# Patient Record
Sex: Male | Born: 1950 | Race: White | Hispanic: No | Marital: Married | Smoking: Current some day smoker
Health system: Southern US, Community
[De-identification: ages and names within clinical notes are randomized; demographics above are authoritative.]

## PROBLEM LIST (undated history)

## (undated) DIAGNOSIS — R0602 Shortness of breath: Secondary | ICD-10-CM

## (undated) DIAGNOSIS — F419 Anxiety disorder, unspecified: Secondary | ICD-10-CM

## (undated) DIAGNOSIS — G4733 Obstructive sleep apnea (adult) (pediatric): Secondary | ICD-10-CM

## (undated) DIAGNOSIS — I10 Essential (primary) hypertension: Secondary | ICD-10-CM

## (undated) DIAGNOSIS — E785 Hyperlipidemia, unspecified: Secondary | ICD-10-CM

## (undated) DIAGNOSIS — F32A Depression, unspecified: Secondary | ICD-10-CM

## (undated) DIAGNOSIS — R739 Hyperglycemia, unspecified: Secondary | ICD-10-CM

## (undated) DIAGNOSIS — R1310 Dysphagia, unspecified: Secondary | ICD-10-CM

## (undated) DIAGNOSIS — M549 Dorsalgia, unspecified: Secondary | ICD-10-CM

## (undated) DIAGNOSIS — R7303 Prediabetes: Secondary | ICD-10-CM

## (undated) DIAGNOSIS — K219 Gastro-esophageal reflux disease without esophagitis: Secondary | ICD-10-CM

## (undated) DIAGNOSIS — G4730 Sleep apnea, unspecified: Secondary | ICD-10-CM

## (undated) HISTORY — PX: HERNIA REPAIR: SHX51

## (undated) HISTORY — DX: Prediabetes: R73.03

## (undated) HISTORY — DX: Hyperglycemia, unspecified: R73.9

## (undated) HISTORY — DX: Gastro-esophageal reflux disease without esophagitis: K21.9

## (undated) HISTORY — DX: Obstructive sleep apnea (adult) (pediatric): G47.33

## (undated) HISTORY — DX: Dorsalgia, unspecified: M54.9

## (undated) HISTORY — PX: OTHER SURGICAL HISTORY: SHX169

## (undated) HISTORY — DX: Dysphagia, unspecified: R13.10

## (undated) HISTORY — DX: Hyperlipidemia, unspecified: E78.5

## (undated) HISTORY — DX: Depression, unspecified: F32.A

## (undated) HISTORY — DX: Sleep apnea, unspecified: G47.30

## (undated) HISTORY — DX: Essential (primary) hypertension: I10

## (undated) HISTORY — DX: Shortness of breath: R06.02

## (undated) HISTORY — DX: Anxiety disorder, unspecified: F41.9

---

## 2009-04-29 ENCOUNTER — Emergency Department (HOSPITAL_COMMUNITY): Admission: EM | Admit: 2009-04-29 | Discharge: 2009-04-30 | Payer: Self-pay | Admitting: Emergency Medicine

## 2011-03-08 LAB — POCT I-STAT, CHEM 8
Glucose, Bld: 111 mg/dL — ABNORMAL HIGH (ref 70–99)
HCT: 47 % (ref 39.0–52.0)
Hemoglobin: 16 g/dL (ref 13.0–17.0)
Potassium: 3 mEq/L — ABNORMAL LOW (ref 3.5–5.1)

## 2011-09-30 DEATH — deceased

## 2014-09-12 ENCOUNTER — Ambulatory Visit (INDEPENDENT_AMBULATORY_CARE_PROVIDER_SITE_OTHER): Payer: BC Managed Care – PPO | Admitting: Neurology

## 2014-09-12 ENCOUNTER — Encounter: Payer: Self-pay | Admitting: Neurology

## 2014-09-12 VITALS — BP 127/80 | HR 77 | Ht 72.0 in | Wt 247.0 lb

## 2014-09-12 DIAGNOSIS — E669 Obesity, unspecified: Secondary | ICD-10-CM

## 2014-09-12 DIAGNOSIS — G4733 Obstructive sleep apnea (adult) (pediatric): Secondary | ICD-10-CM

## 2014-09-12 DIAGNOSIS — R351 Nocturia: Secondary | ICD-10-CM

## 2014-09-12 DIAGNOSIS — G478 Other sleep disorders: Secondary | ICD-10-CM

## 2014-09-12 NOTE — Progress Notes (Signed)
Subjective:    Patient ID: Roy Avila is a 63 y.o. male.  HPI    Huston FoleySaima Avila Krisher, MD, PhD [facility] [address], [address] [address] [place], [place] [address]  Dear Roy LongsJoseph,  Also your patient, Roy EarlJeffrey Avila, upon your kind request in my neurologic clinic today for initial consultation of his sleep disorder, in particular, concern for underlying obstructive sleep apnea. The patient is unaccompanied today. As you know, Mr. Roy Avila is a 63 year old right-handed gentleman with an underlying medical history of obesity, HTN, GER, and anxiety, who was noted to have apneic spells during his colonoscopy procedure recently, earlier this month. He does report a long-standing history of loud snoring. He does not always wake up rested and has some daytime tiredness and sometimes takes a nap. He has a family history of loud snoring. He has a cousin with long-standing history of sleep apnea on a CPAP machine. He is somewhat reluctant to consider a CPAP machine himself but would be open to treatment for his sleep apnea. He has nocturia once or twice per night. He goes to bed around 11:30 and usually falls asleep quickly. He wakes up between 7 and 9 AM. He denies any morning headaches, restless leg symptoms or twitching in his sleep. He has occasional reflux symptoms and rare anxiety issues for which he takes as needed Xanax. He would like to consider home sleep test over a lab attended sleep study. His Epworth sleepiness score is 9 today. He is trying to lose weight and has been able to lose several pounds so far.  His Past Medical History Is Significant For: Past Medical History  Diagnosis Date  . Sleep apnea   . Hypertension     His Past Surgical History Is Significant For: Past Surgical History  Procedure Laterality Date  . None      His Family History Is Significant For: Family History  Problem Relation Age of Onset  . Breast cancer Mother   . Hypertension Mother   .  Heart disease Father   . Hypertension Father     His Social History Is Significant For: History   Social History  . Marital Status: Married    Spouse Name: N/A    Number of Children: 2  . Years of Education: college 4   Occupational History  . Retired   . Sales    Social History Main Topics  . Smoking status: Current Some Day Smoker -- 1.00 packs/day for 30 years    Types: Cigarettes  . Smokeless tobacco: Never Used  . Alcohol Use: Yes     Comment: socially  . Drug Use: No  . Sexual Activity: None   Other Topics Concern  . None   Social History Narrative  . None    His Allergies Are:  No Known Allergies:   His Current Medications Are:  Outpatient Encounter Prescriptions as of 09/12/2014  Medication Sig  . ALPRAZolam (XANAX) 0.5 MG tablet Take 0.5 mg by mouth as needed.  Marland Kitchen. amLODipine (NORVASC) 5 MG tablet Take 5 mg by mouth daily.  Marland Kitchen. BENICAR HCT 40-25 MG per tablet Take 1 tablet by mouth daily.  Marland Kitchen. doxycycline (VIBRAMYCIN) 100 MG capsule Take 100 mg by mouth as needed.  Marland Kitchen. NEXIUM 40 MG capsule Take 40 mg by mouth daily.  Marland Kitchen. ZOSTAVAX [id number] UNT/0.65ML injection Inject 0.05 Units into the skin once.  :  Review of Systems:  Out of a complete 14 point review of systems, all are reviewed  and negative with the exception of these symptoms as listed below:   Review of Systems  Constitutional:       Snoring, ringing of ears.    Objective:  Neurologic Exam  Physical Exam Physical Examination:   Filed Vitals:   09/12/14 1112  BP: 127/80  Pulse: 77    General Examination: The patient is a very pleasant 63 y.o. male in no acute distress. He appears well-developed and well-nourished and well groomed.   HEENT: Normocephalic, atraumatic, pupils are equal, round and reactive to light and accommodation. Funduscopic exam is normal with sharp disc margins noted. Extraocular tracking is good without limitation to gaze excursion or nystagmus noted. Normal smooth pursuit is  noted. Hearing is grossly intact. Tympanic membranes are clear bilaterally. Face is symmetric with normal facial animation and normal facial sensation. Speech is clear with no dysarthria noted. There is no hypophonia. There is no lip, neck/head, jaw or voice tremor. Neck is supple with full range of passive and active motion. There are no carotid bruits on auscultation. Oropharynx exam reveals: mild mouth dryness, adequate dental hygiene and marked airway crowding, due to narrow airway, thick soft palate, tonsils in place, and redundant soft palate. I could not visualize the tip of his uvula. Mallampati is class III. Tongue protrudes centrally and palate elevates symmetrically. Tonsils are 1+ in size. Neck size is 18 3/8 inches. He has a Moderate overbite. Nasal inspection reveals no significant nasal mucosal bogginess or redness and no septal deviation.   Chest: Clear to auscultation without wheezing, rhonchi or crackles noted.  Heart: S1+S2+0, regular and normal without murmurs, rubs or gallops noted.   Abdomen: Soft, non-tender and non-distended with normal bowel sounds appreciated on auscultation.  Extremities: There is no pitting edema in the distal lower extremities bilaterally. Pedal pulses are intact.  Skin: Warm and dry without trophic changes noted. There are no varicose veins.  Musculoskeletal: exam reveals no obvious joint deformities, tenderness or joint swelling or erythema.   Neurologically:  Mental status: The patient is awake, alert and oriented in all 4 spheres. His immediate and remote memory, attention, language skills and fund of knowledge are appropriate. There is no evidence of aphasia, agnosia, apraxia or anomia. Speech is clear with normal prosody and enunciation. Thought process is linear. Mood is normal and affect is normal.  Cranial nerves II - XII are as described above under HEENT exam. In addition: shoulder shrug is normal with equal shoulder height noted. Motor exam:  Normal bulk, strength and tone is noted. There is no drift, tremor or rebound. Romberg is negative. Reflexes are 2+ throughout. Babinski: Toes are flexor bilaterally. Fine motor skills and coordination: intact with normal finger taps, normal hand movements, normal rapid alternating patting, normal foot taps and normal foot agility.  Cerebellar testing: No dysmetria or intention tremor on finger to nose testing. Heel to shin is unremarkable bilaterally. There is no truncal or gait ataxia.  Sensory exam: intact to light touch, pinprick, vibration, temperature sense in the upper and lower extremities.  Gait, station and balance: He stands easily. No veering to one side is noted. No leaning to one side is noted. Posture is age-appropriate and stance is narrow based. Gait shows normal stride length and normal pace. No problems turning are noted. He turns en bloc. Tandem walk is unremarkable. Intact toe and heel stance is noted.               Assessment and Plan:   In summary, Demba  Roy Avila is a very pleasant 63 y.o.-year old male  with an underlying medical history of obesity, HTN, GER, and anxiety, who reports a long-standing history of loud snoring, nonrestorative sleep, some daytime tiredness, nocturia and witnessed apneic spells. His history and physical exam are highly concerning for obstructive sleep apnea (OSA). I had a long chat with the patient about my findings and the diagnosis of OSA, its prognosis and treatment options. We talked about medical treatments, surgical interventions and non-pharmacological approaches. I explained in particular the risks and ramifications of untreated moderate to severe OSA, especially with respect to developing cardiovascular disease down the Road, including congestive heart failure, difficult to treat hypertension, cardiac arrhythmias, or stroke. Even type 2 diabetes has, in part, been linked to untreated OSA. Symptoms of untreated OSA include daytime sleepiness, memory  problems, mood irritability and mood disorder such as depression and anxiety, lack of energy, as well as recurrent headaches, especially morning headaches. We talked about trying to maintain a healthy lifestyle in general, as well as the importance of weight control. I encouraged the patient to eat healthy, exercise daily and keep well hydrated, to keep a scheduled bedtime and wake time routine, to not skip any meals and eat healthy snacks in between meals. I advised the patient not to drive when feeling sleepy. I recommended the following at this time: sleep study, and the patient would like to proceed with a home sleep test. While I would prefer a lab attended sleep study, we will certainly accommodate him and I described a home sleep test procedure to him. I explained the sleep test procedure to the patient and also outlined possible surgical and non-surgical treatment options of OSA, including the use of a custom-made dental device (which would require a referral to a specialist dentist or oral surgeon), upper airway surgical options, such as pillar implants, radiofrequency surgery, tongue base surgery, and UPPP (which would involve a referral to an ENT surgeon). Rarely, jaw surgery such as mandibular advancement may be considered.  I also explained the CPAP treatment option to the patient, who indicated that he would be reluctant to try CPAP but not unwilling to try CPAP if the need arises. he would prefer an oral appliance if possible.  I explained the importance of being compliant with PAP treatment, not only for insurance purposes but primarily to improve His symptoms, and for the patient's long term health benefit, including to reduce His cardiovascular risks. I answered all  his  questions today and the patient was in agreement. I would like to see  him  back after the sleep study is completed and encouraged  him  to call with any interim questions, concerns, problems or updates.   Thank you very  much for allowing me to participate in the care of this nice patient. If I can be of any further assistance to you please do not hesitate to call me at [phone].  Sincerely,   Huston FoleySaima Jody Aguinaga, MD, PhD

## 2014-09-12 NOTE — Patient Instructions (Signed)
Based on your symptoms and your exam I believe you are at significant risk for obstructive sleep apnea or OSA, and I think we should proceed with a home sleep study to determine whether you do or do not have OSA and how severe it is. If you have more than mild OSA, I want you to consider treatment with CPAP. Please remember, the risks and ramifications of moderate to severe obstructive sleep apnea or OSA are: Cardiovascular disease, including congestive heart failure, stroke, difficult to control hypertension, arrhythmias, and even type 2 diabetes has been linked to untreated OSA. Sleep apnea causes disruption of sleep and sleep deprivation in most cases, which, in turn, can cause recurrent headaches, problems with memory, mood, concentration, focus, and vigilance. Most people with untreated sleep apnea report excessive daytime sleepiness, which can affect their ability to drive. Please do not drive if you feel sleepy.  I will see you back after your sleep study to go over the test results and where to go from there. We will call you after your sleep study and to set up an appointment at the time.

## 2014-09-13 ENCOUNTER — Telehealth: Payer: Self-pay | Admitting: *Deleted

## 2014-09-13 NOTE — Telephone Encounter (Signed)
Ofv note faxed per Dr. Teofilo PodAthar's request.  Successful transmission.

## 2014-09-30 ENCOUNTER — Ambulatory Visit (INDEPENDENT_AMBULATORY_CARE_PROVIDER_SITE_OTHER): Payer: BC Managed Care – PPO | Admitting: Neurology

## 2014-09-30 DIAGNOSIS — G4733 Obstructive sleep apnea (adult) (pediatric): Secondary | ICD-10-CM

## 2014-09-30 DIAGNOSIS — G478 Other sleep disorders: Secondary | ICD-10-CM

## 2014-09-30 DIAGNOSIS — R351 Nocturia: Secondary | ICD-10-CM

## 2014-09-30 DIAGNOSIS — E669 Obesity, unspecified: Secondary | ICD-10-CM

## 2014-09-30 NOTE — Sleep Study (Signed)
Please see the scanned sleep study interpretation located in the Procedure tab within the Chart Review section. 

## 2014-10-04 ENCOUNTER — Telehealth: Payer: Self-pay | Admitting: Neurology

## 2014-10-04 DIAGNOSIS — G4733 Obstructive sleep apnea (adult) (pediatric): Secondary | ICD-10-CM

## 2014-10-04 NOTE — Telephone Encounter (Signed)
Please call and notify the patient that the recent home sleep test did suggest the diagnosis of moderate obstructive sleep apnea and that I recommend treatment for this in the form of CPAP. I will request an overnight sleep study for proper titration and mask fitting. Please explain to patient and arrange for a CPAP titration study. I have placed an order in the chart. Thanks, Abrahim Sargent, MD, PhD [facility] ([facility])  

## 2014-10-22 NOTE — Telephone Encounter (Signed)
Contacted patient and informed him of his sleep study results.  Patient scheduled his in lab CPAP titration study for Wednesday January 13th at 09:30 pm.  Patient had previously been mailed a copy of the results.

## 2014-12-11 ENCOUNTER — Ambulatory Visit (INDEPENDENT_AMBULATORY_CARE_PROVIDER_SITE_OTHER): Payer: BLUE CROSS/BLUE SHIELD | Admitting: Neurology

## 2014-12-11 VITALS — BP 143/87

## 2014-12-11 DIAGNOSIS — G4761 Periodic limb movement disorder: Secondary | ICD-10-CM

## 2014-12-11 DIAGNOSIS — G4733 Obstructive sleep apnea (adult) (pediatric): Secondary | ICD-10-CM

## 2014-12-11 NOTE — Sleep Study (Signed)
Please see the scanned sleep study interpretation located in the Procedure tab within the Chart Review section. 

## 2014-12-18 ENCOUNTER — Telehealth: Payer: Self-pay | Admitting: Neurology

## 2014-12-18 DIAGNOSIS — G4733 Obstructive sleep apnea (adult) (pediatric): Secondary | ICD-10-CM

## 2014-12-19 ENCOUNTER — Encounter: Payer: Self-pay | Admitting: *Deleted

## 2014-12-19 ENCOUNTER — Encounter: Payer: Self-pay | Admitting: Neurology

## 2014-12-19 NOTE — Telephone Encounter (Signed)
Patient was contacted and informed that the results of his CPAP titration study.  The patient was advised that CPAP therapy was effective in treating his obstructive sleep apnea.  Informed that treatment with CPAP use at home was advised.  The patient was in agreement and was [facility].  Patient was mailed a copy of his report.   Patient instructed to contact our office 6-8 weeks post set up to schedule a follow up appointment.

## 2014-12-19 NOTE — Telephone Encounter (Signed)
Please call and inform patient that I have entered an order for treatment with PAP. He did well during the latest sleep study with CPAP. We will, therefore, arrange for a machine for home use through a DME (durable medical equipment) company of His choice; and I will see the patient back in follow-up in about 6 weeks. Please also explain to the patient that I will be looking out for compliance data downloaded from the machine, which can be done remotely through a modem at times or stored on an SD card in the back of the machine. At the time of the followup appointment we will discuss sleep study results and how it is going with PAP treatment at home. Please advise patient to bring His machine at the time of the visit; at least for the first visit, even though this is cumbersome. Bringing the machine for every visit after that may not be needed, but often helps for the first visit. Please also make sure, the patient has a follow-up appointment with me in about 6 weeks from the setup date, thanks.   Nalia Honeycutt, MD, PhD [facility] ([facility]  

## 2015-02-10 ENCOUNTER — Encounter: Payer: Self-pay | Admitting: Neurology

## 2016-08-12 LAB — TSH: TSH: 2.55 u[IU]/mL (ref <=5.90)

## 2016-08-12 LAB — HEPATIC FUNCTION PANEL
ALK PHOS: 74 U/L (ref 25–125)
ALT: 78 U/L — AB (ref 10–40)
AST: 56 U/L — AB (ref 14–40)
Bilirubin, Total: 0.6 mg/dL

## 2016-08-12 LAB — BASIC METABOLIC PANEL
BUN: 12 mg/dL (ref 4–21)
Creatinine: 0.8 mg/dL (ref <=1.3)
GLUCOSE: 99 mg/dL
POTASSIUM: 4.3 mmol/L (ref 3.4–5.3)
Sodium: 142 mmol/L (ref 137–147)

## 2016-08-12 LAB — LIPID PANEL
HDL: 31 mg/dL — AB (ref 35–70)
LDL Cholesterol: 114 mg/dL
LDl/HDL Ratio: 3.7
TRIGLYCERIDES: 227 mg/dL — AB (ref 40–160)

## 2016-08-12 LAB — CBC AND DIFFERENTIAL
HCT: 44 % (ref 41–53)
HEMOGLOBIN: 14.4 g/dL (ref 13.5–17.5)
PLATELETS: 137 10*3/uL — AB (ref 150–399)
WBC: 8.6 10*3/mL

## 2016-08-12 LAB — HEMOGLOBIN A1C: Hemoglobin A1C: 6.3

## 2016-08-12 LAB — PSA: PSA: 0.26

## 2016-12-30 DIAGNOSIS — H6521 Chronic serous otitis media, right ear: Secondary | ICD-10-CM | POA: Insufficient documentation

## 2016-12-30 DIAGNOSIS — H90A31 Mixed conductive and sensorineural hearing loss, unilateral, right ear with restricted hearing on the contralateral side: Secondary | ICD-10-CM | POA: Insufficient documentation

## 2017-01-20 ENCOUNTER — Encounter: Payer: Self-pay | Admitting: Family Medicine

## 2017-01-20 ENCOUNTER — Ambulatory Visit (INDEPENDENT_AMBULATORY_CARE_PROVIDER_SITE_OTHER): Payer: Self-pay | Admitting: Family Medicine

## 2017-01-20 VITALS — BP 130/78 | HR 74 | Temp 98.1°F | Ht 72.0 in | Wt 256.6 lb

## 2017-01-20 DIAGNOSIS — I10 Essential (primary) hypertension: Secondary | ICD-10-CM

## 2017-01-20 DIAGNOSIS — R7301 Impaired fasting glucose: Secondary | ICD-10-CM

## 2017-01-20 DIAGNOSIS — E669 Obesity, unspecified: Secondary | ICD-10-CM

## 2017-01-20 DIAGNOSIS — R351 Nocturia: Secondary | ICD-10-CM

## 2017-01-20 DIAGNOSIS — R7989 Other specified abnormal findings of blood chemistry: Secondary | ICD-10-CM

## 2017-01-20 DIAGNOSIS — E1159 Type 2 diabetes mellitus with other circulatory complications: Secondary | ICD-10-CM | POA: Insufficient documentation

## 2017-01-20 DIAGNOSIS — K219 Gastro-esophageal reflux disease without esophagitis: Secondary | ICD-10-CM

## 2017-01-20 DIAGNOSIS — F17200 Nicotine dependence, unspecified, uncomplicated: Secondary | ICD-10-CM

## 2017-01-20 DIAGNOSIS — E785 Hyperlipidemia, unspecified: Secondary | ICD-10-CM

## 2017-01-20 DIAGNOSIS — G4733 Obstructive sleep apnea (adult) (pediatric): Secondary | ICD-10-CM

## 2017-01-20 DIAGNOSIS — R059 Cough, unspecified: Secondary | ICD-10-CM

## 2017-01-20 DIAGNOSIS — R945 Abnormal results of liver function studies: Secondary | ICD-10-CM

## 2017-01-20 DIAGNOSIS — F419 Anxiety disorder, unspecified: Secondary | ICD-10-CM

## 2017-01-20 DIAGNOSIS — R05 Cough: Secondary | ICD-10-CM

## 2017-01-20 DIAGNOSIS — E66811 Obesity, class 1: Secondary | ICD-10-CM

## 2017-01-20 MED ORDER — PREDNISONE 5 MG PO TABS: ORAL_TABLET | ORAL | 0 refills | Status: DC

## 2017-01-20 MED ORDER — AZITHROMYCIN 250 MG PO TABS: ORAL_TABLET | ORAL | 0 refills | Status: DC

## 2017-01-20 MED ORDER — GUAIFENESIN-CODEINE 100-10 MG/5ML PO SYRP: 10.0000 mL | ORAL_SOLUTION | Freq: Every evening | ORAL | 0 refills | Status: DC | PRN

## 2017-01-20 NOTE — Progress Notes (Signed)
Pre visit review using our clinic review tool, if applicable. No additional management support is needed unless otherwise documented below in the visit note. 

## 2017-01-20 NOTE — Progress Notes (Signed)
Roy Avila is a 66 y.o. male is here to [facility].   History of Present Illness:    1. Cough: Patient complains of productive cough with sputum described as thick and green, associated with wheezing. Symptoms began 5 days ago. Symptoms have been gradually worsening since that time.The cough is aggravated by reclining position. Associated symptoms include: postnasal drip and wheezing. Patient does have a history of smoking. Patient has not had a previous chest x-ray.     2. Smoker. Not every day.   3. Obstructive sleep apnea syndrome. Compliant.    Health Maintenance Due  Topic Date Due  . TETANUS/TDAP  05/01/1970     PMHx, SurgHx, SocialHx, Medications, and Allergies were reviewed in the Visit Navigator and updated as appropriate.    Past Medical History:  Diagnosis Date  . Hypertension   . Sleep apnea     Past Surgical History:  Procedure Laterality Date  . None      Family History  Problem Relation Age of Onset  . Breast cancer Mother   . Hypertension Mother   . Heart disease Father   . Hypertension Father     Social History  Substance Use Topics  . Smoking status: Current Some Day Smoker    Years: 30.00    Types: Cigarettes  . Smokeless tobacco: Never Used     Comment: Social smoker, 1 pack per week  . Alcohol use Yes     Comment: socially     Current Medications and Allergies:    Current Outpatient Prescriptions:  .  ALPRAZolam (XANAX) 0.5 MG tablet, Take 0.5 mg by mouth as needed., Disp: , Rfl:  .  amLODipine (NORVASC) 5 MG tablet, Take 5 mg by mouth daily., Disp: , Rfl:  .  BENICAR HCT 40-25 MG per tablet, Take 1 tablet by mouth daily., Disp: , Rfl:  .  NEXIUM 40 MG capsule, Take 40 mg by mouth daily., Disp: , Rfl:    No Known Allergies    Patient Information Form: Screening and ROS    Do you feel safe in relationships? yes PHQ-2: negative  Review of Systems  General:  Negative for unexplained weight loss, fever Skin:  Negative for new or changing mole, sore that won't heal HEENT: Negative for trouble hearing, trouble seeing, ringing in ears, mouth sores, hoarseness, change in voice, dysphagia CV:  Negative for chest pain, dyspnea, edema, palpitations Resp: Negative for cough, dyspnea, hemoptysis GI: Negative for nausea, vomiting, diarrhea, constipation, abdominal pain, melena, hematochezia GU: Negative for dysuria, incontinence, urinary hesitance, hematuria, vaginal or penile discharge, polyuria, sexual difficulty, lumps in testicle or breasts MSK: Negative for muscle cramps or aches, joint pain or swelling Neuro: Negative for headaches, weakness, numbness, dizziness, passing out/fainting Psych: Negative for depression, anxiety, memory problems   Vitals:   Vitals:   01/20/17 1503  BP: 130/78  Pulse: 74  Temp: 98.1 F (36.7 C)  TempSrc: Oral  SpO2: 96%  Weight: 256 lb 9.6 oz (116.4 kg)  Height: 6' (1.829 m)     Body mass index is 34.8 kg/m.   Physical Exam:     General: Alert, cooperative, appears stated age and no distress.  HEENT:  Normocephalic, without obvious abnormality, atraumatic. Conjunctivae/corneas clear. PERRL, EOM's intact. Normal TM's and external ear canals both ears. Nares normal. Septum midline. Mucosa normal. No drainage or sinus tenderness. Lips, mucosa, and tongue normal; teeth and gums normal.  Lungs: Central lung congestion, cough, diffuse wheeze bilaterally.  Heart:: Regular rate  and rhythm, S1, S2 normal, no murmur, click, rub or gallop.  Abdomen: Soft, non-tender; bowel sounds normal; no masses,  no organomegaly.  Extremities: Extremities normal, atraumatic, no cyanosis or edema.  Pulses: 2+ and symmetric.  Skin: Skin color, texture, turgor normal. No rashes or lesions.  Neurologic: Alert and oriented X 3, normal strength and tone. Normal symmetric. reflexes. Normal coordination and gait.  Psych: Alert,oriented, in NAD with a full range of affect, normal behavior  and no psychotic features      Assessment and Plan:    Roy Avila was seen today for establish care and cough.  Diagnoses and all orders for this visit:  Gastroesophageal reflux disease without esophagitis  Cough Comments: c/w bronchitis in smoker.  Orders: -     predniSONE (DELTASONE) 5 MG tablet; 6-5-4-3-2-1-off -     guaiFENesin-codeine (CHERATUSSIN AC) 100-10 MG/5ML syrup; Take 10 mLs by mouth at bedtime as needed for cough or congestion. -     azithromycin (ZITHROMAX Z-PAK) 250 MG tablet; Take 2 tablets ( total of 500 mg) PO on day 1, then 1 tablet ( total of 250 mg) PO q24 x 4 days. (SAFETY NET)  HTN, goal below 140/80  Obesity (BMI 30.0-34.9)  Anxiety  Smoker       - Smoking cessation instruction/counseling given: counseled patient on the dangers of tobacco use, advised patient to stop smoking, and reviewed strategies to maximize success.  Obstructive sleep apnea syndrome   . Reviewed expectations re: course of current medical issues. . Discussed self-management of symptoms. . Outlined signs and symptoms indicating need for more acute intervention. . Patient verbalized understanding and all questions were answered. . See orders for this visit as documented in the electronic medical record. . Patient received an After Visit Summary.  Records requested if needed. I spent 30 minutes with this patient, greater than 50% was face-to-face time counseling regarding the above diagnoses.    Helane RimaErica Tristin Vandeusen, D.O. [place] [place] 01/20/2017   Follow-up: 3-6 months.  Meds ordered this encounter  Medications  . azithromycin (ZITHROMAX Z-PAK) 250 MG tablet    Sig: Take 2 tablets ( total of 500 mg) PO on day 1, then 1 tablet ( total of 250 mg) PO q24 x 4 days.    Dispense:  6 each    Refill:  0  . predniSONE (DELTASONE) 5 MG tablet    Sig: 6-5-4-3-2-1-off    Dispense:  21 tablet    Refill:  0  . guaiFENesin-codeine (CHERATUSSIN AC) 100-10 MG/5ML syrup    Sig:  Take 10 mLs by mouth at bedtime as needed for cough or congestion.    Dispense:  120 mL    Refill:  0   Medications Discontinued During This Encounter  Medication Reason  . doxycycline (VIBRAMYCIN) 100 MG capsule Error  . ZOSTAVAX [id number] UNT/0.65ML injection Error   No orders of the defined types were placed in this encounter.

## 2017-01-22 DIAGNOSIS — G4733 Obstructive sleep apnea (adult) (pediatric): Secondary | ICD-10-CM

## 2017-01-22 DIAGNOSIS — E669 Obesity, unspecified: Secondary | ICD-10-CM | POA: Insufficient documentation

## 2017-01-22 DIAGNOSIS — Z9989 Dependence on other enabling machines and devices: Secondary | ICD-10-CM

## 2017-01-22 HISTORY — DX: Obstructive sleep apnea (adult) (pediatric): G47.33

## 2017-01-27 ENCOUNTER — Encounter: Payer: Self-pay | Admitting: Family Medicine

## 2017-01-27 DIAGNOSIS — R7301 Impaired fasting glucose: Secondary | ICD-10-CM | POA: Insufficient documentation

## 2017-01-27 DIAGNOSIS — R351 Nocturia: Secondary | ICD-10-CM | POA: Insufficient documentation

## 2017-01-27 DIAGNOSIS — R7989 Other specified abnormal findings of blood chemistry: Secondary | ICD-10-CM | POA: Insufficient documentation

## 2017-01-27 DIAGNOSIS — R945 Abnormal results of liver function studies: Secondary | ICD-10-CM

## 2017-01-27 DIAGNOSIS — E785 Hyperlipidemia, unspecified: Secondary | ICD-10-CM | POA: Insufficient documentation

## 2017-01-27 HISTORY — DX: Other specified abnormal findings of blood chemistry: R79.89

## 2017-01-27 HISTORY — DX: Abnormal results of liver function studies: R94.5

## 2017-03-07 ENCOUNTER — Other Ambulatory Visit: Payer: Self-pay

## 2017-03-07 MED ORDER — AMLODIPINE BESYLATE 5 MG PO TABS: 5.0000 mg | ORAL_TABLET | Freq: Every day | ORAL | 1 refills | Status: DC

## 2017-03-07 MED ORDER — ESOMEPRAZOLE MAGNESIUM 40 MG PO CPDR: 40.0000 mg | DELAYED_RELEASE_CAPSULE | Freq: Every day | ORAL | 1 refills | Status: DC

## 2017-06-27 ENCOUNTER — Ambulatory Visit (INDEPENDENT_AMBULATORY_CARE_PROVIDER_SITE_OTHER): Payer: Medicare Other

## 2017-06-27 ENCOUNTER — Encounter: Payer: Self-pay | Admitting: Family Medicine

## 2017-06-27 ENCOUNTER — Ambulatory Visit (INDEPENDENT_AMBULATORY_CARE_PROVIDER_SITE_OTHER): Payer: Medicare Other | Admitting: Family Medicine

## 2017-06-27 VITALS — BP 160/84 | HR 82 | Temp 97.9°F | Ht 72.0 in | Wt 257.4 lb

## 2017-06-27 DIAGNOSIS — R05 Cough: Secondary | ICD-10-CM | POA: Diagnosis not present

## 2017-06-27 DIAGNOSIS — R059 Cough, unspecified: Secondary | ICD-10-CM

## 2017-06-27 DIAGNOSIS — Z87891 Personal history of nicotine dependence: Secondary | ICD-10-CM | POA: Diagnosis not present

## 2017-06-27 DIAGNOSIS — R0989 Other specified symptoms and signs involving the circulatory and respiratory systems: Secondary | ICD-10-CM

## 2017-06-27 MED ORDER — AZITHROMYCIN 250 MG PO TABS: ORAL_TABLET | ORAL | 0 refills | Status: DC

## 2017-06-27 NOTE — Patient Instructions (Signed)
Please start antibiotic today- take two tablets today, then one a day until finished  For cough can use Delsym  Continue Mucinex  If you develop difficulty breathing or chest pain, please call 911.   If not better in 5-7 days, please notify office  You will get called about your xray results

## 2017-06-27 NOTE — Progress Notes (Signed)
Subjective:    Patient ID: Roy Avila, male    DOB: 11/09/1951, 66 y.o.   MRN: [id number]  HPI This is a 66 yo male who presents today with chest congestion x 1 week Having dark, thick sputum. No fever. A little SOB over last 2 days. Smokes only when he has a drink, 1-2 times a week. Has not been smoking recently. Some nasal congestion. Has had problems with right ear, sees Dr. Jenne PaneBates. Taking Ciprodex. Some drainage and pain. No headache or sore throat. Hearing wheezing intermittently. Is concerned about risk of lung cancer with his smoking history. Uses CPAP for OSA, sleeping well, cough not keeping him up.  Was recently on a cruise. Did well with excursions. Felt like he had a cold coming on prior to cruise, but then felt well on his trip.   Past Medical History:  Diagnosis Date  . Anxiety   . Hyperglycemia   . Hyperlipidemia   . Hypertension   . Sleep apnea    Past Surgical History:  Procedure Laterality Date  . HERNIA REPAIR    . none     Family History  Problem Relation Age of Onset  . Breast cancer Mother   . Hypertension Mother   . Heart disease Father   . Hypertension Father    Social History  Substance Use Topics  . Smoking status: Current Some Day Smoker    Years: 30.00    Types: Cigarettes  . Smokeless tobacco: Never Used     Comment: Social smoker, 1 pack per week  . Alcohol use Yes     Comment: socially      Review of Systems Per HPI    Objective:   Physical Exam  Constitutional: He is oriented to person, place, and time. He appears well-developed and well-nourished. No distress.  HENT:  Head: Normocephalic and atraumatic.  Right Ear: External ear normal.  Left Ear: Tympanic membrane, external ear and ear canal normal.  Nose: Mucosal edema and rhinorrhea present.  Mouth/Throat: Oropharynx is clear and moist.  Left canal mildly swollen and TM pale, thickened.   Eyes: Conjunctivae are normal.  Neck: Normal range of motion. Neck supple.    Cardiovascular: Normal rate, regular rhythm and normal heart sounds.   Pulmonary/Chest: Effort normal.  RLQ with rhonchi. No wheezes. Occasional moist cough.   Lymphadenopathy:    He has no cervical adenopathy.  Neurological: He is alert and oriented to person, place, and time.  Skin: Skin is warm and dry. He is not diaphoretic.  Psychiatric: He has a normal mood and affect. His behavior is normal. Judgment and thought content normal.  Vitals reviewed.     BP (!) 160/84 (BP Location: Right Arm, Patient Position: Sitting, Cuff Size: Normal)   Pulse 82   Temp 97.9 F (36.6 C) (Oral)   Ht 6' (1.829 m)   Wt 257 lb 6.4 oz (116.8 kg)   SpO2 95%   BMI 34.91 kg/m  Wt Readings from Last 3 Encounters:  06/27/17 257 lb 6.4 oz (116.8 kg)  01/20/17 256 lb 9.6 oz (116.4 kg)  09/12/14 247 lb (112 kg)   BP Readings from Last 3 Encounters:  06/27/17 (!) 160/84  01/20/17 130/78  12/11/14 (!) 143/87       Assessment & Plan:  1. Cough - concern for CAP with recent cruise and smoking history -RTC/ED precautions reviewed.  - DG Chest 2 View; Future - azithromycin (ZITHROMAX) 250 MG tablet; Take 2 tabs PO x 1  dose, then 1 tab PO QD x 4 days  Dispense: 6 tablet; Refill: 0  2. Chest congestion - DG Chest 2 View; Future - azithromycin (ZITHROMAX) 250 MG tablet; Take 2 tabs PO x 1 dose, then 1 tab PO QD x 4 days  Dispense: 6 tablet; Refill: 0  3. Smoking history - has not smoked in several weeks, encouraged him to quit. He is pleased with efforts so far.  - DG Chest 2 View; Future - azithromycin (ZITHROMAX) 250 MG tablet; Take 2 tabs PO x 1 dose, then 1 tab PO QD x 4 days  Dispense: 6 tablet; Refill: 0 - discussed limits of CXR for lung cancer screening and encouraged him to discuss screening chest CT for lung cancer.   Olean Reeeborah Gessner, FNP-BC  [facility] [place], [facility]  06/27/2017 9:02 PM

## 2017-07-18 ENCOUNTER — Telehealth: Payer: Self-pay | Admitting: Family Medicine

## 2017-07-18 MED ORDER — OLMESARTAN MEDOXOMIL-HCTZ 40-25 MG PO TABS: 1.0000 | ORAL_TABLET | Freq: Every day | ORAL | 2 refills | Status: DC

## 2017-07-18 NOTE — Telephone Encounter (Signed)
Left message for patient to return call.

## 2017-07-18 NOTE — Telephone Encounter (Signed)
MEDICATION: BENICAR HCT 40-25 MG per tablet  PHARMACY:  [facility] - [place], [place] - [address] [phone] (Phone) [phone] (Fax)    IS THIS A 90 DAY SUPPLY :yes   IS PATIENT OUT OF MEDICTAION: yes  IF NOT; HOW MUCH IS LEFT: n/a  LAST APPOINTMENT DATE:06/27/17  NEXT APPOINTMENT DATE:n/a  OTHER COMMENTS:    **Let patient know to contact pharmacy at the end of the day to make sure medication is ready. **  ** Please notify patient to allow 48-72 hours to process**  **Encourage patient to contact the pharmacy for refills or they can request refills through [facility]**

## 2017-07-18 NOTE — Telephone Encounter (Signed)
Spoke with lady at [facility] and they said that that was not needed to verify Dr. Earlene Plater was PCP. Medication that was sent in has already been filled.

## 2017-07-18 NOTE — Telephone Encounter (Signed)
Clinical staff, please call [facility] just to verify that Dr. Earlene Plater is the PCP.

## 2017-07-18 NOTE — Telephone Encounter (Signed)
Per Dr. Joellyn Rued to refill.

## 2017-08-29 ENCOUNTER — Encounter: Payer: Self-pay | Admitting: Internal Medicine

## 2017-09-22 ENCOUNTER — Other Ambulatory Visit: Payer: Self-pay | Admitting: Family Medicine

## 2017-10-05 ENCOUNTER — Encounter: Payer: Self-pay | Admitting: Family Medicine

## 2017-10-05 ENCOUNTER — Ambulatory Visit (INDEPENDENT_AMBULATORY_CARE_PROVIDER_SITE_OTHER): Payer: Medicare Other | Admitting: Family Medicine

## 2017-10-05 VITALS — BP 116/74 | HR 75 | Temp 98.4°F | Ht 72.0 in | Wt 247.8 lb

## 2017-10-05 DIAGNOSIS — E7800 Pure hypercholesterolemia, unspecified: Secondary | ICD-10-CM | POA: Diagnosis not present

## 2017-10-05 DIAGNOSIS — F419 Anxiety disorder, unspecified: Secondary | ICD-10-CM | POA: Diagnosis not present

## 2017-10-05 DIAGNOSIS — Z0000 Encounter for general adult medical examination without abnormal findings: Secondary | ICD-10-CM

## 2017-10-05 DIAGNOSIS — R39198 Other difficulties with micturition: Secondary | ICD-10-CM | POA: Diagnosis not present

## 2017-10-05 DIAGNOSIS — R945 Abnormal results of liver function studies: Secondary | ICD-10-CM | POA: Diagnosis not present

## 2017-10-05 DIAGNOSIS — E785 Hyperlipidemia, unspecified: Secondary | ICD-10-CM

## 2017-10-05 DIAGNOSIS — R7301 Impaired fasting glucose: Secondary | ICD-10-CM

## 2017-10-05 DIAGNOSIS — E669 Obesity, unspecified: Secondary | ICD-10-CM | POA: Diagnosis not present

## 2017-10-05 DIAGNOSIS — R7989 Other specified abnormal findings of blood chemistry: Secondary | ICD-10-CM

## 2017-10-05 LAB — LIPID PANEL
Cholesterol: 180 mg/dL (ref 0–200)
HDL: 27.5 mg/dL — ABNORMAL LOW (ref >39.00)
LDL Cholesterol: 129 mg/dL — ABNORMAL HIGH (ref 0–99)
NonHDL: 152.67
Total CHOL/HDL Ratio: 7
Triglycerides: 120 mg/dL (ref 0.0–149.0)
VLDL: 24 mg/dL (ref 0.0–40.0)

## 2017-10-05 LAB — COMPREHENSIVE METABOLIC PANEL
ALT: 52 U/L (ref 0–53)
AST: 35 U/L (ref 0–37)
Albumin: 4 g/dL (ref 3.5–5.2)
Alkaline Phosphatase: 70 U/L (ref 39–117)
BUN: 11 mg/dL (ref 6–23)
CO2: 30 mEq/L (ref 19–32)
Calcium: 9.5 mg/dL (ref 8.4–10.5)
Chloride: 99 mEq/L (ref 96–112)
Creatinine, Ser: 0.86 mg/dL (ref 0.40–1.50)
GFR: 94.73 mL/min (ref >60.00)
Glucose, Bld: 109 mg/dL — ABNORMAL HIGH (ref 70–99)
Potassium: 3.8 mEq/L (ref 3.5–5.1)
Sodium: 138 mEq/L (ref 135–145)
Total Bilirubin: 0.9 mg/dL (ref 0.2–1.2)
Total Protein: 7.7 g/dL (ref 6.0–8.3)

## 2017-10-05 LAB — CBC WITH DIFFERENTIAL/PLATELET
Basophils Absolute: 0.1 10*3/uL (ref 0.0–0.1)
Basophils Relative: 0.6 % (ref 0.0–3.0)
Eosinophils Absolute: 1.1 10*3/uL — ABNORMAL HIGH (ref 0.0–0.7)
Eosinophils Relative: 9.7 % — ABNORMAL HIGH (ref 0.0–5.0)
HCT: 43.9 % (ref 39.0–52.0)
Hemoglobin: 14.6 g/dL (ref 13.0–17.0)
Lymphocytes Relative: 16.6 % (ref 12.0–46.0)
Lymphs Abs: 1.9 10*3/uL (ref 0.7–4.0)
MCHC: 33.1 g/dL (ref 30.0–36.0)
MCV: 95.6 fl (ref 78.0–100.0)
Monocytes Absolute: 1 10*3/uL (ref 0.1–1.0)
Monocytes Relative: 9.2 % (ref 3.0–12.0)
Neutro Abs: 7.3 10*3/uL (ref 1.4–7.7)
Neutrophils Relative %: 63.9 % (ref 43.0–77.0)
Platelets: 375 10*3/uL (ref 150.0–400.0)
RBC: 4.59 Mil/uL (ref 4.22–5.81)
RDW: 14.2 % (ref 11.5–15.5)
WBC: 11.4 10*3/uL — ABNORMAL HIGH (ref 4.0–10.5)

## 2017-10-05 LAB — PSA: PSA: 0.19 ng/mL (ref 0.10–4.00)

## 2017-10-05 LAB — TSH: TSH: 1.62 u[IU]/mL (ref 0.35–4.50)

## 2017-10-05 LAB — HEMOGLOBIN A1C: Hgb A1c MFr Bld: 6.2 % (ref 4.6–6.5)

## 2017-10-05 MED ORDER — ALPRAZOLAM 0.5 MG PO TABS: 0.5000 mg | ORAL_TABLET | Freq: Two times a day (BID) | ORAL | 0 refills | Status: DC | PRN

## 2017-10-05 NOTE — Progress Notes (Signed)
Subjective:    Roy Avila is a 66 y.o. male who presents today for his Complete Annual Exam. He feels well. He reports exercising but not regularly. He reports he is sleeping well.   Health Maintenance Due  Topic Date Due  . PNA vac Low Risk Adult (1 of 2 - PCV13) 04/30/2017   Prostate Symptoms Questionnaire: 1. Have you had the sensation of not emptying your bladder completely after you finished urinating? No. 2. Have you had to urinate again less than two hours after you finished urinating? No. 3. Have you found you stopped and started again several times when you urinated? No. 4. Have you found it difficult to postpone urination? No. 5. Have you had a weak urinary stream? Yes.   6. Have you had to push or strain to begin urination? No. 7. How many times did you most typically get up to urinate from the time you went to bed at night until the time you got up in the morning? < 2  PMHx, SurgHx, SocialHx, Medications, and Allergies were reviewed in the Visit Navigator and updated as appropriate.   Past Medical History:  Diagnosis Date  . Anxiety   . Hyperglycemia   . Hyperlipidemia   . Hypertension   . Sleep apnea     Past Surgical History:  Procedure Laterality Date  . HERNIA REPAIR    . none      Family History  Problem Relation Age of Onset  . Breast cancer Mother   . Hypertension Mother   . Heart disease Father   . Hypertension Father    Social History   Tobacco Use  . Smoking status: Current Some Day Smoker    Years: 30.00    Types: Cigarettes  . Smokeless tobacco: Never Used  . Tobacco comment: Social smoker, 1 pack per week  Substance Use Topics  . Alcohol use: Yes    Comment: socially  . Drug use: No   Review of Systems:   Pertinent items are noted in the HPI. Otherwise, ROS is negative.  Objective:    Vitals:   10/05/17 0857  BP: 116/74  Pulse: 75  Temp: 98.4 F (36.9 C)  SpO2: 95%   Body mass index is 33.61 kg/m.  General  Alert,  cooperative, no distress, appears stated age  Head:  Normocephalic, without obvious abnormality, atraumatic  Eyes:  PERRL, conjunctiva/corneas clear, EOM's intact, fundi benign, both eyes       Ears:  Right TM with blue tympanostomy tube.  Nose: Nares normal, septum midline, mucosa normal, no drainage or sinus tenderness  Throat: Lips, mucosa, and tongue normal; teeth and gums normal  Neck: Supple, symmetrical, trachea midline, no adenopathy; thyroid: no enlargement/tenderness/nodules; no carotid bruit or JVD  Back:   Symmetric, no curvature, ROM normal, no CVA tenderness  Lungs:   Clear to auscultation bilaterally, respirations unlabored  Chest Wall:  No tenderness or deformity  Heart:  Regular rate and rhythm, S1 and S2 normal, no murmur, rub or gallop  Abdomen:   Soft, non-tender, bowel sounds active all four quadrants, no masses, no organomegaly  Extremities: Extremities normal, atraumatic, no cyanosis or edema  Prostate : Not done.  Skin: Skin color, texture, turgor normal, no rashes or lesions  Lymph: Cervical, supraclavicular, and axillary nodes normal  Neurologic: CNII-XII grossly intact. Normal strength, sensation and reflexes throughout   AssessmentPlan:   Diagnoses and all orders for this visit:  Routine physical examination -     CBC  with Differential/Platelet  Pure hypercholesterolemia -     Lipid panel  Obesity (BMI 30.0-34.9) -     TSH  Impaired fasting glucose -     Hemoglobin A1c  Elevated LFTs -     Comprehensive metabolic panel  Decreased urine stream -     PSA  Anxiety -     ALPRAZolam (XANAX) 0.5 MG tablet; Take 1 tablet (0.5 mg total) 2 (two) times daily as needed by mouth for anxiety.   Patient Counseling: [x]   Nutrition: Stressed importance of moderation in sodium/caffeine intake, saturated fat and cholesterol, caloric balance, sufficient intake of fresh fruits, vegetables, and fiber  [x]   Stressed the importance of regular exercise.   []    Substance Abuse: Discussed cessation/primary prevention of tobacco, alcohol, or other drug use; driving or other dangerous activities under the influence; availability of treatment for abuse.   [x]   Injury prevention: Discussed safety belts, safety helmets, smoke detector, smoking near bedding or upholstery.   []   Sexuality: Discussed sexually transmitted diseases, partner selection, use of condoms, avoidance of unintended pregnancy  and contraceptive alternatives.   [x]   Dental health: Discussed importance of regular tooth brushing, flossing, and dental visits.  [x]   Health maintenance and immunizations reviewed. Please refer to Health maintenance section.    Helane RimaErica Melena Hayes, DO [place] [facility]

## 2017-10-07 DIAGNOSIS — E785 Hyperlipidemia, unspecified: Secondary | ICD-10-CM | POA: Insufficient documentation

## 2017-10-07 MED ORDER — FENOFIBRATE 48 MG PO TABS: 48.0000 mg | ORAL_TABLET | Freq: Every day | ORAL | 3 refills | Status: DC

## 2017-10-07 NOTE — Addendum Note (Signed)
Addended by: Helane RimaWALLACE, Laurisa Sahakian R on: 10/07/2017 02:38 PM   Modules accepted: Orders

## 2017-12-21 ENCOUNTER — Other Ambulatory Visit: Payer: Self-pay | Admitting: Family Medicine

## 2018-03-13 ENCOUNTER — Ambulatory Visit: Payer: Self-pay

## 2018-03-13 NOTE — Telephone Encounter (Signed)
Pt. Called with c/o "vertigo."  Reported when he woke up both yesterday AM and this AM, he noticed moderate to severe vertigo; described that room was "tilting."  Reported after he stands up, the dizziness subsides. Also reported he was bending over to reach something on lower grocery shelf, and became dizzy.  Dizziness subsided once he stood up.  Also reported he had an episode when he was just sitting, and thought he may have turned head too quickly.  Reported c/o sinus congestion, and intermittent headache. Stated he has a tube in his right ear.  Denied any drainage, fever or chills.  Appt. scheduled for AM with PCP.  Care advice per protocol.  Verb. Understanding; agrees with plan.     Reason for Disposition . [1] MODERATE dizziness (e.g., vertigo; feels very unsteady, interferes with normal activities) AND [2] has NOT been evaluated by physician for this  Answer Assessment - Initial Assessment Questions 1. DESCRIPTION: "Describe your dizziness."     Room is tilting; improves with sitting up  2. VERTIGO: "Do you feel like either you or the room is spinning or tilting?"      Room tilting 3. LIGHTHEADED: "Do you feel lightheaded?" (e.g., somewhat faint, woozy, weak upon standing)     Dizzy like I was off balance  4. SEVERITY: "How bad is it?"  "Can you walk?"   - MILD - Feels unsteady but walking normally.   - MODERATE - Feels very unsteady when walking, but not falling; interferes with normal activities (e.g., school, work) .   - SEVERE - Unable to walk without falling (requires assistance).     Moderate-severe 5. ONSET:  "When did the dizziness begin?"     2 days ago 6. AGGRAVATING FACTORS: "Does anything make it worse?" (e.g., standing, change in head position)     Bending over or moving too fast 7. CAUSE: "What do you think is causing the dizziness?"     unsure 8. RECURRENT SYMPTOM: "Have you had dizziness before?" If so, ask: "When was the last time?" "What happened that time?"  Have never had this before.  9. OTHER SYMPTOMS: "Do you have any other symptoms?" (e.g., headache, weakness, numbness, vomiting, earache)     Sinus congestion, intermittent headache; reported a tube in right ear ; denied fever/ chills  10. PREGNANCY: "Is there any chance you are pregnant?" "When was your last menstrual period?"      N/a  Protocols used: DIZZINESS - VERTIGO-A-AH

## 2018-03-14 ENCOUNTER — Encounter: Payer: Self-pay | Admitting: Family Medicine

## 2018-03-14 ENCOUNTER — Ambulatory Visit: Payer: Medicare Other | Admitting: Family Medicine

## 2018-03-14 VITALS — BP 130/72 | HR 78 | Temp 97.6°F | Ht 72.0 in | Wt 243.0 lb

## 2018-03-14 DIAGNOSIS — R42 Dizziness and giddiness: Secondary | ICD-10-CM

## 2018-03-14 MED ORDER — MECLIZINE HCL 25 MG PO TABS
25.0000 mg | ORAL_TABLET | Freq: Three times a day (TID) | ORAL | 0 refills | Status: DC | PRN
Start: 2018-03-14 — End: 2018-04-11

## 2018-03-14 NOTE — Telephone Encounter (Signed)
Printed off and put with ppw for app today.

## 2018-03-14 NOTE — Patient Instructions (Signed)

## 2018-03-14 NOTE — Progress Notes (Signed)
Roy Avila is a 67 y.o. male here for an acute visit.  History of Present Illness:   [name], CMA acting as scribe for Dr. Helane Rima.   Dizziness  The current episode started in the past 7 days. The problem occurs intermittently. The problem has been resolved. Pertinent negatives include no abdominal pain, chills, congestion, fever, headaches, neck pain or vomiting. He has tried drinking (feels better arter increasing fluid ) for the symptoms. The treatment provided moderate relief.   PMHx, SurgHx, SocialHx, Medications, and Allergies were reviewed in the Visit Navigator and updated as appropriate.  Current Medications:   .  ALPRAZolam (XANAX) 0.5 MG tablet, Take 1 tablet (0.5 mg total) 2 (two) times daily as needed by mouth for anxiety., Disp: 20 tablet, Rfl: 0 .  amLODipine (NORVASC) 5 MG tablet, TAKE 1 TABLET DAILY (NEED FOLLOW-UP FOR FURTHER REFILLS), Disp: 90 tablet, Rfl: 1 .  esomeprazole (NEXIUM) 40 MG capsule, TAKE 1 CAPSULE DAILY (NEED FOLLOW-UP FOR FURTHER REFILLS), Disp: 90 capsule, Rfl: 1 .  olmesartan-hydrochlorothiazide (BENICAR HCT) 40-25 MG tablet, Take 1 tablet by mouth daily., Disp: 90 tablet, Rfl: 2   Allergies  Allergen Reactions  . Clarithromycin Rash     Review of Systems:   Pertinent items are noted in the HPI. Otherwise, ROS is negative.  Vitals:   Vitals:   03/14/18 1018  BP: 130/72  Pulse: 78  Temp: 97.6 F (36.4 C)  TempSrc: Oral  SpO2: 96%  Weight: 243 lb (110.2 kg)  Height: 6' (1.829 m)     Body mass index is 32.96 kg/m.  Physical Exam:   Physical Exam  Constitutional: He is oriented to person, place, and time. He appears well-developed and well-nourished. No distress.  HENT:  Head: Normocephalic and atraumatic.  Right Ear: External ear normal.  Left Ear: External ear normal.  Nose: Nose normal.  Mouth/Throat: Oropharynx is clear and moist.  Right TM with blue tube. No issues.  Eyes: Pupils are equal, round, and  reactive to light. Conjunctivae and EOM are normal.  Neck: Normal range of motion. Neck supple.  Cardiovascular: Normal rate, regular rhythm, normal heart sounds and intact distal pulses.  Pulmonary/Chest: Effort normal and breath sounds normal.  Abdominal: Soft. Bowel sounds are normal.  Musculoskeletal: Normal range of motion.  Neurological: He is alert and oriented to person, place, and time.  Skin: Skin is warm and dry.  Psychiatric: He has a normal mood and affect. His behavior is normal. Judgment and thought content normal.  Nursing note and vitals reviewed.   Results for orders placed or performed in visit on 10/05/17  CBC with Differential/Platelet  Result Value Ref Range   WBC 11.4 (H) 4.0 - 10.5 K/uL   RBC 4.59 4.22 - 5.81 Mil/uL   Hemoglobin 14.6 13.0 - 17.0 g/dL   HCT 16.1 09.6 - 04.5 %   MCV 95.6 78.0 - 100.0 fl   MCHC 33.1 30.0 - 36.0 g/dL   RDW 40.9 81.1 - 91.4 %   Platelets 375.0 150.0 - 400.0 K/uL   Neutrophils Relative % 63.9 43.0 - 77.0 %   Lymphocytes Relative 16.6 12.0 - 46.0 %   Monocytes Relative 9.2 3.0 - 12.0 %   Eosinophils Relative 9.7 (H) 0.0 - 5.0 %   Basophils Relative 0.6 0.0 - 3.0 %   Neutro Abs 7.3 1.4 - 7.7 K/uL   Lymphs Abs 1.9 0.7 - 4.0 K/uL   Monocytes Absolute 1.0 0.1 - 1.0 K/uL   Eosinophils  Absolute 1.1 (H) 0.0 - 0.7 K/uL   Basophils Absolute 0.1 0.0 - 0.1 K/uL  Comprehensive metabolic panel  Result Value Ref Range   Sodium 138 135 - 145 mEq/L   Potassium 3.8 3.5 - 5.1 mEq/L   Chloride 99 96 - 112 mEq/L   CO2 30 19 - 32 mEq/L   Glucose, Bld 109 (H) 70 - 99 mg/dL   BUN 11 6 - 23 mg/dL   Creatinine, Ser 8.110.86 0.40 - 1.50 mg/dL   Total Bilirubin 0.9 0.2 - 1.2 mg/dL   Alkaline Phosphatase 70 39 - 117 U/L   AST 35 0 - 37 U/L   ALT 52 0 - 53 U/L   Total Protein 7.7 6.0 - 8.3 g/dL   Albumin 4.0 3.5 - 5.2 g/dL   Calcium 9.5 8.4 - 91.410.5 mg/dL   GFR 78.2994.73 >56.21>60.00 mL/min  Lipid panel  Result Value Ref Range   Cholesterol 180 0 - 200 mg/dL     Triglycerides 308.6120.0 0.0 - 149.0 mg/dL   HDL 57.8427.50 (L) >69.62>39.00 mg/dL   VLDL 95.224.0 0.0 - 84.140.0 mg/dL   LDL Cholesterol 324129 (H) 0 - 99 mg/dL   Total CHOL/HDL Ratio 7    NonHDL 152.67   TSH  Result Value Ref Range   TSH 1.62 0.35 - 4.50 uIU/mL  Hemoglobin A1c  Result Value Ref Range   Hgb A1c MFr Bld 6.2 4.6 - 6.5 %  PSA  Result Value Ref Range   PSA 0.19 0.10 - 4.00 ng/mL   Assessment and Plan:   1. Vertigo - meclizine (ANTIVERT) 25 MG tablet; Take 1 tablet (25 mg total) by mouth 3 (three) times daily as needed for dizziness.  Dispense: 90 tablet; Refill: 0  . Reviewed expectations re: course of current medical issues. . Discussed self-management of symptoms. . Outlined signs and symptoms indicating need for more acute intervention. . Patient verbalized understanding and all questions were answered. Marland Kitchen. Health Maintenance issues including appropriate healthy diet, exercise, and smoking avoidance were discussed with patient. . See orders for this visit as documented in the electronic medical record. . Patient received an After Visit Summary.  CMA served as Neurosurgeonscribe during this visit. History, Physical, and Plan performed by medical provider. The above documentation has been reviewed and is accurate and complete. Helane RimaErica Kathrynn Backstrom, D.O.  Helane RimaErica Embry Huss, DO [place] 03/14/2018

## 2018-04-11 ENCOUNTER — Ambulatory Visit (INDEPENDENT_AMBULATORY_CARE_PROVIDER_SITE_OTHER): Payer: Medicare Other

## 2018-04-11 ENCOUNTER — Encounter: Payer: Self-pay | Admitting: Family Medicine

## 2018-04-11 ENCOUNTER — Ambulatory Visit: Payer: Medicare Other | Admitting: Family Medicine

## 2018-04-11 VITALS — BP 130/74 | HR 82 | Temp 97.7°F | Ht 72.0 in | Wt 246.0 lb

## 2018-04-11 DIAGNOSIS — R059 Cough, unspecified: Secondary | ICD-10-CM

## 2018-04-11 DIAGNOSIS — R05 Cough: Secondary | ICD-10-CM

## 2018-04-11 MED ORDER — BUDESONIDE-FORMOTEROL FUMARATE 160-4.5 MCG/ACT IN AERO: 2.0000 | INHALATION_SPRAY | Freq: Two times a day (BID) | RESPIRATORY_TRACT | 0 refills | Status: DC

## 2018-04-11 MED ORDER — AZITHROMYCIN 250 MG PO TABS: ORAL_TABLET | ORAL | 0 refills | Status: DC

## 2018-04-11 NOTE — Progress Notes (Signed)
Roy Avila is a 67 y.o. male here for an acute visit.  History of Present Illness:   Roy Avila, CMA acting as scribe for Dr. Helane Rima.   Cough  This is a recurrent problem. The current episode started 1 to 4 weeks ago. The problem has been gradually worsening. The cough is productive of sputum. Pertinent negatives include no chest pain, chills, ear congestion, ear pain, fever, headaches, nasal congestion or shortness of breath. He has tried OTC cough suppressant for the symptoms. The treatment provided mild relief. There is no history of asthma, COPD or emphysema.   PMHx, SurgHx, SocialHx, Medications, and Allergies were reviewed in the Visit Navigator and updated as appropriate.  Current Medications:   Current Outpatient Medications:  .  ALPRAZolam (XANAX) 0.5 MG tablet, Take 1 tablet (0.5 mg total) 2 (two) times daily as needed by mouth for anxiety., Disp: 20 tablet, Rfl: 0 .  amLODipine (NORVASC) 5 MG tablet, TAKE 1 TABLET DAILY (NEED FOLLOW-UP FOR FURTHER REFILLS), Disp: 90 tablet, Rfl: 1 .  esomeprazole (NEXIUM) 40 MG capsule, TAKE 1 CAPSULE DAILY (NEED FOLLOW-UP FOR FURTHER REFILLS), Disp: 90 capsule, Rfl: 1 .  olmesartan-hydrochlorothiazide (BENICAR HCT) 40-25 MG tablet, Take 1 tablet by mouth daily., Disp: 90 tablet, Rfl: 2   Allergies  Allergen Reactions  . Clarithromycin Rash   Review of Systems:   Pertinent items are noted in the HPI. Otherwise, ROS is negative.  Vitals:   Vitals:   04/11/18 1016  BP: 130/74  Pulse: 82  Temp: 97.7 F (36.5 C)  TempSrc: Oral  SpO2: 96%  Weight: 246 lb (111.6 kg)  Height: 6' (1.829 m)     Body mass index is 33.36 kg/m.  Physical Exam:   Physical Exam  Constitutional: He is oriented to person, place, and time. He appears well-developed and well-nourished. No distress.  HENT:  Head: Normocephalic and atraumatic.  Right Ear: External ear normal.  Left Ear: External ear normal.  Nose: Nose normal.    Mouth/Throat: Oropharynx is clear and moist.  Eyes: Pupils are equal, round, and reactive to light. Conjunctivae and EOM are normal.  Neck: Normal range of motion. Neck supple.  Cardiovascular: Normal rate, regular rhythm, normal heart sounds and intact distal pulses.  Pulmonary/Chest: Effort normal.  Central congestion.  Abdominal: Soft. Bowel sounds are normal.  Musculoskeletal: Normal range of motion.  Neurological: He is alert and oriented to person, place, and time.  Skin: Skin is warm and dry.  Psychiatric: He has a normal mood and affect. His behavior is normal. Judgment and thought content normal.  Nursing note and vitals reviewed.   Results for orders placed or performed in visit on 10/05/17  CBC with Differential/Platelet  Result Value Ref Range   WBC 11.4 (H) 4.0 - 10.5 K/uL   RBC 4.59 4.22 - 5.81 Mil/uL   Hemoglobin 14.6 13.0 - 17.0 g/dL   HCT 16.1 09.6 - 04.5 %   MCV 95.6 78.0 - 100.0 fl   MCHC 33.1 30.0 - 36.0 g/dL   RDW 40.9 81.1 - 91.4 %   Platelets 375.0 150.0 - 400.0 K/uL   Neutrophils Relative % 63.9 43.0 - 77.0 %   Lymphocytes Relative 16.6 12.0 - 46.0 %   Monocytes Relative 9.2 3.0 - 12.0 %   Eosinophils Relative 9.7 (H) 0.0 - 5.0 %   Basophils Relative 0.6 0.0 - 3.0 %   Neutro Abs 7.3 1.4 - 7.7 K/uL   Lymphs Abs 1.9 0.7 - 4.0 K/uL  Monocytes Absolute 1.0 0.1 - 1.0 K/uL   Eosinophils Absolute 1.1 (H) 0.0 - 0.7 K/uL   Basophils Absolute 0.1 0.0 - 0.1 K/uL  Comprehensive metabolic panel  Result Value Ref Range   Sodium 138 135 - 145 mEq/L   Potassium 3.8 3.5 - 5.1 mEq/L   Chloride 99 96 - 112 mEq/L   CO2 30 19 - 32 mEq/L   Glucose, Bld 109 (H) 70 - 99 mg/dL   BUN 11 6 - 23 mg/dL   Creatinine, Ser 7.84 0.40 - 1.50 mg/dL   Total Bilirubin 0.9 0.2 - 1.2 mg/dL   Alkaline Phosphatase 70 39 - 117 U/L   AST 35 0 - 37 U/L   ALT 52 0 - 53 U/L   Total Protein 7.7 6.0 - 8.3 g/dL   Albumin 4.0 3.5 - 5.2 g/dL   Calcium 9.5 8.4 - 69.6 mg/dL   GFR 29.52  >84.13 mL/min  Lipid panel  Result Value Ref Range   Cholesterol 180 0 - 200 mg/dL   Triglycerides 244.0 0.0 - 149.0 mg/dL   HDL 10.27 (L) >25.36 mg/dL   VLDL 64.4 0.0 - 03.4 mg/dL   LDL Cholesterol 742 (H) 0 - 99 mg/dL   Total CHOL/HDL Ratio 7    NonHDL 152.67   TSH  Result Value Ref Range   TSH 1.62 0.35 - 4.50 uIU/mL  Hemoglobin A1c  Result Value Ref Range   Hgb A1c MFr Bld 6.2 4.6 - 6.5 %  PSA  Result Value Ref Range   PSA 0.19 0.10 - 4.00 ng/mL   Assessment and Plan:   Roy Avila was seen today for cough.  Diagnoses and all orders for this visit:  Cough -     DG Chest 2 View; Future -     azithromycin (ZITHROMAX) 250 MG tablet; 2 po on day one, then one po daily. -     budesonide-formoterol (SYMBICORT) 160-4.5 MCG/ACT inhaler; Inhale 2 puffs into the lungs 2 (two) times daily.   . Reviewed expectations re: course of current medical issues. . Discussed self-management of symptoms. . Outlined signs and symptoms indicating need for more acute intervention. . Patient verbalized understanding and all questions were answered. Marland Kitchen Health Maintenance issues including appropriate healthy diet, exercise, and smoking avoidance were discussed with patient. . See orders for this visit as documented in the electronic medical record. . Patient received an After Visit Summary.  CMA served as Neurosurgeon during this visit. History, Physical, and Plan performed by medical provider. The above documentation has been reviewed and is accurate and complete. Helane Rima, D.O.  Helane Rima, DO [place], [facility] 04/12/2018

## 2018-05-23 ENCOUNTER — Ambulatory Visit: Payer: Medicare Other | Admitting: Family Medicine

## 2018-05-25 ENCOUNTER — Other Ambulatory Visit: Payer: Self-pay

## 2018-05-25 MED ORDER — OLMESARTAN MEDOXOMIL-HCTZ 40-25 MG PO TABS: 1.0000 | ORAL_TABLET | Freq: Every day | ORAL | 2 refills | Status: DC

## 2018-07-13 DIAGNOSIS — Z9622 Myringotomy tube(s) status: Secondary | ICD-10-CM

## 2018-07-13 DIAGNOSIS — H6691 Otitis media, unspecified, right ear: Secondary | ICD-10-CM

## 2018-07-13 DIAGNOSIS — H6981 Other specified disorders of Eustachian tube, right ear: Secondary | ICD-10-CM | POA: Insufficient documentation

## 2018-07-13 DIAGNOSIS — H6991 Unspecified Eustachian tube disorder, right ear: Secondary | ICD-10-CM | POA: Insufficient documentation

## 2018-07-13 HISTORY — DX: Otitis media, unspecified, right ear: H66.91

## 2018-07-13 HISTORY — DX: Otitis media, unspecified, right ear: Z96.22

## 2018-08-04 IMAGING — DX DG CHEST 2V
2 series · 2 of 2 positions shown · non-contrast
Comparison: PA and lateral chest x-ray June 27, 2017

CLINICAL DATA: Two weeks of productive cough.

EXAM:
CHEST - 2 VIEW

[chest pa]
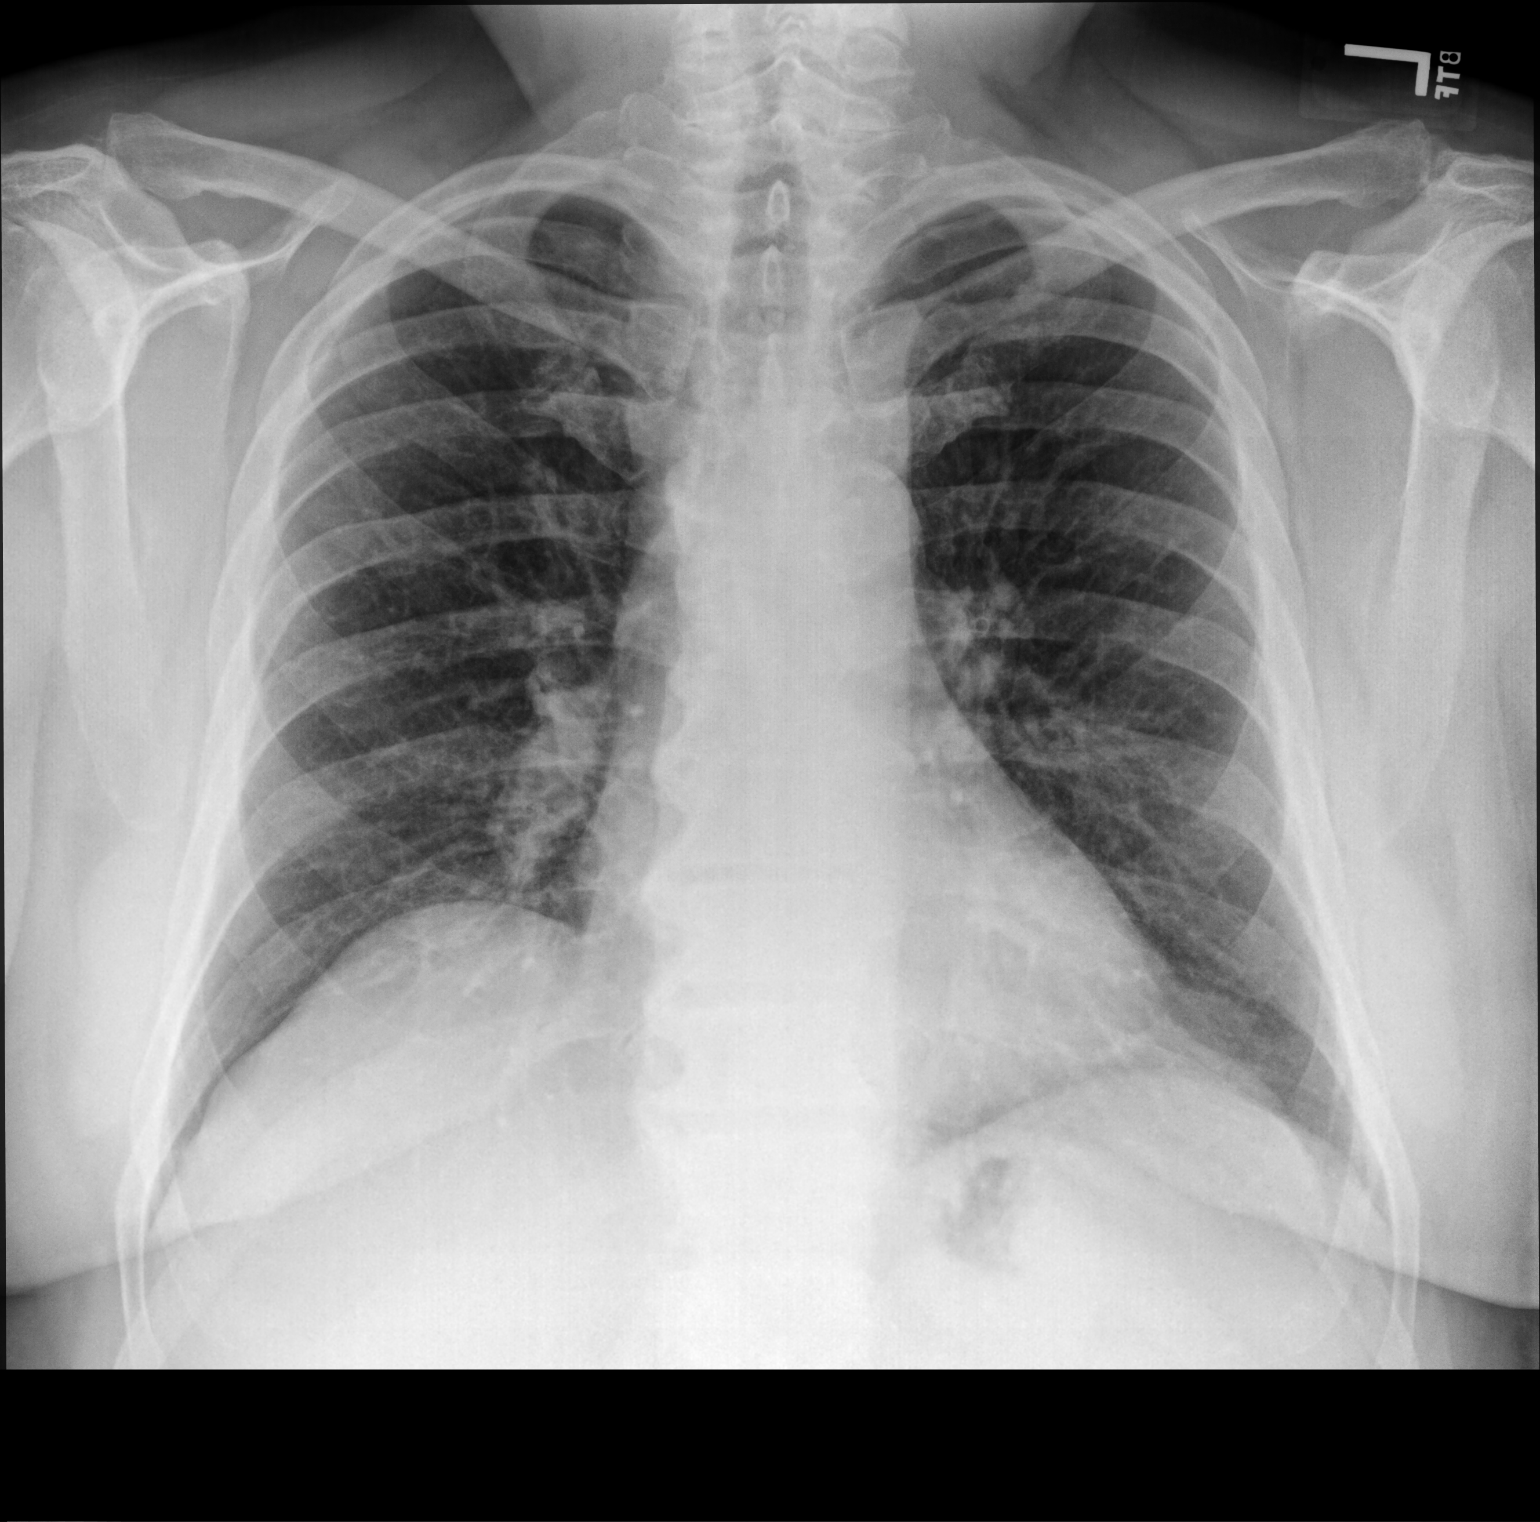

[chest lat]
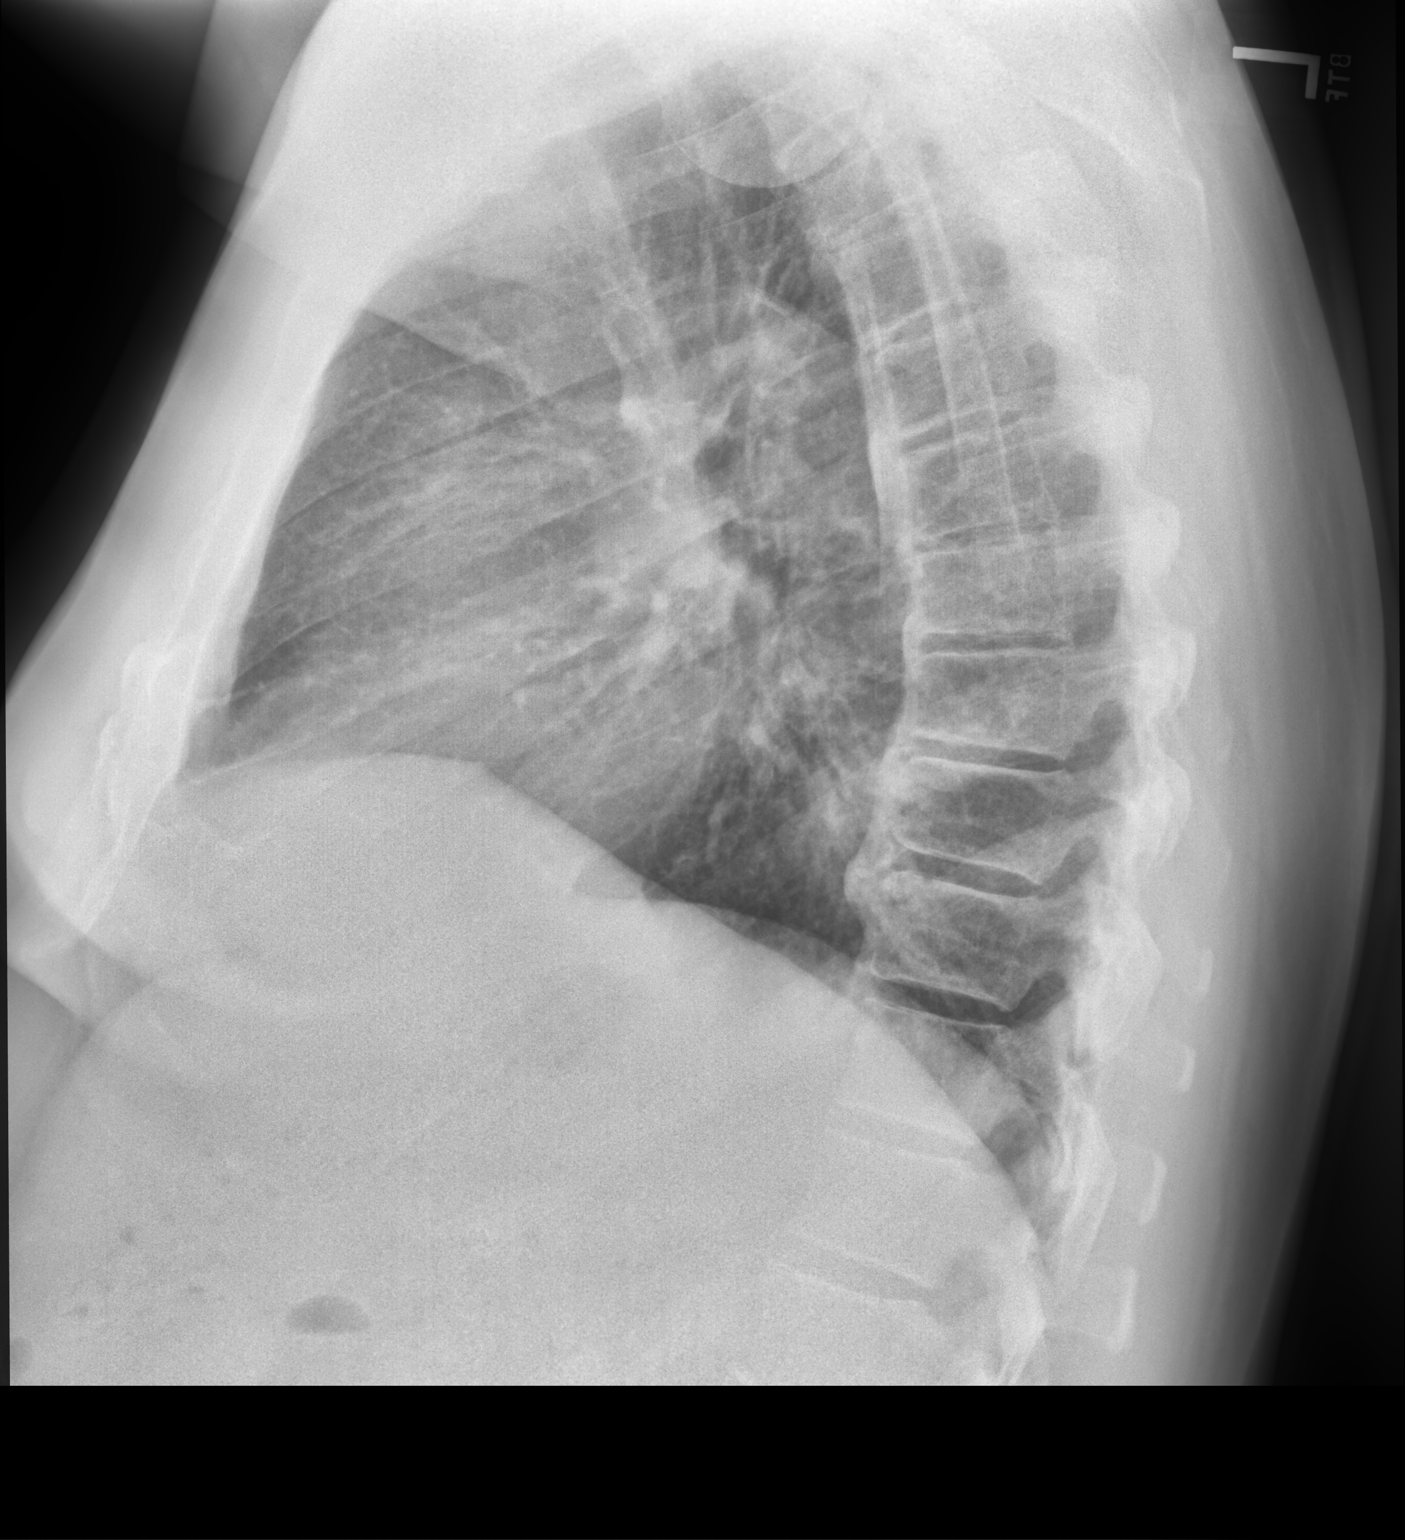

[2 of 2 positions shown; findings below may reference images not displayed]

FINDINGS: The lungs are adequately inflated. There is no focal infiltrate. The
interstitial markings are coarse. There is no pleural effusion. The
heart and pulmonary vascularity are normal. The mediastinum is
normal in width. The trachea is midline. There is calcification in
the wall of the thoracic aortic arch. There is calcification of
portions of the anterior longitudinal ligament of the thoracic
spine.
IMPRESSION: There is no acute pneumonia. There mild chronic interstitial
prominence consistent with chronic bronchitis-smoking related
changes.

## 2018-09-16 ENCOUNTER — Other Ambulatory Visit: Payer: Self-pay | Admitting: Family Medicine

## 2018-09-18 NOTE — Telephone Encounter (Signed)
Pt calling back stating that he is going out of town now and that he want be back until the week after Halloween pt has an appt on 10-02-18 with Earlene Plater pt need Rx until he come in to be seen please call pt with any questions [phone] please send to [id number] - [place], [place]

## 2018-09-18 NOTE — Telephone Encounter (Signed)
See note

## 2018-09-18 NOTE — Telephone Encounter (Signed)
Left message for patient to call office to schedule appointment.

## 2018-10-01 NOTE — Progress Notes (Deleted)
Roy Avila is a 67 y.o. male is here for follow up.  History of Present Illness:   HPI: See Assessment and Plan section for Problem Based Charting of issues discussed today.   Health Maintenance Due  Topic Date Due  . PNA vac Low Risk Adult (1 of 2 - PCV13) 04/30/2017  . INFLUENZA VACCINE  06/29/2018   Depression screen [facility] 2/9 03/14/2018 01/20/2017  Decreased Interest 0 0  Down, Depressed, Hopeless 0 0  PHQ - 2 Score 0 0   PMHx, SurgHx, SocialHx, FamHx, Medications, and Allergies were reviewed in the Visit Navigator and updated as appropriate.   Patient Active Problem List   Diagnosis Date Noted  . Dyslipidemia (high LDL; low HDL) 10/07/2017  . Nocturia 01/27/2017  . Impaired fasting glucose 01/27/2017  . HLD (hyperlipidemia) 01/27/2017  . Elevated LFTs 01/27/2017  . Obesity (BMI 30.0-34.9) 01/22/2017  . OSA on CPAP 01/22/2017  . HTN, goal below 140/80 01/20/2017  . Anxiety 01/20/2017  . Gastroesophageal reflux disease without esophagitis 01/20/2017  . Smoker 01/20/2017  . Mixed conductive and sensorineural hearing loss of right ear with restricted hearing of left ear 12/30/2016  . Chronic serous otitis media of right ear 12/30/2016   Social History   Tobacco Use  . Smoking status: Current Some Day Smoker    Years: 30.00    Types: Cigarettes  . Smokeless tobacco: Never Used  . Tobacco comment: Social smoker, 1 pack per week  Substance Use Topics  . Alcohol use: Yes    Comment: socially  . Drug use: No   Current Medications and Allergies:   Current Outpatient Medications:  .  ALPRAZolam (XANAX) 0.5 MG tablet, Take 1 tablet (0.5 mg total) 2 (two) times daily as needed by mouth for anxiety., Disp: 20 tablet, Rfl: 0 .  amLODipine (NORVASC) 5 MG tablet, TAKE 1 TABLET DAILY (NEED FOLLOW-UP FOR FURTHER REFILLS), Disp: 90 tablet, Rfl: 0 .  azithromycin (ZITHROMAX) 250 MG tablet, 2 po on day one, then one po daily., Disp: 6 tablet, Rfl: 0 .  budesonide-formoterol  (SYMBICORT) 160-4.5 MCG/ACT inhaler, Inhale 2 puffs into the lungs 2 (two) times daily., Disp: 1 Inhaler, Rfl: 0 .  esomeprazole (NEXIUM) 40 MG capsule, TAKE 1 CAPSULE DAILY (NEED FOLLOW-UP FOR FURTHER REFILLS), Disp: 90 capsule, Rfl: 0 .  olmesartan-hydrochlorothiazide (BENICAR HCT) 40-25 MG tablet, Take 1 tablet by mouth daily., Disp: 90 tablet, Rfl: 2  Allergies  Allergen Reactions  . Clarithromycin Rash   Review of Systems   Pertinent items are noted in the HPI. Otherwise, ROS is negative.  Vitals:  There were no vitals filed for this visit.   There is no height or weight on file to calculate BMI.  Physical Exam:   Physical Exam  Results for orders placed or performed in visit on 10/05/17  CBC with Differential/Platelet  Result Value Ref Range   WBC 11.4 (H) 4.0 - 10.5 K/uL   RBC 4.59 4.22 - 5.81 Mil/uL   Hemoglobin 14.6 13.0 - 17.0 g/dL   HCT 16.1 09.6 - 04.5 %   MCV 95.6 78.0 - 100.0 fl   MCHC 33.1 30.0 - 36.0 g/dL   RDW 40.9 81.1 - 91.4 %   Platelets 375.0 150.0 - 400.0 K/uL   Neutrophils Relative % 63.9 43.0 - 77.0 %   Lymphocytes Relative 16.6 12.0 - 46.0 %   Monocytes Relative 9.2 3.0 - 12.0 %   Eosinophils Relative 9.7 (H) 0.0 - 5.0 %   Basophils Relative  0.6 0.0 - 3.0 %   Neutro Abs 7.3 1.4 - 7.7 K/uL   Lymphs Abs 1.9 0.7 - 4.0 K/uL   Monocytes Absolute 1.0 0.1 - 1.0 K/uL   Eosinophils Absolute 1.1 (H) 0.0 - 0.7 K/uL   Basophils Absolute 0.1 0.0 - 0.1 K/uL  Comprehensive metabolic panel  Result Value Ref Range   Sodium 138 135 - 145 mEq/L   Potassium 3.8 3.5 - 5.1 mEq/L   Chloride 99 96 - 112 mEq/L   CO2 30 19 - 32 mEq/L   Glucose, Bld 109 (H) 70 - 99 mg/dL   BUN 11 6 - 23 mg/dL   Creatinine, Ser 5.78 0.40 - 1.50 mg/dL   Total Bilirubin 0.9 0.2 - 1.2 mg/dL   Alkaline Phosphatase 70 39 - 117 U/L   AST 35 0 - 37 U/L   ALT 52 0 - 53 U/L   Total Protein 7.7 6.0 - 8.3 g/dL   Albumin 4.0 3.5 - 5.2 g/dL   Calcium 9.5 8.4 - 46.9 mg/dL   GFR 62.95 >28.41  mL/min  Lipid panel  Result Value Ref Range   Cholesterol 180 0 - 200 mg/dL   Triglycerides 324.4 0.0 - 149.0 mg/dL   HDL 01.02 (L) >72.53 mg/dL   VLDL 66.4 0.0 - 40.3 mg/dL   LDL Cholesterol 474 (H) 0 - 99 mg/dL   Total CHOL/HDL Ratio 7    NonHDL 152.67   TSH  Result Value Ref Range   TSH 1.62 0.35 - 4.50 uIU/mL  Hemoglobin A1c  Result Value Ref Range   Hgb A1c MFr Bld 6.2 4.6 - 6.5 %  PSA  Result Value Ref Range   PSA 0.19 0.10 - 4.00 ng/mL    Assessment and Plan:   No problem-specific Assessment & Plan notes found for this encounter.   No orders of the defined types were placed in this encounter.   No orders of the defined types were placed in this encounter.   . Reviewed expectations re: course of current medical issues. . Discussed self-management of symptoms. . Outlined signs and symptoms indicating need for more acute intervention. . Patient verbalized understanding and all questions were answered. Marland Kitchen Health Maintenance issues including appropriate healthy diet, exercise, and smoking avoidance were discussed with patient. . See orders for this visit as documented in the electronic medical record. . Patient received an After Visit Summary.  Helane Rima, DO [place], [facility] 10/01/2018

## 2018-10-02 ENCOUNTER — Ambulatory Visit: Payer: Medicare Other | Admitting: Family Medicine

## 2018-10-06 NOTE — Progress Notes (Signed)
Roy Avila is a 67 y.o. male is here for follow up.  History of Present Illness:   Roy Avila, CMA acting as scribe for Roy Avila.   HPI: Patient in office for follow up on medications. Patient is not having any problems with medications and taking as prescribed. He does need refills that will be sent in today.   There are no preventive care reminders to display for this patient.   Depression screen [facility] 2/9 03/14/2018 01/20/2017  Decreased Interest 0 0  Down, Depressed, Hopeless 0 0  PHQ - 2 Score 0 0   PMHx, SurgHx, SocialHx, FamHx, Medications, and Allergies were reviewed in the Visit Navigator and updated as appropriate.   Patient Active Problem List   Diagnosis Date Noted  . Rosacea 10/10/2018  . Dyslipidemia (high LDL; low HDL) 10/07/2017  . Nocturia 01/27/2017  . Impaired fasting glucose 01/27/2017  . HLD (hyperlipidemia) 01/27/2017  . Elevated LFTs 01/27/2017  . Obesity (BMI 30.0-34.9) 01/22/2017  . OSA on CPAP 01/22/2017  . HTN, goal below 140/80 01/20/2017  . Anxiety 01/20/2017  . Gastroesophageal reflux disease without esophagitis 01/20/2017  . Smoker 01/20/2017  . Mixed conductive and sensorineural hearing loss of right ear with restricted hearing of left ear 12/30/2016  . Chronic serous otitis media of right ear 12/30/2016   Social History   Tobacco Use  . Smoking status: Current Some Day Smoker    Years: 30.00    Types: Cigarettes  . Smokeless tobacco: Never Used  . Tobacco comment: Social smoker, 1 pack per week  Substance Use Topics  . Alcohol use: Yes    Comment: socially  . Drug use: No   Current Medications and Allergies:   Current Outpatient Medications:  .  doxycycline (VIBRAMYCIN) 100 MG capsule, Take 100 mg by mouth 2 (two) times daily., Disp: , Rfl: 5 .  esomeprazole (NEXIUM) 40 MG capsule, TAKE 1 CAPSULE DAILY (NEED FOLLOW-UP FOR FURTHER REFILLS), Disp: 90 capsule, Rfl: 0 .  olmesartan-hydrochlorothiazide (BENICAR HCT)  40-25 MG tablet, Take 1 tablet by mouth daily., Disp: 90 tablet, Rfl: 2 .  ALPRAZolam (XANAX) 0.5 MG tablet, Take 1 tablet (0.5 mg total) by mouth 2 (two) times daily as needed for anxiety., Disp: 20 tablet, Rfl: 0 .  amLODipine (NORVASC) 5 MG tablet, TAKE 1 TABLET DAILY (NEED FOLLOW-UP FOR FURTHER REFILLS), Disp: 90 tablet, Rfl: 2   Allergies  Allergen Reactions  . Clarithromycin Rash   Review of Systems   Pertinent items are noted in the HPI. Otherwise, ROS is negative.  Vitals:   Vitals:   10/09/18 1417  BP: 124/62  Pulse: 93  Temp: 97.9 F (36.6 C)  TempSrc: Oral  SpO2: 96%  Weight: 258 lb 6.4 oz (117.2 kg)  Height: 6' (1.829 m)     Body mass index is 35.05 kg/m.  Physical Exam:   Physical Exam  Constitutional: He is oriented to person, place, and time. He appears well-developed and well-nourished. No distress.  HENT:  Head: Normocephalic and atraumatic.  Right Ear: External ear normal.  Left Ear: External ear normal.  Nose: Nose normal.  Mouth/Throat: Oropharynx is clear and moist.  Blue TM tube.  Eyes: Pupils are equal, round, and reactive to light. Conjunctivae and EOM are normal.  Neck: Normal range of motion. Neck supple.  Cardiovascular: Normal rate, regular rhythm, normal heart sounds and intact distal pulses.  Pulmonary/Chest: Effort normal and breath sounds normal.  Abdominal: Soft. Bowel sounds are normal.  Musculoskeletal: Normal  range of motion.  Neurological: He is alert and oriented to person, place, and time.  Skin: Skin is warm and dry.  Psychiatric: He has a normal mood and affect. His behavior is normal. Judgment and thought content normal.  Nursing note and vitals reviewed.  Assessment and Plan:   Roy Avila was seen today for follow-up.  Diagnoses and all orders for this visit:  Routine physical examination  Anxiety -     ALPRAZolam (XANAX) 0.5 MG tablet; Take 1 tablet (0.5 mg total) by mouth 2 (two) times daily as needed for  anxiety.  HTN, goal below 140/80 -     amLODipine (NORVASC) 5 MG tablet; TAKE 1 TABLET DAILY (NEED FOLLOW-UP FOR FURTHER REFILLS) -     olmesartan-hydrochlorothiazide (BENICAR HCT) 40-25 MG tablet; Take 1 tablet by mouth daily. -     Comprehensive metabolic panel; Future  Need for pneumococcal vaccination -     Pneumococcal conjugate vaccine 13-valent  Rosacea  Obesity (BMI 30.0-34.9)  Pure hypercholesterolemia -     CBC with Differential/Platelet; Future -     Lipid panel; Future  OSA on CPAP  Encounter for immunization -     Flu vaccine HIGH DOSE PF  Needs flu shot -     Flu vaccine HIGH DOSE PF  Elevated LFTs -     CBC with Differential/Platelet; Future  Nocturia -     PSA; Future  Patient Counseling: [x]   Nutrition: Stressed importance of moderation in sodium/caffeine intake, saturated fat and cholesterol, caloric balance, sufficient intake of fresh fruits, vegetables, and fiber  [x]   Stressed the importance of regular exercise.   []   Substance Abuse: Discussed cessation/primary prevention of tobacco, alcohol, or other drug use; driving or other dangerous activities under the influence; availability of treatment for abuse.   [x]   Injury prevention: Discussed safety belts, safety helmets, smoke detector, smoking near bedding or upholstery.   []   Sexuality: Discussed sexually transmitted diseases, partner selection, use of condoms, avoidance of unintended pregnancy  and contraceptive alternatives.   [x]   Dental health: Discussed importance of regular tooth brushing, flossing, and dental visits.  [x]   Health maintenance and immunizations reviewed. Please refer to Health maintenance section.   Roy Rima, DO [place], [facility] 10/10/2018

## 2018-10-09 ENCOUNTER — Encounter: Payer: Self-pay | Admitting: Family Medicine

## 2018-10-09 ENCOUNTER — Ambulatory Visit: Payer: Medicare Other | Admitting: Family Medicine

## 2018-10-09 VITALS — BP 124/62 | HR 93 | Temp 97.9°F | Ht 72.0 in | Wt 258.4 lb

## 2018-10-09 DIAGNOSIS — I10 Essential (primary) hypertension: Secondary | ICD-10-CM | POA: Diagnosis not present

## 2018-10-09 DIAGNOSIS — Z0000 Encounter for general adult medical examination without abnormal findings: Secondary | ICD-10-CM

## 2018-10-09 DIAGNOSIS — Z23 Encounter for immunization: Secondary | ICD-10-CM

## 2018-10-09 DIAGNOSIS — F419 Anxiety disorder, unspecified: Secondary | ICD-10-CM

## 2018-10-09 DIAGNOSIS — Z9989 Dependence on other enabling machines and devices: Secondary | ICD-10-CM

## 2018-10-09 DIAGNOSIS — R351 Nocturia: Secondary | ICD-10-CM

## 2018-10-09 DIAGNOSIS — L719 Rosacea, unspecified: Secondary | ICD-10-CM

## 2018-10-09 DIAGNOSIS — E7800 Pure hypercholesterolemia, unspecified: Secondary | ICD-10-CM

## 2018-10-09 DIAGNOSIS — G4733 Obstructive sleep apnea (adult) (pediatric): Secondary | ICD-10-CM

## 2018-10-09 DIAGNOSIS — R945 Abnormal results of liver function studies: Secondary | ICD-10-CM

## 2018-10-09 DIAGNOSIS — R7989 Other specified abnormal findings of blood chemistry: Secondary | ICD-10-CM

## 2018-10-09 DIAGNOSIS — E669 Obesity, unspecified: Secondary | ICD-10-CM

## 2018-10-09 MED ORDER — AMLODIPINE BESYLATE 5 MG PO TABS: ORAL_TABLET | ORAL | 2 refills | Status: DC

## 2018-10-09 MED ORDER — ALPRAZOLAM 0.5 MG PO TABS: 0.5000 mg | ORAL_TABLET | Freq: Two times a day (BID) | ORAL | 0 refills | Status: DC | PRN

## 2018-10-09 MED ORDER — OLMESARTAN MEDOXOMIL-HCTZ 40-25 MG PO TABS: 1.0000 | ORAL_TABLET | Freq: Every day | ORAL | 2 refills | Status: DC

## 2018-10-10 DIAGNOSIS — L719 Rosacea, unspecified: Secondary | ICD-10-CM | POA: Insufficient documentation

## 2018-11-02 ENCOUNTER — Other Ambulatory Visit: Payer: Medicare Other

## 2018-11-03 ENCOUNTER — Other Ambulatory Visit (INDEPENDENT_AMBULATORY_CARE_PROVIDER_SITE_OTHER): Payer: Medicare Other

## 2018-11-03 DIAGNOSIS — I10 Essential (primary) hypertension: Secondary | ICD-10-CM | POA: Diagnosis not present

## 2018-11-03 DIAGNOSIS — R945 Abnormal results of liver function studies: Secondary | ICD-10-CM

## 2018-11-03 DIAGNOSIS — R7989 Other specified abnormal findings of blood chemistry: Secondary | ICD-10-CM

## 2018-11-03 DIAGNOSIS — R351 Nocturia: Secondary | ICD-10-CM

## 2018-11-03 DIAGNOSIS — E7800 Pure hypercholesterolemia, unspecified: Secondary | ICD-10-CM | POA: Diagnosis not present

## 2018-11-03 LAB — CBC WITH DIFFERENTIAL/PLATELET
Basophils Absolute: 0.1 10*3/uL (ref 0.0–0.1)
Basophils Relative: 0.9 % (ref 0.0–3.0)
Eosinophils Absolute: 1.3 10*3/uL — ABNORMAL HIGH (ref 0.0–0.7)
Eosinophils Relative: 11.1 % — ABNORMAL HIGH (ref 0.0–5.0)
HCT: 42.7 % (ref 39.0–52.0)
Hemoglobin: 14.3 g/dL (ref 13.0–17.0)
Lymphocytes Relative: 22.8 % (ref 12.0–46.0)
Lymphs Abs: 2.6 10*3/uL (ref 0.7–4.0)
MCHC: 33.4 g/dL (ref 30.0–36.0)
MCV: 92.8 fl (ref 78.0–100.0)
Monocytes Absolute: 0.9 10*3/uL (ref 0.1–1.0)
Monocytes Relative: 7.5 % (ref 3.0–12.0)
Neutro Abs: 6.5 10*3/uL (ref 1.4–7.7)
Neutrophils Relative %: 57.7 % (ref 43.0–77.0)
Platelets: 320 10*3/uL (ref 150.0–400.0)
RBC: 4.6 Mil/uL (ref 4.22–5.81)
RDW: 14.4 % (ref 11.5–15.5)
WBC: 11.3 10*3/uL — ABNORMAL HIGH (ref 4.0–10.5)

## 2018-11-03 LAB — COMPREHENSIVE METABOLIC PANEL
ALT: 52 U/L (ref 0–53)
AST: 36 U/L (ref 0–37)
Albumin: 4.1 g/dL (ref 3.5–5.2)
Alkaline Phosphatase: 61 U/L (ref 39–117)
BUN: 14 mg/dL (ref 6–23)
CO2: 30 mEq/L (ref 19–32)
Calcium: 9.5 mg/dL (ref 8.4–10.5)
Chloride: 102 mEq/L (ref 96–112)
Creatinine, Ser: 0.86 mg/dL (ref 0.40–1.50)
GFR: 94.42 mL/min (ref >60.00)
Glucose, Bld: 120 mg/dL — ABNORMAL HIGH (ref 70–99)
Potassium: 3.9 mEq/L (ref 3.5–5.1)
Sodium: 141 mEq/L (ref 135–145)
Total Bilirubin: 0.6 mg/dL (ref 0.2–1.2)
Total Protein: 7.5 g/dL (ref 6.0–8.3)

## 2018-11-03 LAB — LIPID PANEL
Cholesterol: 167 mg/dL (ref 0–200)
HDL: 28.3 mg/dL — ABNORMAL LOW (ref >39.00)
LDL Cholesterol: 105 mg/dL — ABNORMAL HIGH (ref 0–99)
NonHDL: 139.09
Total CHOL/HDL Ratio: 6
Triglycerides: 168 mg/dL — ABNORMAL HIGH (ref 0.0–149.0)
VLDL: 33.6 mg/dL (ref 0.0–40.0)

## 2018-11-03 LAB — PSA: PSA: 0.21 ng/mL (ref 0.10–4.00)

## 2018-11-05 ENCOUNTER — Encounter: Payer: Self-pay | Admitting: Family Medicine

## 2018-11-08 ENCOUNTER — Other Ambulatory Visit: Payer: Self-pay

## 2018-11-08 DIAGNOSIS — R739 Hyperglycemia, unspecified: Secondary | ICD-10-CM

## 2018-11-16 ENCOUNTER — Other Ambulatory Visit (INDEPENDENT_AMBULATORY_CARE_PROVIDER_SITE_OTHER): Payer: Medicare Other

## 2018-11-16 DIAGNOSIS — R739 Hyperglycemia, unspecified: Secondary | ICD-10-CM

## 2018-11-16 LAB — HEMOGLOBIN A1C: Hgb A1c MFr Bld: 6.5 % (ref 4.6–6.5)

## 2018-11-17 ENCOUNTER — Encounter: Payer: Self-pay | Admitting: Family Medicine

## 2018-11-17 ENCOUNTER — Telehealth: Payer: Self-pay | Admitting: Family Medicine

## 2018-11-17 ENCOUNTER — Other Ambulatory Visit: Payer: Medicare Other

## 2018-11-17 NOTE — Telephone Encounter (Signed)
Copied from CRM [id number]. Topic: Quick Communication - See Telephone Encounter >> Nov 17, 2018  2:23 PM Terisa Starraylor, Brittany L wrote: CRM for notification. See Telephone encounter for: 11/17/18.  Pt said he missed a call from the office regarding his labs. Please contact patient @ [phone]

## 2018-11-17 NOTE — Telephone Encounter (Signed)
See note

## 2018-11-20 NOTE — Telephone Encounter (Signed)
Patient would like a call first thing in the morning with lab results

## 2018-11-21 NOTE — Telephone Encounter (Signed)
See note

## 2018-11-21 NOTE — Telephone Encounter (Signed)
Patient notified of appt date and verbalized understanding.

## 2018-12-05 ENCOUNTER — Ambulatory Visit: Payer: Medicare Other | Admitting: Family Medicine

## 2018-12-05 ENCOUNTER — Encounter: Payer: Self-pay | Admitting: Family Medicine

## 2018-12-05 VITALS — BP 138/74 | HR 75 | Temp 97.9°F | Ht 72.0 in | Wt 265.8 lb

## 2018-12-05 DIAGNOSIS — R7989 Other specified abnormal findings of blood chemistry: Secondary | ICD-10-CM

## 2018-12-05 DIAGNOSIS — E8881 Metabolic syndrome: Secondary | ICD-10-CM | POA: Diagnosis not present

## 2018-12-05 DIAGNOSIS — J208 Acute bronchitis due to other specified organisms: Secondary | ICD-10-CM | POA: Diagnosis not present

## 2018-12-05 DIAGNOSIS — E785 Hyperlipidemia, unspecified: Secondary | ICD-10-CM | POA: Diagnosis not present

## 2018-12-05 DIAGNOSIS — R945 Abnormal results of liver function studies: Secondary | ICD-10-CM

## 2018-12-05 DIAGNOSIS — F17210 Nicotine dependence, cigarettes, uncomplicated: Secondary | ICD-10-CM

## 2018-12-05 DIAGNOSIS — E88819 Insulin resistance, unspecified: Secondary | ICD-10-CM

## 2018-12-05 DIAGNOSIS — B9689 Other specified bacterial agents as the cause of diseases classified elsewhere: Secondary | ICD-10-CM

## 2018-12-05 MED ORDER — METFORMIN HCL ER 750 MG PO TB24: 750.0000 mg | ORAL_TABLET | Freq: Every day | ORAL | 3 refills | Status: DC

## 2018-12-05 MED ORDER — AZITHROMYCIN 250 MG PO TABS: ORAL_TABLET | ORAL | 0 refills | Status: DC

## 2018-12-05 NOTE — Progress Notes (Signed)
Roy Avila is a 68 y.o. male is here for follow up.  History of Present Illness:   HPI:  Cough, with purulent sputum, with cough worse at night, for over 9 days. No CP, SOB, wheeze. Intermittent smoker.   Review: taking medications as instructed, no medication side effects noted, no TIAs, no chest pain on exertion, no dyspnea on exertion, no swelling of ankles. Smoker: Yes.   BP Readings from Last 3 Encounters:  12/05/18 138/74  10/09/18 124/62  04/11/18 130/74   Lab Results  Component Value Date   CREATININE 0.86 11/03/2018   CREATININE 0.86 10/05/2017   CREATININE 0.8 08/12/2016     There are no preventive care reminders to display for this patient.   Depression screen [facility] 2/9 12/05/2018 03/14/2018 01/20/2017  Decreased Interest 0 0 0  Down, Depressed, Hopeless 0 0 0  PHQ - 2 Score 0 0 0  Altered sleeping 0 - -  Tired, decreased energy 2 - -  Change in appetite 2 - -  Feeling bad or failure about yourself  0 - -  Trouble concentrating 0 - -  Moving slowly or fidgety/restless 0 - -  Suicidal thoughts 0 - -  PHQ-9 Score 4 - -  Difficult doing work/chores Not difficult at all - -   PMHx, SurgHx, SocialHx, FamHx, Medications, and Allergies were reviewed in the Visit Navigator and updated as appropriate.   Patient Active Problem List   Diagnosis Date Noted  . Insulin resistance 12/10/2018  . Morbid obesity (HCC) 12/10/2018  . Rosacea 10/10/2018  . Chronic otitis media after insertion of tympanic ventilation tube, right 07/13/2018  . Dyslipidemia (high LDL; low HDL) 10/07/2017  . Nocturia 01/27/2017  . Abnormal LFTs (liver function tests), monitoring, likely due to fatty liver, may need [name] 01/27/2017  . OSA on CPAP 01/22/2017  . HTN, goal below 130/80 01/20/2017  . Episode of anxiety, prn Xanax, uses sparingly 01/20/2017  . Gastroesophageal reflux disease without esophagitis, on Nexium 01/20/2017  . Nicotine dependence 01/20/2017  . Mixed conductive and sensorineural  hearing loss of right ear with restricted hearing of left ear 12/30/2016   Social History   Tobacco Use  . Smoking status: Current Some Day Smoker    Years: 30.00    Types: Cigarettes  . Smokeless tobacco: Never Used  . Tobacco comment: Social smoker, 1 pack per week  Substance Use Topics  . Alcohol use: Yes    Comment: socially  . Drug use: No   Current Medications and Allergies:   .  ALPRAZolam (XANAX) 0.5 MG tablet, Take 1 tablet (0.5 mg total) by mouth 2 (two) times daily as needed for anxiety., Disp: 20 tablet, Rfl: 0 .  amLODipine (NORVASC) 5 MG tablet, TAKE 1 TABLET DAILY (NEED FOLLOW-UP FOR FURTHER REFILLS), Disp: 90 tablet, Rfl: 2 .  esomeprazole (NEXIUM) 40 MG capsule, TAKE 1 CAPSULE DAILY (NEED FOLLOW-UP FOR FURTHER REFILLS), Disp: 90 capsule, Rfl: 0 .  olmesartan-hydrochlorothiazide (BENICAR HCT) 40-25 MG tablet, Take 1 tablet by mouth daily., Disp: 90 tablet, Rfl: 2   Allergies  Allergen Reactions  . Clarithromycin Rash   Review of Systems   Pertinent items are noted in the HPI. Otherwise, a complete ROS is negative.  Vitals:   Vitals:   12/05/18 1543  BP: 138/74  Pulse: 75  Temp: 97.9 F (36.6 C)  TempSrc: Oral  SpO2: 96%  Weight: 265 lb 12.8 oz (120.6 kg)  Height: 6' (1.829 m)     Body mass  index is 36.05 kg/m.  Physical Exam:   Physical Exam Vitals signs and nursing note reviewed.  Constitutional:      General: He is not in acute distress.    Appearance: He is well-developed.  HENT:     Head: Normocephalic and atraumatic.     Right Ear: External ear normal.     Left Ear: External ear normal.     Nose: Mucosal edema and rhinorrhea present.  Eyes:     Conjunctiva/sclera: Conjunctivae normal.     Pupils: Pupils are equal, round, and reactive to light.  Neck:     Musculoskeletal: Neck supple.  Cardiovascular:     Rate and Rhythm: Normal rate and regular rhythm.  Pulmonary:     Effort: Pulmonary effort is normal.     Breath sounds:  Rhonchi present.  Abdominal:     General: Bowel sounds are normal.     Palpations: Abdomen is soft.  Musculoskeletal: Normal range of motion.  Skin:    General: Skin is warm.  Neurological:     Mental Status: He is alert.  Psychiatric:        Behavior: Behavior normal.     Results for orders placed or performed in visit on 11/16/18  Hemoglobin A1c  Result Value Ref Range   Hgb A1c MFr Bld 6.5 4.6 - 6.5 %    Assessment and Plan:   Roy Avila was seen today for consult and cough.  Diagnoses and all orders for this visit:  Insulin resistance Comments: Discussed designating DM or insulin resistance. He wants a chance to see if weight loss will help. Referral to [facility].  Orders: -     metFORMIN (GLUCOPHAGE XR) 750 MG 24 hr tablet; Take 1 tablet (750 mg total) by mouth daily with breakfast.  Acute bacterial bronchitis Comments: In smoker, > 1 week. Okay ABx today.  Orders: -     azithromycin (ZITHROMAX) 250 MG tablet; 2 po on day one, then one po daily until gone  Dyslipidemia (high LDL; low HDL)  Cigarette nicotine dependence without complication Comments: Recommended full cessation.   Morbid obesity (HCC) Comments: BMI 35 + comorbid conditions.   Abnormal LFTs (liver function tests) Comments: In normal range, but still elevated. Considering [name]. Likely fatty liver changes.     . Orders and follow up as documented in EpicCare, reviewed diet, exercise and weight control, cardiovascular risk and specific lipid/LDL goals reviewed, reviewed medications and side effects in detail.  . Reviewed expectations re: course of current medical issues. . Outlined signs and symptoms indicating need for more acute intervention. . Patient verbalized understanding and all questions were answered. . Patient received an After Visit Summary.  Helane Rima, DO [place], [facility] 12/10/2018

## 2018-12-10 ENCOUNTER — Encounter: Payer: Self-pay | Admitting: Family Medicine

## 2018-12-10 DIAGNOSIS — E88819 Insulin resistance, unspecified: Secondary | ICD-10-CM | POA: Insufficient documentation

## 2018-12-10 DIAGNOSIS — E8881 Metabolic syndrome: Secondary | ICD-10-CM | POA: Insufficient documentation

## 2018-12-26 ENCOUNTER — Other Ambulatory Visit: Payer: Self-pay | Admitting: Family Medicine

## 2019-01-11 ENCOUNTER — Encounter (INDEPENDENT_AMBULATORY_CARE_PROVIDER_SITE_OTHER): Payer: BLUE CROSS/BLUE SHIELD

## 2019-01-18 ENCOUNTER — Encounter (INDEPENDENT_AMBULATORY_CARE_PROVIDER_SITE_OTHER): Payer: Self-pay | Admitting: Family Medicine

## 2019-01-18 ENCOUNTER — Ambulatory Visit (INDEPENDENT_AMBULATORY_CARE_PROVIDER_SITE_OTHER): Payer: Medicare Other | Admitting: Family Medicine

## 2019-01-18 VITALS — BP 132/77 | HR 71 | Temp 98.3°F | Ht 71.0 in | Wt 256.0 lb

## 2019-01-18 DIAGNOSIS — E1165 Type 2 diabetes mellitus with hyperglycemia: Secondary | ICD-10-CM | POA: Diagnosis not present

## 2019-01-18 DIAGNOSIS — Z6835 Body mass index (BMI) 35.0-35.9, adult: Secondary | ICD-10-CM

## 2019-01-18 DIAGNOSIS — I10 Essential (primary) hypertension: Secondary | ICD-10-CM

## 2019-01-18 DIAGNOSIS — G4733 Obstructive sleep apnea (adult) (pediatric): Secondary | ICD-10-CM

## 2019-01-18 DIAGNOSIS — R0602 Shortness of breath: Secondary | ICD-10-CM | POA: Diagnosis not present

## 2019-01-18 DIAGNOSIS — Z720 Tobacco use: Secondary | ICD-10-CM

## 2019-01-18 DIAGNOSIS — Z1331 Encounter for screening for depression: Secondary | ICD-10-CM

## 2019-01-18 DIAGNOSIS — Z0289 Encounter for other administrative examinations: Secondary | ICD-10-CM

## 2019-01-18 DIAGNOSIS — R5383 Other fatigue: Secondary | ICD-10-CM

## 2019-01-18 DIAGNOSIS — Z9989 Dependence on other enabling machines and devices: Secondary | ICD-10-CM

## 2019-01-19 LAB — T4, FREE: Free T4: 1.09 ng/dL (ref 0.82–1.77)

## 2019-01-19 LAB — VITAMIN D 25 HYDROXY (VIT D DEFICIENCY, FRACTURES): Vit D, 25-Hydroxy: 21.9 ng/mL — ABNORMAL LOW (ref 30.0–100.0)

## 2019-01-19 LAB — MICROALBUMIN / CREATININE URINE RATIO
Creatinine, Urine: 271.5 mg/dL
Microalb/Creat Ratio: 14 mg/g creat (ref 0–29)
Microalbumin, Urine: 38.1 ug/mL

## 2019-01-19 LAB — INSULIN, RANDOM: INSULIN: 32.2 u[IU]/mL — ABNORMAL HIGH (ref 2.6–24.9)

## 2019-01-19 LAB — T3: T3, Total: 119 ng/dL (ref 71–180)

## 2019-01-19 LAB — VITAMIN B12: Vitamin B-12: 502 pg/mL (ref 232–1245)

## 2019-01-21 NOTE — Progress Notes (Signed)
Office: [phone]  /  Fax: [phone]   Dear Dr. Earlene Plater,   Thank you for referring Roy Avila to our clinic. The following note includes my evaluation and treatment recommendations.  HPI:   Chief Complaint: OBESITY    Roy Avila has been referred by Helane Rima, DO for consultation regarding his obesity and obesity related comorbidities.    Roy Avila (MR# [id number]) is a 68 y.o. male who presents on 01/18/2019 for obesity evaluation and treatment. Current BMI is Body mass index is 35.7 kg/m.Marland Kitchen Mael has been struggling with his weight for many years and has been unsuccessful in either losing weight, maintaining weight loss, or reaching his healthy weight goal.     Roy Avila attended our information session and states he is currently in the action stage of change and ready to dedicate time achieving and maintaining a healthier weight. Roy Avila is interested in becoming our patient and working on intensive lifestyle modifications including (but not limited to) diet, exercise and weight loss.    Roy Avila states his family eats meals together he thinks his family will eat healthier with  him his desired weight loss is 41 lbs he started gaining weight 5 years ago his heaviest weight ever was 260 lbs he is a picky eater and doesn't like to eat healthier foods  he has significant food cravings issues  he snacks frequently in the evenings he is frequently drinking liquids with calories he has problems with excessive hunger  he struggles with emotional eating    Fatigue Roy Avila feels his energy is lower than it should be. This has worsened with weight gain and has not worsened recently. Roy Avila admits to daytime somnolence and  admits to waking up still tired. Patient has a history of obstructive sleep apnea with the use of CPAP. Roy Avila has a history of symptoms of daytime fatigue. Patient generally gets 7 hours of sleep per night, and states they generally have generally  restful sleep. Snoring is not present. Apneic episodes are not present. Epworth Sleepiness Score is 11.  Dyspnea on exertion Roy Avila notes increasing shortness of breath with exercising and seems to be worsening over time with weight gain. He notes getting out of breath sooner with activity than he used to. This has not gotten worse recently. EKG-Normal sinus rhythm. Roy Avila denies orthopnea.  Hypertension Roy Avila is a 68 y.o. male with hypertension. Roy Avila's blood pressure is controlled. He denies chest pain, chest pressure, or headaches. EKG normal sinus rhythm at 76 BPM. He is working on weight loss to help control his blood pressure with the goal of decreasing his risk of heart attack and stroke.   Diabetes II with Hyperglycemia Roy Avila has a diagnosis of diabetes type II. Roy Avila denies GI side effects of metformin. His last LDL was elevated. Last A1c was 6.5 on 11/16/18. He denies hypoglycemia. He has been working on intensive lifestyle modifications including diet, exercise, and weight loss to help control his blood glucose levels.  Obstructive Sleep Apnea Roy Avila has obstructive sleep apnea. He is very compliant with his CPAP. He notes fatigue.  Tobacco Abuse Roy Avila states he can smoke up to 1/2 pack of cigarettes during drinking.  Depression Screen Roy Avila Food and Mood (modified PHQ-9) score was  Depression screen PHQ 2/9 01/18/2019  Decreased Interest 1  Down, Depressed, Hopeless 1  PHQ - 2 Score 2  Altered sleeping 0  Tired, decreased energy 2  Change in appetite 1  Feeling bad or failure about yourself  2  Trouble concentrating  2  Moving slowly or fidgety/restless 1  Suicidal thoughts 0  PHQ-9 Score 10  Difficult doing work/chores Somewhat difficult    ASSESSMENT AND PLAN:  Other fatigue - Plan: EKG 12-Lead, Vitamin B12, VITAMIN D 25 Hydroxy (Vit-D Deficiency, Fractures), T3, T4, free  Shortness of breath on exertion  Essential hypertension  Type 2  diabetes mellitus with hyperglycemia, without long-term current use of insulin (HCC) - Plan: Insulin, random, Microalbumin / creatinine urine ratio  OSA on CPAP  Tobacco abuse  Depression screening  Class 2 severe obesity with serious comorbidity and body mass index (BMI) of 35.0 to 35.9 in adult, unspecified obesity type (HCC)  PLAN:  Fatigue Roy Avila was informed that his fatigue may be related to obesity, depression or many other causes. Labs will be ordered, and in the meanwhile Roy Avila has agreed to work on diet, exercise and weight loss to help with fatigue. Proper sleep hygiene was discussed including the need for 7-8 hours of quality sleep each night. A sleep study was not ordered based on symptoms and Epworth score.  Dyspnea on exertion Roy Avila's shortness of breath appears to be obesity related and exercise induced. He has agreed to work on weight loss and gradually increase exercise to treat his exercise induced shortness of breath. If Roy Avila follows our instructions and loses weight without improvement of his shortness of breath, we will plan to refer to pulmonology. We will monitor this condition regularly. Roy Avila agrees to this plan.  Hypertension We discussed sodium restriction, working on healthy weight loss, and a regular exercise program as the means to achieve improved blood pressure control. Roy Avila agreed with this plan and agreed to follow up as directed. We will continue to monitor his blood pressure as well as his progress with the above lifestyle modifications. Roy Avila will continue his medications as prescribed and will watch for signs of hypotension as he continues his lifestyle modifications. EKG was done today. Roy Avila agrees to follow up with our clinic in 2 weeks.  Diabetes II with Hyperglycemia Roy Avila has been given extensive diabetes education by myself today including ideal fasting and post-prandial blood glucose readings, individual ideal Hgb A1c goals and  hypoglycemia prevention. We discussed the importance of good blood sugar control to decrease the likelihood of diabetic complications such as nephropathy, neuropathy, limb loss, blindness, coronary artery disease, and death. We discussed the importance of intensive lifestyle modification including diet, exercise and weight loss as the first line treatment for diabetes. Roy Avila agrees to continue his diabetes medications and we will check insulin level today. Roy Avila agrees to follow up with our clinic in 2 weeks.  Obstructive Sleep Apnea Roy Avila will follow up with his primary care physician or sleep doctor as needed. Roy Avila agrees to follow up with our clinic in 2 weeks.  Tobacco Abuse We will discuss smoking cessation at next appointment. Roy Avila agrees to follow up with our clinic in 2 weeks.  Depression Screen Roy Avila had a moderately positive depression screening. Depression is commonly associated with obesity and often results in emotional eating behaviors. We will monitor this closely and work on CBT to help improve the non-hunger eating patterns. Referral to Psychology may be required if no improvement is seen as he continues in our clinic.  Obesity Roy Avila is currently in the action stage of change and his goal is to continue with weight loss efforts. I recommend Roy Avila begin the structured treatment plan as follows:  He has agreed to follow the Category 4 plan Roy Avila has been instructed  to eventually work up to a goal of 150 minutes of combined cardio and strengthening exercise per week for weight loss and overall health benefits. We discussed the following Behavioral Modification Strategies today: increasing lean protein intake, increasing vegetables and work on meal planning and easy cooking plans, and planning for success   He was informed of the importance of frequent follow up visits to maximize his success with intensive lifestyle modifications for his multiple health  conditions. He was informed we would discuss his lab results at his next visit unless there is a critical issue that needs to be addressed sooner. Roy Avila agreed to keep his next visit at the agreed upon time to discuss these results.  ALLERGIES: Allergies  Allergen Reactions  . Clarithromycin Rash    MEDICATIONS: Current Outpatient Medications on File Prior to Visit  Medication Sig Dispense Refill  . amLODipine (NORVASC) 5 MG tablet TAKE 1 TABLET DAILY (NEED FOLLOW-UP FOR FURTHER REFILLS) 90 tablet 2  . azithromycin (ZITHROMAX) 250 MG tablet 2 po on day one, then one po daily until gone 6 tablet 0  . doxycycline (VIBRAMYCIN) 100 MG capsule Take 100 mg by mouth 2 (two) times daily.  5  . esomeprazole (NEXIUM) 40 MG capsule TAKE 1 CAPSULE DAILY (NEED FOLLOW-UP FOR FURTHER REFILLS) 90 capsule 4  . metFORMIN (GLUCOPHAGE XR) 750 MG 24 hr tablet Take 1 tablet (750 mg total) by mouth daily with breakfast. 60 tablet 3  . olmesartan-hydrochlorothiazide (BENICAR HCT) 40-25 MG tablet Take 1 tablet by mouth daily. 90 tablet 2   No current facility-administered medications on file prior to visit.     PAST MEDICAL HISTORY: Past Medical History:  Diagnosis Date  . Anxiety   . GERD (gastroesophageal reflux disease)   . Hyperglycemia   . Hyperlipidemia   . Hypertension   . Obstructive sleep apnea syndrome 01/22/2017  . Prediabetes   . Sleep apnea   . SOB (shortness of breath)     PAST SURGICAL HISTORY: Past Surgical History:  Procedure Laterality Date  . HERNIA REPAIR    . none      SOCIAL HISTORY: Social History   Tobacco Use  . Smoking status: Current Some Day Smoker    Years: 30.00    Types: Cigarettes  . Smokeless tobacco: Never Used  . Tobacco comment: Social smoker, 1 pack per week  Substance Use Topics  . Alcohol use: Yes    Comment: socially  . Drug use: No    FAMILY HISTORY: Family History  Problem Relation Age of Onset  . Breast cancer Mother   . Hypertension  Mother   . Obesity Mother   . Heart disease Father   . Hypertension Father   . Hyperlipidemia Father   . Sudden death Father     ROS: Review of Systems  Constitutional: Positive for malaise/fatigue. Negative for weight loss.  HENT: Positive for tinnitus.        + Decreased hearing + Ear drainage   Eyes:       + Wear glasses or contacts + Floaters  Respiratory: Positive for shortness of breath (with exertion).   Cardiovascular: Negative for chest pain and orthopnea.       Negative chest pressure + Difficulty breathing while lying down + Sudden awakening from sleep with shortness of breath  Neurological: Negative for headaches.  Endo/Heme/Allergies:       Negative hypoglycemia    PHYSICAL EXAM: Blood pressure 132/77, pulse 71, temperature 98.3 F (36.8 C), temperature source Oral,  height  (1.803 m), weight 256 lb (116.1 kg), SpO2 95 %. Body mass index is 35.7 kg/m. Physical Exam Vitals signs reviewed.  Constitutional:      Appearance: Normal appearance. He is obese.  HENT:     Head: Normocephalic and atraumatic.     Nose: Nose normal.  Eyes:     General: No scleral icterus.    Extraocular Movements: Extraocular movements intact.  Neck:     Musculoskeletal: Normal range of motion and neck supple.     Comments: No thyromegaly present Cardiovascular:     Rate and Rhythm: Normal rate and regular rhythm.     Pulses: Normal pulses.     Heart sounds: Normal heart sounds.  Pulmonary:     Effort: Pulmonary effort is normal. No respiratory distress.     Breath sounds: Normal breath sounds.  Abdominal:     Palpations: Abdomen is soft.     Tenderness: There is no abdominal tenderness.     Comments: + Obesity  Musculoskeletal: Normal range of motion.     Right lower leg: No edema.     Left lower leg: No edema.  Skin:    General: Skin is warm and dry.  Neurological:     Mental Status: He is alert and oriented to person, place, and time.     Coordination:  Coordination normal.  Psychiatric:        Mood and Affect: Mood normal.        Behavior: Behavior normal.     RECENT LABS AND TESTS: BMET    Component Value Date/Time   NA 141 11/03/2018 0821   NA 142 08/12/2016   K 3.9 11/03/2018 0821   CL 102 11/03/2018 0821   CO2 30 11/03/2018 0821   GLUCOSE 120 (H) 11/03/2018 0821   BUN 14 11/03/2018 0821   BUN 12 08/12/2016   CREATININE 0.86 11/03/2018 0821   CALCIUM 9.5 11/03/2018 0821   Lab Results  Component Value Date   HGBA1C 6.5 11/16/2018   Lab Results  Component Value Date   INSULIN 32.2 (H) 01/18/2019   CBC    Component Value Date/Time   WBC 11.3 (H) 11/03/2018 0821   RBC 4.60 11/03/2018 0821   HGB 14.3 11/03/2018 0821   HCT 42.7 11/03/2018 0821   PLT 320.0 11/03/2018 0821   MCV 92.8 11/03/2018 0821   MCHC 33.4 11/03/2018 0821   RDW 14.4 11/03/2018 0821   LYMPHSABS 2.6 11/03/2018 0821   MONOABS 0.9 11/03/2018 0821   EOSABS 1.3 (H) 11/03/2018 0821   BASOSABS 0.1 11/03/2018 0821   Iron/TIBC/Ferritin/ %Sat No results found for: IRON, TIBC, FERRITIN, IRONPCTSAT Lipid Panel     Component Value Date/Time   CHOL 167 11/03/2018 0821   TRIG 168.0 (H) 11/03/2018 0821   HDL 28.30 (L) 11/03/2018 0821   CHOLHDL 6 11/03/2018 0821   VLDL 33.6 11/03/2018 0821   LDLCALC 105 (H) 11/03/2018 0821   Hepatic Function Panel     Component Value Date/Time   PROT 7.5 11/03/2018 0821   ALBUMIN 4.1 11/03/2018 0821   AST 36 11/03/2018 0821   ALT 52 11/03/2018 0821   ALKPHOS 61 11/03/2018 0821   BILITOT 0.6 11/03/2018 0821      Component Value Date/Time   TSH 1.62 10/05/2017 0915   TSH 2.55 08/12/2016    ECG  shows NSR with a rate of 76 BPM INDIRECT CALORIMETER done today shows a VO2 of 307 and a REE of 2137.  His calculated basal  metabolic rate is 4628 thus his basal metabolic rate is worse than expected.       OBESITY BEHAVIORAL INTERVENTION VISIT  Today's visit was # 1   Starting weight: 256 lbs Starting date:  01/18/2019 Today's weight : 256 lbs  Today's date: 01/18/2019 Total lbs lost to date: 0 At least 15 minutes were spent on discussing the following behavioral intervention visit.   ASK: We discussed the diagnosis of obesity with [name] today and Muril agreed to give [name] permission to discuss obesity behavioral modification therapy today.  ASSESS: Ojas has the diagnosis of obesity and his BMI today is 35.72 Christorpher is in the action stage of change   ADVISE: Dekota was educated on the multiple health risks of obesity as well as the benefit of weight loss to improve his health. He was advised of the need for long term treatment and the importance of lifestyle modifications to improve his current health and to decrease his risk of future health problems.  AGREE: Multiple dietary modification options and treatment options were discussed and  Gennaro agreed to follow the recommendations documented in the above note.  ARRANGE: Jeanmichel was educated on the importance of frequent visits to treat obesity as outlined per CMS and USPSTF guidelines and agreed to schedule his next follow up appointment today.  I, Burt Knack, am acting as transcriptionist for Debbra Riding, MD  I have reviewed the above documentation for accuracy and completeness, and I agree with the above. - Debbra Riding, MD

## 2019-02-01 ENCOUNTER — Encounter (INDEPENDENT_AMBULATORY_CARE_PROVIDER_SITE_OTHER): Payer: Self-pay | Admitting: Family Medicine

## 2019-02-01 ENCOUNTER — Ambulatory Visit (INDEPENDENT_AMBULATORY_CARE_PROVIDER_SITE_OTHER): Payer: Medicare Other | Admitting: Family Medicine

## 2019-02-01 VITALS — BP 111/72 | HR 83 | Temp 97.9°F | Ht 71.0 in | Wt 250.0 lb

## 2019-02-01 DIAGNOSIS — Z6835 Body mass index (BMI) 35.0-35.9, adult: Secondary | ICD-10-CM | POA: Diagnosis not present

## 2019-02-01 DIAGNOSIS — E1165 Type 2 diabetes mellitus with hyperglycemia: Secondary | ICD-10-CM

## 2019-02-01 DIAGNOSIS — E559 Vitamin D deficiency, unspecified: Secondary | ICD-10-CM | POA: Diagnosis not present

## 2019-02-01 DIAGNOSIS — E7849 Other hyperlipidemia: Secondary | ICD-10-CM | POA: Diagnosis not present

## 2019-02-01 MED ORDER — VITAMIN D (ERGOCALCIFEROL) 1.25 MG (50000 UNIT) PO CAPS: 50000.0000 [IU] | ORAL_CAPSULE | ORAL | 0 refills | Status: DC

## 2019-02-01 MED ORDER — ATORVASTATIN CALCIUM 20 MG PO TABS: 20.0000 mg | ORAL_TABLET | Freq: Every day | ORAL | 0 refills | Status: DC

## 2019-02-03 NOTE — Progress Notes (Signed)
Office: [phone]  /  Fax: [phone]   HPI:   Chief Complaint: OBESITY Roy Avila is here to discuss his progress with his obesity treatment plan. He is on the Category 4 plan and is following his eating plan approximately 15-20 % of the time. He states he is exercising 0 minutes 0 times per week. Roy Avila is enjoying the plan, he did eat Cheerios for breakfast and banana for snack between breakfast and lunch. At lunch he is not weighing food, not weighing at dinner, not eating fruit, cottage cheese, or yogurt, and he is not eating 2 cups of vegetables at dinner. His weight is 250 lb (113.4 kg) today and has had a weight loss of 6 pounds over a period of 2 weeks since his last visit. He has lost 6 lbs since starting treatment with [name].  Diabetes II with Hyperglycemia Roy Avila has a diagnosis of diabetes type II. Roy Avila is on metformin 750 mg PO daily. He denies GI side effects. Moderate intensity statin encouraged by ASCVD calculation. Last A1c was 6.5. He denies hypoglycemia. He has been working on intensive lifestyle modifications including diet, exercise, and weight loss to help control his blood glucose levels.  Vitamin D Deficiency Roy Avila has a diagnosis of vitamin D deficiency. He is not currently taking OTC Vit D. He notes fatigue and denies nausea, vomiting or muscle weakness.  Hyperlipidemia Roy Avila has hyperlipidemia and has been trying to improve his cholesterol levels with intensive lifestyle modification including a low saturated fat diet, exercise and weight loss. His LDL and triglycerides are elevated and HDL is low. He denies any chest pain, claudication or myalgias.  ASSESSMENT AND PLAN:  Type 2 diabetes mellitus with hyperglycemia, without long-term current use of insulin (HCC)  Vitamin D deficiency - Plan: Vitamin D, Ergocalciferol, (DRISDOL) 1.25 MG (50000 UT) CAPS capsule  Other hyperlipidemia - Plan: atorvastatin (LIPITOR) 20 MG tablet  Class 2 severe obesity  with serious comorbidity and body mass index (BMI) of 35.0 to 35.9 in adult, unspecified obesity type (HCC)  PLAN:  Diabetes II with Hyperglycemia Roy Avila has been given extensive diabetes education by myself today including ideal fasting and post-prandial blood glucose readings, individual ideal Hgb A1c goals and hypoglycemia prevention. We discussed the importance of good blood sugar control to decrease the likelihood of diabetic complications such as nephropathy, neuropathy, limb loss, blindness, coronary artery disease, and death. We discussed the importance of intensive lifestyle modification including diet, exercise and weight loss as the first line treatment for diabetes. Roy Avila agrees to continue taking metformin and he agrees to follow up with our clinic in 2 weeks.  Vitamin D Deficiency Roy Avila was informed that low vitamin D levels contributes to fatigue and are associated with obesity, breast, and colon cancer. Roy Avila agrees to start prescription Vit D ,000 IU every week #4 with no refills. He will follow up for routine testing of vitamin D, at least 2-3 times per year. He was informed of the risk of over-replacement of vitamin D and agrees to not increase his dose unless he discusses this with [name] first. Roy Avila agrees to follow up with our clinic in 2 weeks.  Hyperlipidemia Roy Avila was informed of the American Heart Association Guidelines emphasizing intensive lifestyle modifications as the first line treatment for hyperlipidemia. We discussed many lifestyle modifications today in depth, and Roy Avila will continue to work on decreasing saturated fats such as fatty red meat, butter and many fried foods. Roy Avila agrees to start atorvastatin 20 mg PO daily #30 with  no refills. He will also increase vegetables and lean protein in his diet and continue to work on exercise and weight loss efforts. Roy Avila agrees to follow up with our clinic in 2 weeks.  Obesity Roy Avila is currently in the  action stage of change. As such, his goal is to continue with weight loss efforts He has agreed to follow the Category 4 plan Roy Avila has been instructed to work up to a goal of 150 minutes of combined cardio and strengthening exercise per week for weight loss and overall health benefits. We discussed the following Behavioral Modification Strategies today: increasing lean protein intake, increasing vegetables, work on meal planning and easy cooking plans, better snacking choices, and planning for success   Roy Avila has agreed to follow up with our clinic in 2 weeks. He was informed of the importance of frequent follow up visits to maximize his success with intensive lifestyle modifications for his multiple health conditions.  ALLERGIES: Allergies  Allergen Reactions  . Clarithromycin Rash    MEDICATIONS: Current Outpatient Medications on File Prior to Visit  Medication Sig Dispense Refill  . amLODipine (NORVASC) 5 MG tablet TAKE 1 TABLET DAILY (NEED FOLLOW-UP FOR FURTHER REFILLS) 90 tablet 2  . azithromycin (ZITHROMAX) 250 MG tablet 2 po on day one, then one po daily until gone 6 tablet 0  . doxycycline (VIBRAMYCIN) 100 MG capsule Take 100 mg by mouth 2 (two) times daily.  5  . esomeprazole (NEXIUM) 40 MG capsule TAKE 1 CAPSULE DAILY (NEED FOLLOW-UP FOR FURTHER REFILLS) 90 capsule 4  . metFORMIN (GLUCOPHAGE XR) 750 MG 24 hr tablet Take 1 tablet (750 mg total) by mouth daily with breakfast. 60 tablet 3  . olmesartan-hydrochlorothiazide (BENICAR HCT) 40-25 MG tablet Take 1 tablet by mouth daily. 90 tablet 2   No current facility-administered medications on file prior to visit.     PAST MEDICAL HISTORY: Past Medical History:  Diagnosis Date  . Anxiety   . GERD (gastroesophageal reflux disease)   . Hyperglycemia   . Hyperlipidemia   . Hypertension   . Obstructive sleep apnea syndrome 01/22/2017  . Prediabetes   . Sleep apnea   . SOB (shortness of breath)     PAST SURGICAL  HISTORY: Past Surgical History:  Procedure Laterality Date  . HERNIA REPAIR    . none      SOCIAL HISTORY: Social History   Tobacco Use  . Smoking status: Current Some Day Smoker    Years: 30.00    Types: Cigarettes  . Smokeless tobacco: Never Used  . Tobacco comment: Social smoker, 1 pack per week  Substance Use Topics  . Alcohol use: Yes    Comment: socially  . Drug use: No    FAMILY HISTORY: Family History  Problem Relation Age of Onset  . Breast cancer Mother   . Hypertension Mother   . Obesity Mother   . Heart disease Father   . Hypertension Father   . Hyperlipidemia Father   . Sudden death Father     ROS: Review of Systems  Constitutional: Positive for malaise/fatigue and weight loss.  Cardiovascular: Negative for chest pain and claudication.  Gastrointestinal: Negative for nausea and vomiting.  Musculoskeletal: Negative for myalgias.       Negative muscle weakness  Endo/Heme/Allergies:       Negative hypoglycemia    PHYSICAL EXAM: Blood pressure 111/72, pulse 83, temperature 97.9 F (36.6 C), temperature source Oral, height 5\' 11"  (1.803 m), weight 250 lb (113.4 kg), SpO2 98 %.  Body mass index is 34.87 kg/m. Physical Exam Vitals signs reviewed.  Constitutional:      Appearance: Normal appearance. He is obese.  Cardiovascular:     Rate and Rhythm: Normal rate.     Pulses: Normal pulses.  Pulmonary:     Effort: Pulmonary effort is normal.     Breath sounds: Normal breath sounds.  Musculoskeletal: Normal range of motion.  Skin:    General: Skin is warm and dry.  Neurological:     Mental Status: He is alert and oriented to person, place, and time.  Psychiatric:        Mood and Affect: Mood normal.        Behavior: Behavior normal.     RECENT LABS AND TESTS: BMET    Component Value Date/Time   NA 141 11/03/2018 0821   NA 142 08/12/2016   K 3.9 11/03/2018 0821   CL 102 11/03/2018 0821   CO2 30 11/03/2018 0821   GLUCOSE 120 (H)  11/03/2018 0821   BUN 14 11/03/2018 0821   BUN 12 08/12/2016   CREATININE 0.86 11/03/2018 0821   CALCIUM 9.5 11/03/2018 0821   Lab Results  Component Value Date   HGBA1C 6.5 11/16/2018   HGBA1C 6.2 10/05/2017   HGBA1C 6.3 08/12/2016   Lab Results  Component Value Date   INSULIN 32.2 (H) 01/18/2019   CBC    Component Value Date/Time   WBC 11.3 (H) 11/03/2018 0821   RBC 4.60 11/03/2018 0821   HGB 14.3 11/03/2018 0821   HCT 42.7 11/03/2018 0821   PLT 320.0 11/03/2018 0821   MCV 92.8 11/03/2018 0821   MCHC 33.4 11/03/2018 0821   RDW 14.4 11/03/2018 0821   LYMPHSABS 2.6 11/03/2018 0821   MONOABS 0.9 11/03/2018 0821   EOSABS 1.3 (H) 11/03/2018 0821   BASOSABS 0.1 11/03/2018 0821   Iron/TIBC/Ferritin/ %Sat No results found for: IRON, TIBC, FERRITIN, IRONPCTSAT Lipid Panel     Component Value Date/Time   CHOL 167 11/03/2018 0821   TRIG 168.0 (H) 11/03/2018 0821   HDL 28.30 (L) 11/03/2018 0821   CHOLHDL 6 11/03/2018 0821   VLDL 33.6 11/03/2018 0821   LDLCALC 105 (H) 11/03/2018 0821   Hepatic Function Panel     Component Value Date/Time   PROT 7.5 11/03/2018 0821   ALBUMIN 4.1 11/03/2018 0821   AST 36 11/03/2018 0821   ALT 52 11/03/2018 0821   ALKPHOS 61 11/03/2018 0821   BILITOT 0.6 11/03/2018 0821      Component Value Date/Time   TSH 1.62 10/05/2017 0915   TSH 2.55 08/12/2016      OBESITY BEHAVIORAL INTERVENTION VISIT  Today's visit was # 2   Starting weight: 256 lbs Starting date: 01/18/2019 Today's weight : 250 lbs Today's date: 02/01/2019 Total lbs lost to date: 6 At least 15 minutes were spent on discussing the following behavioral intervention visit.    02/01/2019  Height  (1.803 m)  Weight 250 lb (113.4 kg)  BMI (Calculated) 34.88  BLOOD PRESSURE - SYSTOLIC 111  BLOOD PRESSURE - DIASTOLIC 72   Body Fat % 35.3 %  Total Body Water (lbs) 107.4 lbs    ASK: We discussed the diagnosis of obesity with Roy Avila today and Roy Avila agreed  to give [name] permission to discuss obesity behavioral modification therapy today.  ASSESS: Roy Avila has the diagnosis of obesity and his BMI today is 34.88 Roy Avila is in the action stage of change   ADVISE: Roy Avila was educated on the multiple  health risks of obesity as well as the benefit of weight loss to improve his health. He was advised of the need for long term treatment and the importance of lifestyle modifications to improve his current health and to decrease his risk of future health problems.  AGREE: Multiple dietary modification options and treatment options were discussed and  Roy Avila agreed to follow the recommendations documented in the above note.  ARRANGE: Roy Avila was educated on the importance of frequent visits to treat obesity as outlined per CMS and USPSTF guidelines and agreed to schedule his next follow up appointment today.  I, Burt Knack, am acting as transcriptionist for Debbra Riding, MD  I have reviewed the above documentation for accuracy and completeness, and I agree with the above. - Debbra Riding, MD

## 2019-02-21 ENCOUNTER — Ambulatory Visit (INDEPENDENT_AMBULATORY_CARE_PROVIDER_SITE_OTHER): Payer: Medicare Other | Admitting: Family Medicine

## 2019-03-08 ENCOUNTER — Other Ambulatory Visit (INDEPENDENT_AMBULATORY_CARE_PROVIDER_SITE_OTHER): Payer: Self-pay | Admitting: Family Medicine

## 2019-03-08 DIAGNOSIS — E7849 Other hyperlipidemia: Secondary | ICD-10-CM

## 2019-04-04 ENCOUNTER — Other Ambulatory Visit: Payer: Self-pay

## 2019-04-04 DIAGNOSIS — E8881 Metabolic syndrome: Secondary | ICD-10-CM

## 2019-04-04 MED ORDER — METFORMIN HCL ER 750 MG PO TB24: 750.0000 mg | ORAL_TABLET | Freq: Every day | ORAL | 0 refills | Status: DC

## 2019-04-04 NOTE — Telephone Encounter (Signed)
Last OV 12/05/18 Last refill 12/05/18 #60/3 Next OV not scheduled

## 2019-04-04 NOTE — Telephone Encounter (Signed)
Called pt and schedule virtual visit with Dr. Earlene Plater 04/10/2019.

## 2019-04-05 ENCOUNTER — Other Ambulatory Visit (INDEPENDENT_AMBULATORY_CARE_PROVIDER_SITE_OTHER): Payer: Self-pay | Admitting: Family Medicine

## 2019-04-05 DIAGNOSIS — E7849 Other hyperlipidemia: Secondary | ICD-10-CM

## 2019-04-10 ENCOUNTER — Ambulatory Visit: Payer: Medicare Other | Admitting: Family Medicine

## 2019-05-11 ENCOUNTER — Telehealth: Payer: Self-pay | Admitting: Family Medicine

## 2019-05-11 NOTE — Telephone Encounter (Signed)
See note

## 2019-05-11 NOTE — Telephone Encounter (Signed)
Copied from [place] [id number]. Topic: Quick Communication - Rx Refill/Question >> May 11, 2019  2:59 PM Gustavus Messing wrote: Medication: metFORMIN (GLUCOPHAGE XR) 750 MG 24 hr tablet   Has the patient contacted their pharmacy? No. (Agent: If no, request that the patient contact the pharmacy for the refill.) The patient called because this medication is being recalled and he wanted to know Dr. Juleen China to know and to prescribe him something else.    Preferred Pharmacy (with phone number or street name): [facility], [place] [phone] (Phone) [phone] (Fax)    Agent: Please be advised that RX refills may take up to 3 business days. We ask that you follow-up with your pharmacy.

## 2019-05-15 ENCOUNTER — Other Ambulatory Visit: Payer: Self-pay

## 2019-05-15 ENCOUNTER — Telehealth: Payer: Self-pay | Admitting: Family Medicine

## 2019-05-15 DIAGNOSIS — E8881 Metabolic syndrome: Secondary | ICD-10-CM

## 2019-05-15 DIAGNOSIS — E7849 Other hyperlipidemia: Secondary | ICD-10-CM

## 2019-05-15 MED ORDER — ATORVASTATIN CALCIUM 20 MG PO TABS: 20.0000 mg | ORAL_TABLET | Freq: Every day | ORAL | 0 refills | Status: DC

## 2019-05-15 MED ORDER — METFORMIN HCL ER 750 MG PO TB24: 750.0000 mg | ORAL_TABLET | Freq: Every day | ORAL | 0 refills | Status: DC

## 2019-05-15 NOTE — Telephone Encounter (Signed)
RX refill request Last fill 04/05/19 #30/0 (Lipitor)  Metformin #90/0 Last office visit 12/05/18

## 2019-05-15 NOTE — Telephone Encounter (Signed)
Patient refill called in today.

## 2019-05-15 NOTE — Telephone Encounter (Unsigned)
Copied from [place] [id number]. Topic: Quick Communication - Rx Refill/Question >> May 15, 2019 11:01 AM Alanda Slim E wrote: Medication: atorvastatin (LIPITOR) 20 MG tablet - Pt is completely out  Has the patient contacted their pharmacy? No   Preferred Pharmacy (with phone number or street name): [id number] - [place], [place] - [address] [name] [name] [phone] (Phone) [phone] (Fax)    Agent: Please be advised that RX refills may take up to 3 business days. We ask that you follow-up with your pharmacy.

## 2019-05-17 ENCOUNTER — Other Ambulatory Visit (INDEPENDENT_AMBULATORY_CARE_PROVIDER_SITE_OTHER): Payer: Self-pay | Admitting: Family Medicine

## 2019-05-17 DIAGNOSIS — E7849 Other hyperlipidemia: Secondary | ICD-10-CM

## 2019-05-26 ENCOUNTER — Other Ambulatory Visit (INDEPENDENT_AMBULATORY_CARE_PROVIDER_SITE_OTHER): Payer: Self-pay | Admitting: Family Medicine

## 2019-05-26 DIAGNOSIS — E7849 Other hyperlipidemia: Secondary | ICD-10-CM

## 2019-07-15 ENCOUNTER — Other Ambulatory Visit (INDEPENDENT_AMBULATORY_CARE_PROVIDER_SITE_OTHER): Payer: Self-pay | Admitting: Family Medicine

## 2019-07-15 DIAGNOSIS — E7849 Other hyperlipidemia: Secondary | ICD-10-CM

## 2019-07-16 NOTE — Telephone Encounter (Signed)
Last fill 05/27/19  #30/0 Last OV 12/05/18 Next OV 07/18/19

## 2019-07-18 ENCOUNTER — Encounter: Payer: Medicare Other | Admitting: Family Medicine

## 2019-07-18 ENCOUNTER — Telehealth: Payer: Self-pay

## 2019-07-18 ENCOUNTER — Ambulatory Visit (INDEPENDENT_AMBULATORY_CARE_PROVIDER_SITE_OTHER): Payer: Medicare Other | Admitting: Family Medicine

## 2019-07-18 ENCOUNTER — Encounter: Payer: Self-pay | Admitting: Family Medicine

## 2019-07-18 VITALS — Temp 97.8°F | Ht 71.0 in | Wt 253.0 lb

## 2019-07-18 DIAGNOSIS — R05 Cough: Secondary | ICD-10-CM

## 2019-07-18 DIAGNOSIS — R059 Cough, unspecified: Secondary | ICD-10-CM

## 2019-07-18 NOTE — Telephone Encounter (Signed)
PT was supposed to come in tomorrow 8/20 for cpe and labs. Pt is rescheduled for cpe 8/31 and wants to know if he can come in this week  For labs. Please advise.

## 2019-07-18 NOTE — Progress Notes (Signed)
Patient: Roy Avila MRN: [id number] DOB: 10/07/1951 PCP: Helane RimaWallace, Erica, DO     I connected with Roy EarlJeffrey Rochon on 07/18/19 at 3:08pm by a video enabled telemedicine application and verified that I am speaking with the correct person using two identifiers.  Location patient: Home Location provider: [facility], Office Persons participating in this virtual visit: Margaret PyleJeffery Schrier and Dr. Artis FlockWolfe   I discussed the limitations of evaluation and management by telemedicine and the availability of in person appointments. The patient expressed understanding and agreed to proceed.   Subjective:  Chief Complaint  Patient presents with  . Cough  . CHEST CONGESTION    HPI: The patient is a 68 y.o. male who presents today for cough and chest congestion. Started about one week ago. Cough is productive with yellow mucous. He has been taking mucinex prn. It is better today than it's been all week. No fever/chills, shortness of breath. Mild congestion, but no sinus pain or pressure. He did have some diarrhea, but this has resolved. He does occasionally smoke. No copd/asthma or lung history. No known covid exposure.   Review of Systems  Constitutional: Negative for chills, fatigue and fever.  HENT: Positive for congestion. Negative for ear pain, postnasal drip, rhinorrhea, sinus pressure, sinus pain, sore throat and trouble swallowing.   Eyes: Negative for visual disturbance.  Respiratory: Positive for cough. Negative for shortness of breath.   Cardiovascular: Negative.   Gastrointestinal: Positive for diarrhea. Negative for abdominal pain, nausea and vomiting.  Endocrine: Negative for polydipsia and polyuria.  Musculoskeletal: Negative for arthralgias, back pain, myalgias and neck pain.  Skin: Negative for rash.  Neurological: Negative for dizziness and headaches.  Psychiatric/Behavioral: Positive for sleep disturbance.    Allergies Patient is allergic to clarithromycin.  Past Medical  History Patient  has a past medical history of Anxiety, GERD (gastroesophageal reflux disease), Hyperglycemia, Hyperlipidemia, Hypertension, Obstructive sleep apnea syndrome (01/22/2017), Prediabetes, Sleep apnea, and SOB (shortness of breath).  Surgical History Patient  has a past surgical history that includes none and Hernia repair.  Family History Pateint's family history includes Breast cancer in his mother; Heart disease in his father; Hyperlipidemia in his father; Hypertension in his father and mother; Obesity in his mother; Sudden death in his father.  Social History Patient  reports that he has been smoking cigarettes. He has smoked for the past 30.00 years. He has never used smokeless tobacco. He reports current alcohol use. He reports that he does not use drugs.    Objective: Vitals:   07/18/19 1416  Temp: 97.8 F (36.6 C)  TempSrc: Oral  Weight: 253 lb (114.8 kg)  Height: 5\' 11"  (1.803 m)    Body mass index is 35.29 kg/m.  Physical Exam Vitals signs reviewed.  Constitutional:      Appearance: Normal appearance.  HENT:     Head: Normocephalic and atraumatic.  Eyes:     Extraocular Movements: Extraocular movements intact.  Pulmonary:     Effort: Pulmonary effort is normal.  Neurological:     General: No focal deficit present.     Mental Status: He is alert and oriented to person, place, and time.        Assessment/plan: 1. Cough Viral in nature. Discussed no indication for antibiotics at this time. Continue conservative treatment with rest, fluids, honey (conservatively with insulin resistance) and otc cough medication. Recommended he try robitussin DM. Offered tessalon pearls, but he declined. Discussed if not getting better by 12 days or worsening symptoms he is to  call back.    Return if symptoms worsen or fail to improve.   Orma Flaming, MD [place]  07/18/2019

## 2019-07-18 NOTE — Telephone Encounter (Signed)
I called pt to schedule lab earlier the same day of ov, due to pt wanting to fast for labs.  Are there any other labs that you want me to add for pt.  I know CBC, CMP, Lipid, A1c.

## 2019-07-19 ENCOUNTER — Other Ambulatory Visit: Payer: Self-pay

## 2019-07-19 DIAGNOSIS — I10 Essential (primary) hypertension: Secondary | ICD-10-CM

## 2019-07-19 DIAGNOSIS — E7849 Other hyperlipidemia: Secondary | ICD-10-CM

## 2019-07-19 DIAGNOSIS — E1165 Type 2 diabetes mellitus with hyperglycemia: Secondary | ICD-10-CM

## 2019-07-19 NOTE — Telephone Encounter (Signed)
Ok.  Orders in for labs.

## 2019-07-19 NOTE — Telephone Encounter (Signed)
Those are great. Thanks!

## 2019-07-30 ENCOUNTER — Other Ambulatory Visit: Payer: Self-pay

## 2019-07-30 ENCOUNTER — Ambulatory Visit (INDEPENDENT_AMBULATORY_CARE_PROVIDER_SITE_OTHER): Payer: Medicare Other | Admitting: Family Medicine

## 2019-07-30 ENCOUNTER — Encounter: Payer: Self-pay | Admitting: Family Medicine

## 2019-07-30 ENCOUNTER — Other Ambulatory Visit (INDEPENDENT_AMBULATORY_CARE_PROVIDER_SITE_OTHER): Payer: Medicare Other

## 2019-07-30 VITALS — BP 126/76 | HR 77 | Temp 98.6°F | Ht 71.0 in | Wt 255.8 lb

## 2019-07-30 DIAGNOSIS — I10 Essential (primary) hypertension: Secondary | ICD-10-CM

## 2019-07-30 DIAGNOSIS — Z23 Encounter for immunization: Secondary | ICD-10-CM

## 2019-07-30 DIAGNOSIS — E7849 Other hyperlipidemia: Secondary | ICD-10-CM

## 2019-07-30 DIAGNOSIS — E785 Hyperlipidemia, unspecified: Secondary | ICD-10-CM

## 2019-07-30 DIAGNOSIS — E88819 Insulin resistance, unspecified: Secondary | ICD-10-CM

## 2019-07-30 DIAGNOSIS — Z9989 Dependence on other enabling machines and devices: Secondary | ICD-10-CM

## 2019-07-30 DIAGNOSIS — E1165 Type 2 diabetes mellitus with hyperglycemia: Secondary | ICD-10-CM

## 2019-07-30 DIAGNOSIS — F5102 Adjustment insomnia: Secondary | ICD-10-CM | POA: Diagnosis not present

## 2019-07-30 DIAGNOSIS — R351 Nocturia: Secondary | ICD-10-CM | POA: Diagnosis not present

## 2019-07-30 DIAGNOSIS — Z0000 Encounter for general adult medical examination without abnormal findings: Secondary | ICD-10-CM

## 2019-07-30 DIAGNOSIS — K219 Gastro-esophageal reflux disease without esophagitis: Secondary | ICD-10-CM

## 2019-07-30 DIAGNOSIS — R945 Abnormal results of liver function studies: Secondary | ICD-10-CM

## 2019-07-30 DIAGNOSIS — E8881 Metabolic syndrome: Secondary | ICD-10-CM

## 2019-07-30 DIAGNOSIS — G4733 Obstructive sleep apnea (adult) (pediatric): Secondary | ICD-10-CM

## 2019-07-30 DIAGNOSIS — R7989 Other specified abnormal findings of blood chemistry: Secondary | ICD-10-CM

## 2019-07-30 DIAGNOSIS — F17210 Nicotine dependence, cigarettes, uncomplicated: Secondary | ICD-10-CM

## 2019-07-30 LAB — CBC WITH DIFFERENTIAL/PLATELET
Basophils Absolute: 0.1 10*3/uL (ref 0.0–0.1)
Basophils Relative: 0.8 % (ref 0.0–3.0)
Eosinophils Absolute: 1.1 10*3/uL — ABNORMAL HIGH (ref 0.0–0.7)
Eosinophils Relative: 10.1 % — ABNORMAL HIGH (ref 0.0–5.0)
HCT: 39.7 % (ref 39.0–52.0)
Hemoglobin: 13.2 g/dL (ref 13.0–17.0)
Lymphocytes Relative: 22.8 % (ref 12.0–46.0)
Lymphs Abs: 2.5 10*3/uL (ref 0.7–4.0)
MCHC: 33.3 g/dL (ref 30.0–36.0)
MCV: 92 fl (ref 78.0–100.0)
Monocytes Absolute: 0.6 10*3/uL (ref 0.1–1.0)
Monocytes Relative: 5.7 % (ref 3.0–12.0)
Neutro Abs: 6.7 10*3/uL (ref 1.4–7.7)
Neutrophils Relative %: 60.6 % (ref 43.0–77.0)
Platelets: 340 10*3/uL (ref 150.0–400.0)
RBC: 4.32 Mil/uL (ref 4.22–5.81)
RDW: 14.5 % (ref 11.5–15.5)
WBC: 11 10*3/uL — ABNORMAL HIGH (ref 4.0–10.5)

## 2019-07-30 LAB — LIPID PANEL
Cholesterol: 108 mg/dL (ref 0–200)
HDL: 23.9 mg/dL — ABNORMAL LOW (ref >39.00)
LDL Cholesterol: 49 mg/dL (ref 0–99)
NonHDL: 83.6
Total CHOL/HDL Ratio: 4
Triglycerides: 174 mg/dL — ABNORMAL HIGH (ref 0.0–149.0)
VLDL: 34.8 mg/dL (ref 0.0–40.0)

## 2019-07-30 LAB — COMPREHENSIVE METABOLIC PANEL
ALT: 29 U/L (ref 0–53)
AST: 22 U/L (ref 0–37)
Albumin: 4 g/dL (ref 3.5–5.2)
Alkaline Phosphatase: 87 U/L (ref 39–117)
BUN: 12 mg/dL (ref 6–23)
CO2: 30 mEq/L (ref 19–32)
Calcium: 9 mg/dL (ref 8.4–10.5)
Chloride: 101 mEq/L (ref 96–112)
Creatinine, Ser: 0.82 mg/dL (ref 0.40–1.50)
GFR: 93.64 mL/min (ref >60.00)
Glucose, Bld: 111 mg/dL — ABNORMAL HIGH (ref 70–99)
Potassium: 3.3 mEq/L — ABNORMAL LOW (ref 3.5–5.1)
Sodium: 140 mEq/L (ref 135–145)
Total Bilirubin: 0.6 mg/dL (ref 0.2–1.2)
Total Protein: 7.1 g/dL (ref 6.0–8.3)

## 2019-07-30 LAB — HEMOGLOBIN A1C: Hgb A1c MFr Bld: 6.8 % — ABNORMAL HIGH (ref 4.6–6.5)

## 2019-07-30 MED ORDER — TRAZODONE HCL 50 MG PO TABS: 25.0000 mg | ORAL_TABLET | Freq: Every evening | ORAL | 3 refills | Status: DC | PRN

## 2019-07-30 NOTE — Progress Notes (Signed)
Subjective:    Roland EarlJeffrey Capote is a 68 y.o. male who presents today for his Complete Annual Exam.  Complains of feeling very tired.  Naps in the middle of most days.  Not exercising or moving around very much.  Wakes for about 2 hours each night due to anxiety.  Admits that he feels better when he works in his garden.  No new medications or supplements.  OSA with CPAP.  Labs pending.  Previous labs  Current Outpatient Medications:  .  amLODipine (NORVASC) 5 MG tablet, TAKE 1 TABLET DAILY (NEED FOLLOW-UP FOR FURTHER REFILLS), Disp: 90 tablet, Rfl: 2 .  atorvastatin (LIPITOR) 20 MG tablet, TAKE 1 TABLET BY MOUTH EVERY DAY, Disp: 30 tablet, Rfl: 0 .  esomeprazole (NEXIUM) 40 MG capsule, TAKE 1 CAPSULE DAILY (NEED FOLLOW-UP FOR FURTHER REFILLS), Disp: 90 capsule, Rfl: 4 .  metFORMIN (GLUCOPHAGE XR) 750 MG 24 hr tablet, Take 1 tablet (750 mg total) by mouth daily with breakfast., Disp: 90 tablet, Rfl: 0 .  olmesartan-hydrochlorothiazide (BENICAR HCT) 40-25 MG tablet, Take 1 tablet by mouth daily., Disp: 90 tablet, Rfl: 2  Health Maintenance Due  Topic Date Due  . INFLUENZA VACCINE  06/30/2019   PMHx, SurgHx, SocialHx, Medications, and Allergies were reviewed in the Visit Navigator and updated as appropriate.   Past Medical History:  Diagnosis Date  . Anxiety   . GERD (gastroesophageal reflux disease)   . Hyperlipidemia   . Hypertension   . Obstructive sleep apnea syndrome 01/22/2017  . Prediabetes   . Sleep apnea      Past Surgical History:  Procedure Laterality Date  . HERNIA REPAIR       Family History  Problem Relation Age of Onset  . Breast cancer Mother   . Hypertension Mother   . Obesity Mother   . Heart disease Father   . Hypertension Father   . Hyperlipidemia Father   . Sudden death Father     Social History   Tobacco Use  . Smoking status: Current Some Day Smoker    Years: 30.00    Types: Cigarettes  . Smokeless tobacco: Never Used  . Tobacco comment:  Social smoker, 1 pack per week  Substance Use Topics  . Alcohol use: Yes    Comment: socially  . Drug use: No   Review of Systems:   Pertinent items are noted in the HPI. Otherwise, ROS is negative.  Objective:    Vitals:   07/30/19 1104  BP: 126/76  Pulse: 77  Temp: 98.6 F (37 C)  SpO2: 96%   Body mass index is 35.68 kg/m.  General  Alert, cooperative, no distress, appears stated age  Head:  Normocephalic, without obvious abnormality, atraumatic  Eyes:  PERRL, conjunctiva/corneas clear, EOM's intact, fundi benign, both eyes       Ears:  Normal TM's and external ear canals, both ears  Nose: Nares normal, septum midline, mucosa normal, no drainage or sinus tenderness  Throat: Lips, mucosa, and tongue normal; teeth and gums normal  Neck: Supple, symmetrical, trachea midline, no adenopathy; thyroid: no enlargement/tenderness/nodules; no carotid bruit or JVD  Back:   Symmetric, no curvature, ROM normal, no CVA tenderness  Lungs:   Clear to auscultation bilaterally, respirations unlabored  Chest Wall:  No tenderness or deformity  Heart:  Regular rate and rhythm, S1 and S2 normal, no murmur, rub or gallop  Abdomen:   Soft, non-tender, bowel sounds active all four quadrants, no masses, no organomegaly  Extremities: Extremities normal,  atraumatic, no cyanosis or edema  Prostate: Not done   Skin: Skin color, texture, turgor normal, no rashes or lesions  Lymph: Cervical, supraclavicular, and axillary nodes normal  Neurologic: CNII-XII grossly intact. Normal strength, sensation and reflexes throughout   AssessmentPlan:   Kimberley was seen today for annual exam.  Diagnoses and all orders for this visit:  Routine physical examination  Nocturia -     PSA  Adjustment insomnia Comments: See orders. Discussed exercise for fatigue. Orders: -     traZODone (DESYREL) 50 MG tablet; Take 0.5-1 tablets (25-50 mg total) by mouth at bedtime as needed for sleep.  Morbid obesity  ([place]) Comments: Encouraged [facility] at [facility].  Insulin resistance  Dyslipidemia (high LDL; low HDL)  Abnormal LFTs (liver function tests), monitoring, likely due to fatty liver, may need [name]  OSA on CPAP  Cigarette nicotine dependence without complication Comments: Counseled to quit.  Gastroesophageal reflux disease without esophagitis, on Nexium  HTN, goal below 130/80    Patient Counseling: [x]   Nutrition: Stressed importance of moderation in sodium/caffeine intake, saturated fat and cholesterol, caloric balance, sufficient intake of fresh fruits, vegetables, and fiber  [x]   Stressed the importance of regular exercise.   []   Substance Abuse: Discussed cessation/primary prevention of tobacco, alcohol, or other drug use; driving or other dangerous activities under the influence; availability of treatment for abuse.   [x]   Injury prevention: Discussed safety belts, safety helmets, smoke detector, smoking near bedding or upholstery.   []   Sexuality: Discussed sexually transmitted diseases, partner selection, use of condoms, avoidance of unintended pregnancy  and contraceptive alternatives.   [x]   Dental health: Discussed importance of regular tooth brushing, flossing, and dental visits.  [x]   Health maintenance and immunizations reviewed. Please refer to Health maintenance section.   Briscoe Deutscher, DO [place]

## 2019-08-11 ENCOUNTER — Other Ambulatory Visit (INDEPENDENT_AMBULATORY_CARE_PROVIDER_SITE_OTHER): Payer: Self-pay | Admitting: Family Medicine

## 2019-08-11 DIAGNOSIS — E7849 Other hyperlipidemia: Secondary | ICD-10-CM

## 2019-08-13 NOTE — Telephone Encounter (Signed)
Last fill 07/16/19  #30/0 Last OV 07/30/19

## 2019-08-16 ENCOUNTER — Other Ambulatory Visit: Payer: Self-pay

## 2019-08-16 ENCOUNTER — Telehealth: Payer: Self-pay | Admitting: Physical Therapy

## 2019-08-16 DIAGNOSIS — R351 Nocturia: Secondary | ICD-10-CM

## 2019-08-16 DIAGNOSIS — R899 Unspecified abnormal finding in specimens from other organs, systems and tissues: Secondary | ICD-10-CM

## 2019-08-16 NOTE — Telephone Encounter (Signed)
Copied from [place] [id number]. Topic: General - Other >> Aug 16, 2019  2:57 PM Leward Quan A wrote: Reason for CRM: Patient called and requested that the results of his last labs are sent to him by mail please.

## 2019-08-17 NOTE — Telephone Encounter (Signed)
Results mailed 

## 2019-08-23 ENCOUNTER — Other Ambulatory Visit (INDEPENDENT_AMBULATORY_CARE_PROVIDER_SITE_OTHER): Payer: Medicare Other

## 2019-08-23 ENCOUNTER — Other Ambulatory Visit: Payer: Self-pay

## 2019-08-23 DIAGNOSIS — R351 Nocturia: Secondary | ICD-10-CM

## 2019-08-23 LAB — COMPREHENSIVE METABOLIC PANEL
ALT: 27 U/L (ref 0–53)
AST: 22 U/L (ref 0–37)
Albumin: 3.9 g/dL (ref 3.5–5.2)
Alkaline Phosphatase: 85 U/L (ref 39–117)
BUN: 12 mg/dL (ref 6–23)
CO2: 30 mEq/L (ref 19–32)
Calcium: 9.5 mg/dL (ref 8.4–10.5)
Chloride: 100 mEq/L (ref 96–112)
Creatinine, Ser: 0.78 mg/dL (ref 0.40–1.50)
GFR: 99.19 mL/min (ref >60.00)
Glucose, Bld: 115 mg/dL — ABNORMAL HIGH (ref 70–99)
Potassium: 3.3 mEq/L — ABNORMAL LOW (ref 3.5–5.1)
Sodium: 139 mEq/L (ref 135–145)
Total Bilirubin: 0.6 mg/dL (ref 0.2–1.2)
Total Protein: 6.8 g/dL (ref 6.0–8.3)

## 2019-08-23 LAB — PSA: PSA: 0.23 ng/mL (ref 0.10–4.00)

## 2019-08-24 ENCOUNTER — Other Ambulatory Visit (INDEPENDENT_AMBULATORY_CARE_PROVIDER_SITE_OTHER): Payer: Self-pay | Admitting: Family Medicine

## 2019-08-24 ENCOUNTER — Other Ambulatory Visit: Payer: Self-pay

## 2019-08-24 DIAGNOSIS — I10 Essential (primary) hypertension: Secondary | ICD-10-CM

## 2019-08-24 MED ORDER — POTASSIUM CHLORIDE ER 10 MEQ PO TBCR: 10.0000 meq | EXTENDED_RELEASE_TABLET | Freq: Every day | ORAL | 1 refills | Status: DC

## 2019-08-24 NOTE — Telephone Encounter (Signed)
Last fill 10/09/2018  #90/2 Last OV 07/30/19

## 2019-09-24 ENCOUNTER — Other Ambulatory Visit: Payer: Self-pay | Admitting: Family Medicine

## 2019-09-24 ENCOUNTER — Telehealth: Payer: Self-pay | Admitting: Family Medicine

## 2019-09-24 DIAGNOSIS — I10 Essential (primary) hypertension: Secondary | ICD-10-CM

## 2019-09-24 MED ORDER — AMLODIPINE BESYLATE 5 MG PO TABS: ORAL_TABLET | ORAL | 0 refills | Status: DC

## 2019-09-24 NOTE — Telephone Encounter (Signed)
Please call patient to schedule TOC.

## 2019-09-24 NOTE — Telephone Encounter (Signed)
Patient is calling for a new pcp to replace Dr. Juleen China. He was advised that Dr. Ferdinand Lango is taking Dr. Juleen China place. Please advise [facility]- [phone]

## 2019-09-24 NOTE — Telephone Encounter (Signed)
Requested medication (s) are due for refill today: yes  Requested medication (s) are on the active medication list: yes  Last refill:  09/2018  Future visit scheduled: yes  Notes to clinic:  Patient has appointment coming up with Dr. Jerline Pain   Requested Prescriptions  Pending Prescriptions Disp Refills   amLODipine (NORVASC) 5 MG tablet 90 tablet 2    Sig: TAKE 1 TABLET DAILY (NEED FOLLOW-UP FOR FURTHER REFILLS)     Cardiovascular:  Calcium Channel Blockers Passed - 09/24/2019 12:07 PM      Passed - Last BP in normal range    BP Readings from Last 1 Encounters:  07/30/19 126/76         Passed - Valid encounter within last 6 months    Recent Outpatient Visits          1 month ago Routine physical examination   [place], [place], DO   2 months ago Cough   [place], Ebony Hail, MD   9 months ago Insulin resistance   [facility] Wallace, [place], [place]   11 months ago Routine physical examination   [place] Wallace, [place], DO   1 year ago Cough   [facility], [place], DO      Future Appointments            In 1 month Jerline Pain, Algis Greenhouse, MD [place], [facility]

## 2019-09-24 NOTE — Telephone Encounter (Signed)
See note

## 2019-09-24 NOTE — Telephone Encounter (Signed)
Medication Refill - Medication:  amLODipine (NORVASC) 5 MG tablet  Has the patient contacted their pharmacy? Yes advised to call office and have another PCP fill script.   Preferred Pharmacy (with phone number or street name):  [id number] - [place], [place] - [name] [phone] (Phone) [phone] (Fax)   Agent: Please be advised that RX refills may take up to 3 business days. We ask that you follow-up with your pharmacy.

## 2019-09-28 DIAGNOSIS — D721 Eosinophilia, unspecified: Secondary | ICD-10-CM | POA: Insufficient documentation

## 2019-10-01 ENCOUNTER — Other Ambulatory Visit: Payer: Self-pay

## 2019-10-01 ENCOUNTER — Ambulatory Visit: Payer: Medicare Other | Admitting: Internal Medicine

## 2019-10-01 ENCOUNTER — Ambulatory Visit
Admission: RE | Admit: 2019-10-01 | Discharge: 2019-10-01 | Disposition: A | Discharge status: Home / Self Care | Payer: Medicare Other | Source: Ambulatory Visit | Attending: Internal Medicine | Admitting: Internal Medicine

## 2019-10-01 ENCOUNTER — Encounter: Payer: Self-pay | Admitting: Internal Medicine

## 2019-10-01 DIAGNOSIS — D7219 Other eosinophilia: Secondary | ICD-10-CM

## 2019-10-01 NOTE — Progress Notes (Signed)
[facility] for Infectious Disease  Reason for Consult: Eosinophilia Referring Provider: Dr. Briscoe Deutscher  Assessment: He has fairly low level, chronic eosinophilia of unknown cause.  He seems to be at relatively low risk for parasitic and fungal causes of eosinophilia.  He has no known atopy and he is not on any drugs that are likely causes.  There is nothing by history or exam that strongly suggest malignancy or vasculitis.  Plan: 1. Strongyloides, Trichinella, Toxocara, Schistosoma and HIV antibodies 2. Stool ova and parasite exam 3. Quantitative immunoglobulins 4. Pathologist review of peripheral blood smear 5. Chest x-ray 6. Follow-up here in 2 weeks  Patient Active Problem List   Diagnosis Date Noted  . Eosinophilia 09/28/2019    Priority: High  . Insulin resistance 12/10/2018  . Morbid obesity ([place]) 12/10/2018  . Rosacea 10/10/2018  . Chronic otitis media after insertion of tympanic ventilation tube, right 07/13/2018  . Dyslipidemia (high LDL; low HDL) 10/07/2017  . Nocturia 01/27/2017  . Abnormal LFTs (liver function tests), monitoring, likely due to fatty liver, may need [name] 01/27/2017  . OSA on CPAP 01/22/2017  . HTN, goal below 130/80 01/20/2017  . Episode of anxiety, prn Xanax, uses sparingly 01/20/2017  . Gastroesophageal reflux disease without esophagitis, on Nexium 01/20/2017  . Nicotine dependence 01/20/2017  . Mixed conductive and sensorineural hearing loss of right ear with restricted hearing of left ear 12/30/2016    Patient's Medications  New Prescriptions   No medications on file  Previous Medications   AMLODIPINE (NORVASC) 5 MG TABLET    TAKE 1 TABLET DAILY (NEED FOLLOW-UP FOR FURTHER REFILLS)   ATORVASTATIN (LIPITOR) 20 MG TABLET    TAKE 1 TABLET BY MOUTH EVERY DAY   ESOMEPRAZOLE (NEXIUM) 40 MG CAPSULE    TAKE 1 CAPSULE DAILY (NEED FOLLOW-UP FOR FURTHER REFILLS)   METFORMIN (GLUCOPHAGE XR) 750 MG 24 HR TABLET    Take 1 tablet (750 mg  total) by mouth daily with breakfast.   OLMESARTAN-HYDROCHLOROTHIAZIDE (BENICAR HCT) 40-25 MG TABLET    TAKE 1 TABLET DAILY   POTASSIUM CHLORIDE (K-DUR) 10 MEQ TABLET    Take 1 tablet (10 mEq total) by mouth daily.   TRAZODONE (DESYREL) 50 MG TABLET    Take 0.5-1 tablets (25-50 mg total) by mouth at bedtime as needed for sleep.  Modified Medications   No medications on file  Discontinued Medications   No medications on file    HPI: Roy Avila is a 68 y.o. male with chronic eosinophilia found incidentally on CBC.  He has had low level eosinophilia at least since November 2018.  His absolute eosinophil count at that time was 1100.  It rose to 1300 and in December of last year and was back down to 1100 this past August.  His total white blood cell count has been slightly elevated but his hemoglobin and platelets are normal.  He has no history of asthma, eczema, urticaria or other hypersensitivity reactions.  He has traveled fairly extensively but has not spent time in the developing world.  Most of his travel has been to large cities with nice accommodations.  He has never lived in [place] over the [place].  He has hypertension, GERD, sleep apnea and dyslipidemia.  He does smoke cigarettes but says that he is only a few each week.  He says that he will smoke a cigarette when he has an alcoholic drink.  Review of Systems: Review of Systems  Constitutional: Negative  for chills, diaphoresis, fever, malaise/fatigue and weight loss.  HENT: Negative for congestion and sore throat.   Respiratory: Negative for cough, sputum production and shortness of breath.   Cardiovascular: Negative for chest pain.  Gastrointestinal: Positive for heartburn. Negative for abdominal pain, diarrhea, nausea and vomiting.  Genitourinary: Negative for dysuria and frequency.  Musculoskeletal: Negative for joint pain and myalgias.  Skin: Negative for rash.  Neurological: Negative for dizziness and headaches.   Psychiatric/Behavioral: Negative for depression and substance abuse. The patient is nervous/anxious.       Past Medical History:  Diagnosis Date  . Anxiety   . GERD (gastroesophageal reflux disease)   . Hyperlipidemia   . Hypertension   . Obstructive sleep apnea syndrome 01/22/2017  . Prediabetes   . Sleep apnea     Social History   Tobacco Use  . Smoking status: Current Some Day Smoker    Years: 30.00    Types: Cigarettes  . Smokeless tobacco: Never Used  . Tobacco comment: Social smoker, 1 pack per week  Substance Use Topics  . Alcohol use: Yes    Comment: socially  . Drug use: No    Family History  Problem Relation Age of Onset  . Breast cancer Mother   . Hypertension Mother   . Obesity Mother   . Heart disease Father   . Hypertension Father   . Hyperlipidemia Father   . Sudden death Father    Allergies  Allergen Reactions  . Clarithromycin Rash    OBJECTIVE: Vitals:   10/01/19 1405  BP: (!) 152/76  Pulse: 71  Temp: 97.7 F (36.5 C)  Weight: 260 lb (117.9 kg)   Body mass index is 36.26 kg/m.   Physical Exam Constitutional:      Comments: He is very pleasant.  Eyes:     Conjunctiva/sclera: Conjunctivae normal.  Cardiovascular:     Rate and Rhythm: Normal rate and regular rhythm.     Heart sounds: No murmur.  Pulmonary:     Effort: Pulmonary effort is normal.     Breath sounds: Normal breath sounds.  Abdominal:     Palpations: Abdomen is soft. There is no mass.     Tenderness: There is no abdominal tenderness.  Musculoskeletal:        General: No swelling or tenderness.  Lymphadenopathy:     Head:     Right side of head: No submandibular adenopathy.     Left side of head: No submandibular adenopathy.     Cervical: No cervical adenopathy.     Upper Body:     Right upper body: No supraclavicular, axillary or epitrochlear adenopathy.     Left upper body: No supraclavicular, axillary or epitrochlear adenopathy.  Skin:    Findings: No  rash.  Neurological:     General: No focal deficit present.  Psychiatric:        Mood and Affect: Mood normal.     Microbiology: No results found for this or any previous visit (from the past 240 hour(s)).  Cliffton Asters, MD [facility] [phone] pager   [phone] cell 10/01/2019, 2:31 PM

## 2019-10-02 LAB — IGG, IGA, IGM
IgG (Immunoglobin G), Serum: 1206 mg/dL (ref 600–1540)
IgM, Serum: 110 mg/dL (ref 50–300)
Immunoglobulin A: 296 mg/dL (ref 70–320)

## 2019-10-02 LAB — HIV ANTIBODY (ROUTINE TESTING W REFLEX): HIV 1&2 Ab, 4th Generation: NONREACTIVE

## 2019-10-02 LAB — IGE: IgE (Immunoglobulin E), Serum: 933 kU/L — ABNORMAL HIGH (ref <=114)

## 2019-10-04 ENCOUNTER — Other Ambulatory Visit: Payer: Medicare Other

## 2019-10-04 ENCOUNTER — Other Ambulatory Visit: Payer: Self-pay

## 2019-10-04 DIAGNOSIS — D7219 Other eosinophilia: Secondary | ICD-10-CM

## 2019-10-05 LAB — PATHOLOGIST SMEAR REVIEW

## 2019-10-12 ENCOUNTER — Other Ambulatory Visit: Payer: Self-pay

## 2019-10-12 ENCOUNTER — Other Ambulatory Visit: Payer: Medicare Other

## 2019-10-12 DIAGNOSIS — D7219 Other eosinophilia: Secondary | ICD-10-CM

## 2019-10-17 LAB — OVA AND PARASITE EXAMINATION
CONCENTRATE RESULT:: NONE SEEN
MICRO NUMBER:: 1099400
SPECIMEN QUALITY:: ADEQUATE
TRICHROME RESULT:: NONE SEEN

## 2019-11-06 ENCOUNTER — Ambulatory Visit (INDEPENDENT_AMBULATORY_CARE_PROVIDER_SITE_OTHER): Payer: Medicare Other | Admitting: Family Medicine

## 2019-11-06 ENCOUNTER — Encounter: Payer: Self-pay | Admitting: Family Medicine

## 2019-11-06 DIAGNOSIS — K219 Gastro-esophageal reflux disease without esophagitis: Secondary | ICD-10-CM | POA: Diagnosis not present

## 2019-11-06 DIAGNOSIS — E7849 Other hyperlipidemia: Secondary | ICD-10-CM | POA: Diagnosis not present

## 2019-11-06 DIAGNOSIS — E8881 Metabolic syndrome: Secondary | ICD-10-CM

## 2019-11-06 DIAGNOSIS — I10 Essential (primary) hypertension: Secondary | ICD-10-CM

## 2019-11-06 DIAGNOSIS — E88819 Insulin resistance, unspecified: Secondary | ICD-10-CM

## 2019-11-06 MED ORDER — ATORVASTATIN CALCIUM 20 MG PO TABS: 20.0000 mg | ORAL_TABLET | Freq: Every day | ORAL | 3 refills | Status: DC

## 2019-11-06 MED ORDER — AMLODIPINE BESYLATE 5 MG PO TABS: ORAL_TABLET | ORAL | 3 refills | Status: DC

## 2019-11-06 NOTE — Assessment & Plan Note (Signed)
Stable. Continue nexium 40mg daily.  

## 2019-11-06 NOTE — Assessment & Plan Note (Signed)
Stable Continue norvasc 5mg  daily and olmesartan-HCTZ 40-25mg  once daily.

## 2019-11-06 NOTE — Assessment & Plan Note (Signed)
Stable. Continue metformin 750mg  daily. Recheck A1c with next set of labs.

## 2019-11-06 NOTE — Progress Notes (Signed)
    Chief Complaint:  Roy Avila is a 68 y.o. male who presents today for a virtual office visit with a chief complaint of hypertension and to transfer care. .   Assessment/Plan:  Insulin resistance Stable. Continue metformin 750mg  daily. Recheck A1c with next set of labs.   GERD Stable. Continue nexium 40mg  daily.   Essential hypertension Stable Continue norvasc 5mg  daily and olmesartan-HCTZ 40-25mg  once daily.   Preventative Healthcare Advised him to follow up in 6-9 months for CPE.     Subjective:  HPI:  His stable, chronic medical conditions are outlined below:   # Essential Hypertension -On Norvasc 5 mg daily and olmesartan-HCTZ 40-25 mg daily. Tolerating well - ROS: No reported chest pain or shortness of breath   # Prediabetes -On metformin 750 mg daily and tolerating well - ROS: No polyuria or polydipsia  #GERD - On Nexium 40 mg daily and tolerating well - No reported unintentional weightloss or early satiety   % OSA on CPAP  ROS: Per HPI  PMH: He reports that he has been smoking cigarettes. He has smoked for the past 30.00 years. He has never used smokeless tobacco. He reports current alcohol use. He reports that he does not use drugs.      Objective/Observations  Physical Exam: Gen: NAD, resting comfortably Pulm: Normal work of breathing Neuro: Grossly normal, moves all extremities Psych: Normal affect and thought content  No results found for this or any previous visit (from the past 24 hour(s)).   Virtual Visit via Video   I connected with Roy Avila on 11/06/19 at  1:20 PM EST by a video enabled telemedicine application and verified that I am speaking with the correct person using two identifiers. I discussed the limitations of evaluation and management by telemedicine and the availability of in person appointments. The patient expressed understanding and agreed to proceed.   Patient location: Home Provider location: [place] participating in the virtual visit: Myself and Patient     Algis Greenhouse. Jerline Pain, MD 11/06/2019 2:02 PM

## 2019-12-09 ENCOUNTER — Other Ambulatory Visit: Payer: Self-pay | Admitting: Family Medicine

## 2019-12-09 DIAGNOSIS — E88819 Insulin resistance, unspecified: Secondary | ICD-10-CM

## 2019-12-09 DIAGNOSIS — E8881 Metabolic syndrome: Secondary | ICD-10-CM

## 2019-12-13 NOTE — Telephone Encounter (Signed)
Last OV 11/06/19 Last refill 05/15/19 #90/0 Next OV not scheduled

## 2019-12-14 ENCOUNTER — Telehealth: Payer: Self-pay | Admitting: Family Medicine

## 2019-12-14 NOTE — Telephone Encounter (Signed)
Patient calls in asking to get the covid antibody test to see if he had because he was exposed to someone back in September. Offered patient an appointment but he states he would just like a phone call from the nurse to speak about the antibody teast to see if it is accurate

## 2019-12-18 ENCOUNTER — Encounter: Payer: Self-pay | Admitting: Family Medicine

## 2019-12-18 ENCOUNTER — Other Ambulatory Visit: Payer: Self-pay

## 2019-12-18 ENCOUNTER — Ambulatory Visit (INDEPENDENT_AMBULATORY_CARE_PROVIDER_SITE_OTHER): Payer: Medicare Other | Admitting: Family Medicine

## 2019-12-18 VITALS — BP 133/77 | HR 72 | Temp 97.0°F | Ht 71.0 in | Wt 258.2 lb

## 2019-12-18 DIAGNOSIS — Z20822 Contact with and (suspected) exposure to covid-19: Secondary | ICD-10-CM | POA: Diagnosis not present

## 2019-12-18 DIAGNOSIS — E88819 Insulin resistance, unspecified: Secondary | ICD-10-CM

## 2019-12-18 DIAGNOSIS — I10 Essential (primary) hypertension: Secondary | ICD-10-CM

## 2019-12-18 DIAGNOSIS — E8881 Metabolic syndrome: Secondary | ICD-10-CM

## 2019-12-18 LAB — SARS-COV-2 IGG: SARS-COV-2 IgG: 0.02

## 2019-12-18 LAB — HEMOGLOBIN A1C: Hgb A1c MFr Bld: 6.6 % — ABNORMAL HIGH (ref 4.6–6.5)

## 2019-12-18 NOTE — Telephone Encounter (Signed)
Appt scheduled

## 2019-12-18 NOTE — Patient Instructions (Addendum)
It was very nice to see you today!  Your blood pressure looks good today.  We will check your antibody test.  Please come back in September for your annual checkup, or sooner if needed.  Take care, Dr Jimmey Ralph  Please try these tips to maintain a healthy lifestyle:   Eat at least 3 REAL meals and 1-2 snacks per day.  Aim for no more than 5 hours between eating.  If you eat breakfast, please do so within one hour of getting up.    Each meal should contain half fruits/vegetables, one quarter protein, and one quarter carbs (no bigger than a computer mouse)   Cut down on sweet beverages. This includes juice, soda, and sweet tea.     Drink at least 1 glass of water with each meal and aim for at least 8 glasses per day   Exercise at least 150 minutes every week.

## 2019-12-18 NOTE — Progress Notes (Signed)
   Roy Avila is a 69 y.o. male who presents today for an office visit.  Assessment/Plan:  New/Acute Problems: COVID Exposure  Check Covid antibody today.  Chronic Problems Addressed Today: Insulin resistance Check A1c today.  Continue Metformin 750 mg once daily.  Essential hypertension At goal.  Continue Norvasc 5 mg daily, olmesartan 40 mg daily, and HCTZ 25 mg daily.     Subjective:  HPI:  Patient would like to have Covid antibody test performed today.  He states that he was exposed to Covid in late September.  He received a haircut and then later went on vacation with his family.  While he was on vacation his hairdresser informed him that she had been diagnosed with Covid.  He never developed any symptoms however his daughter developed symptoms consistent with Covid.  He would like antibody testing today to see if he had an asymptomatic case.    He is otherwise doing well.  Tolerating his medications well without side effects.         Objective:  Physical Exam: BP 133/77   Pulse 72   Temp (!) 97 F (36.1 C)   Ht 5\' 11"  (1.803 m)   Wt 258 lb 4 oz (117.1 kg)   SpO2 96%   BMI 36.02 kg/m   Gen: No acute distress, resting comfortably CV: Regular rate and rhythm with no murmurs appreciated Pulm: Normal work of breathing, clear to auscultation bilaterally with no crackles, wheezes, or rhonchi Neuro: Grossly normal, moves all extremities Psych: Normal affect and thought content      Ayahna Solazzo M. , MD 12/18/2019 11:07 AM

## 2019-12-18 NOTE — Assessment & Plan Note (Signed)
At goal.  Continue Norvasc 5 mg daily, olmesartan 40 mg daily, and HCTZ 25 mg daily.

## 2019-12-18 NOTE — Assessment & Plan Note (Signed)
Check A1c today.  Continue Metformin 750 mg once daily.

## 2019-12-19 NOTE — Progress Notes (Signed)
Please inform patient of the following:  Covid antibody negative. Blood sugar elevated but stable. We can recheck again in 6 months.  Roy Avila. Jimmey Ralph, MD 12/19/2019 8:32 AM

## 2019-12-20 ENCOUNTER — Ambulatory Visit: Payer: Medicare Other | Attending: Internal Medicine

## 2019-12-20 DIAGNOSIS — Z23 Encounter for immunization: Secondary | ICD-10-CM | POA: Insufficient documentation

## 2019-12-20 NOTE — Progress Notes (Signed)
   Covid-19 Vaccination Clinic  Name:  Zayvon Alicea    MRN: [id number] DOB: Feb 28, 1951  12/20/2019  Mr. Caba was observed post Covid-19 immunization for 15 minutes without incidence. He was provided with Vaccine Information Sheet and instruction to access the V-Safe system.   Mr. Dunavant was instructed to call 911 with any severe reactions post vaccine: Marland Kitchen Difficulty breathing  . Swelling of your face and throat  . A fast heartbeat  . A bad rash all over your body  . Dizziness and weakness    Immunizations Administered    Name Date Dose VIS Date Route   Pfizer COVID-19 Vaccine 12/20/2019  3:14 PM 0.3 mL 11/09/2019 Intramuscular   Manufacturer: [employer], [employer]   Lot: [id number]   NDC: [address]-5

## 2019-12-24 ENCOUNTER — Other Ambulatory Visit: Payer: Self-pay

## 2019-12-24 DIAGNOSIS — E8881 Metabolic syndrome: Secondary | ICD-10-CM

## 2019-12-24 MED ORDER — METFORMIN HCL ER 750 MG PO TB24: 750.0000 mg | ORAL_TABLET | Freq: Every day | ORAL | 0 refills | Status: DC

## 2020-01-10 ENCOUNTER — Ambulatory Visit: Payer: Medicare Other | Attending: Internal Medicine

## 2020-01-10 DIAGNOSIS — Z23 Encounter for immunization: Secondary | ICD-10-CM | POA: Insufficient documentation

## 2020-01-10 NOTE — Progress Notes (Signed)
   Covid-19 Vaccination Clinic  Name:  Roy Avila    MRN: [id number] DOB: 1951-11-23  01/10/2020  [name] was observed post Covid-19 immunization for 15 minutes without incidence. He was provided with Vaccine Information Sheet and instruction to access the V-Safe system.   [name] was instructed to call 911 with any severe reactions post vaccine: Marland Kitchen Difficulty breathing  . Swelling of your face and throat  . A fast heartbeat  . A bad rash all over your body  . Dizziness and weakness    Immunizations Administered    Name Date Dose VIS Date Route   Pfizer COVID-19 Vaccine 01/10/2020  4:20 PM 0.3 mL 11/09/2019 Intramuscular   Manufacturer: [employer], [employer]   Lot: [id number]   NDC: [address]-0

## 2020-01-24 IMAGING — DX DG CHEST 2V
2 series · 2 of 2 positions shown · non-contrast
Comparison: 04/11/2018

CLINICAL DATA: Eosinophilic leukocytosis.

EXAM:
CHEST - 2 VIEW

[dg chest 2 view (1 of 2)]
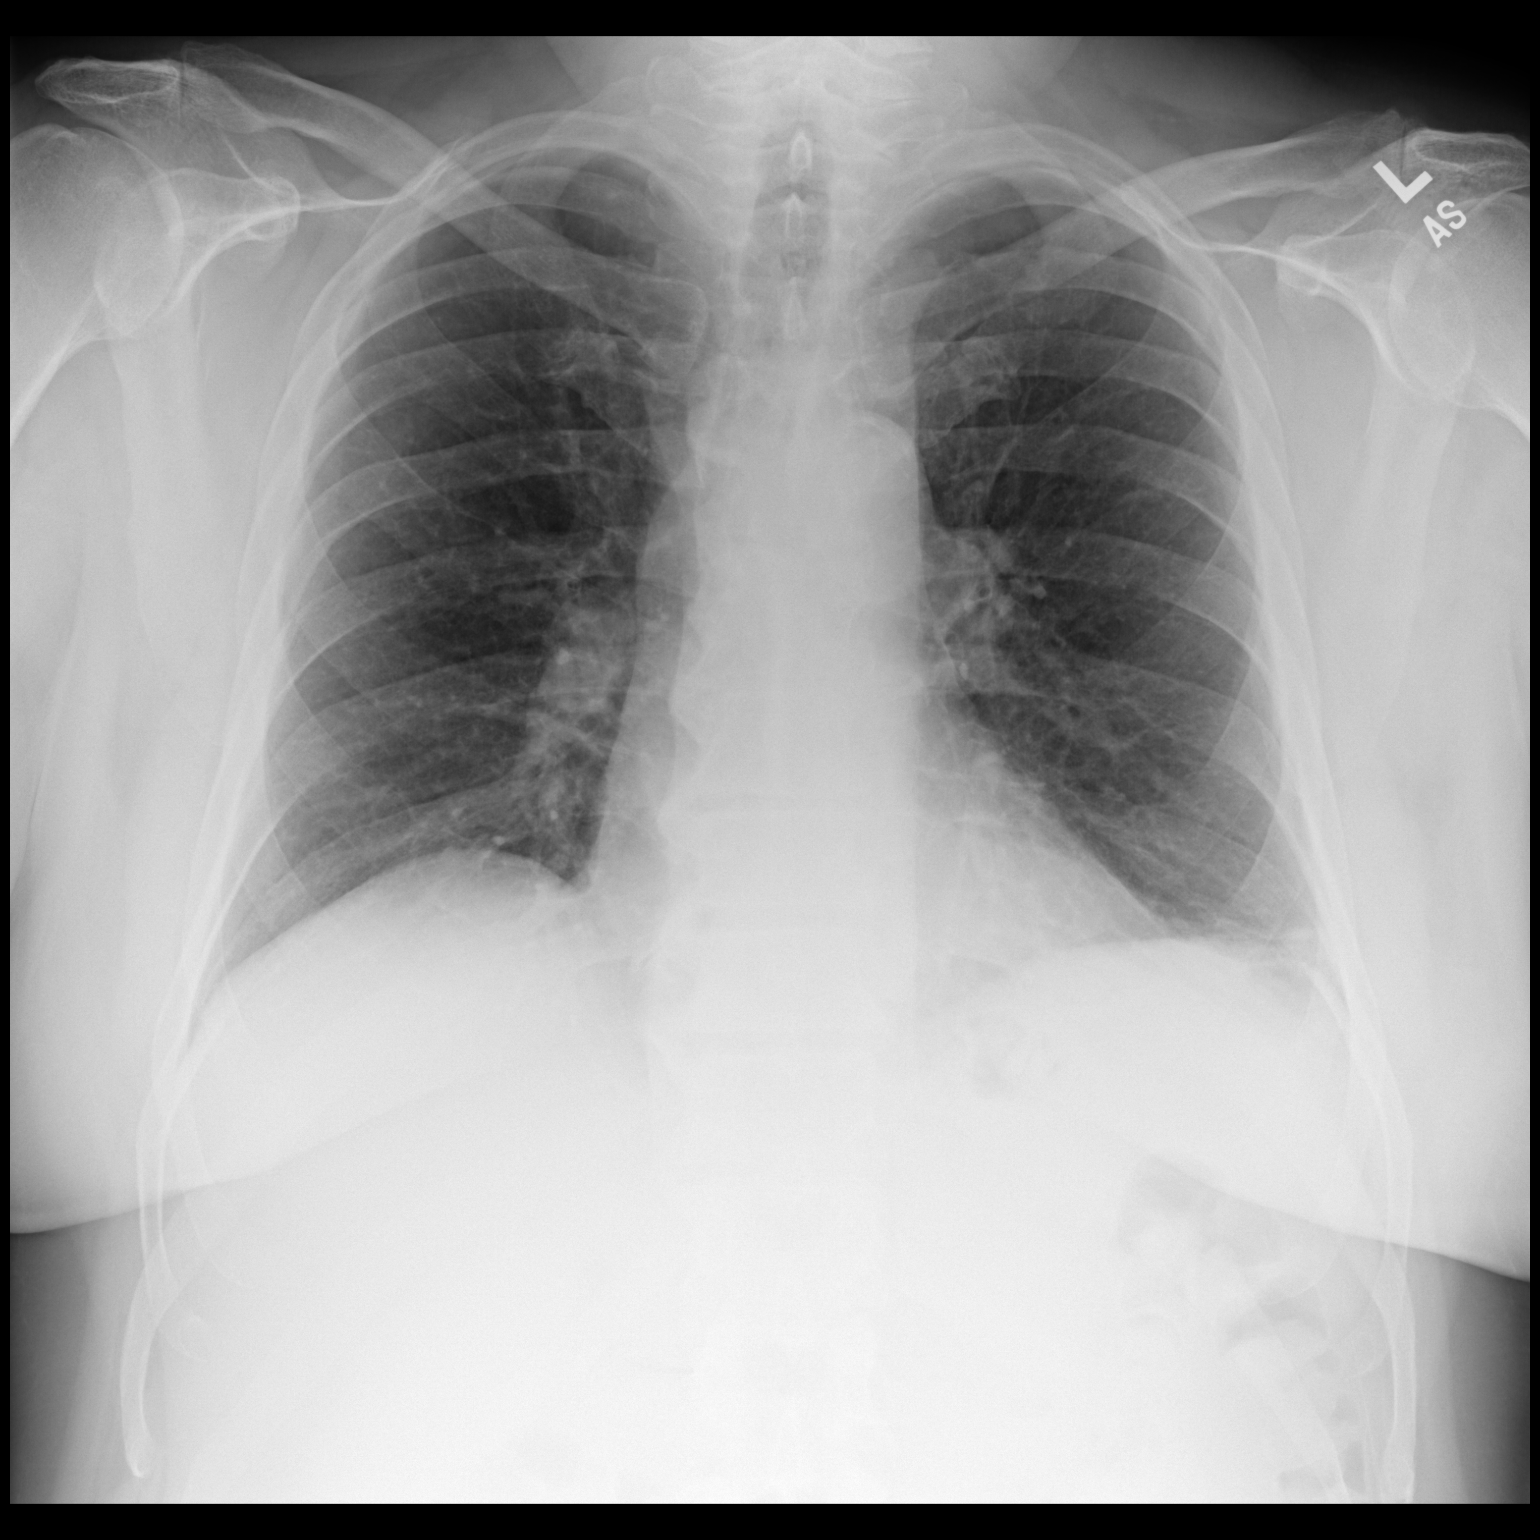

[dg chest 2 view (2 of 2)]
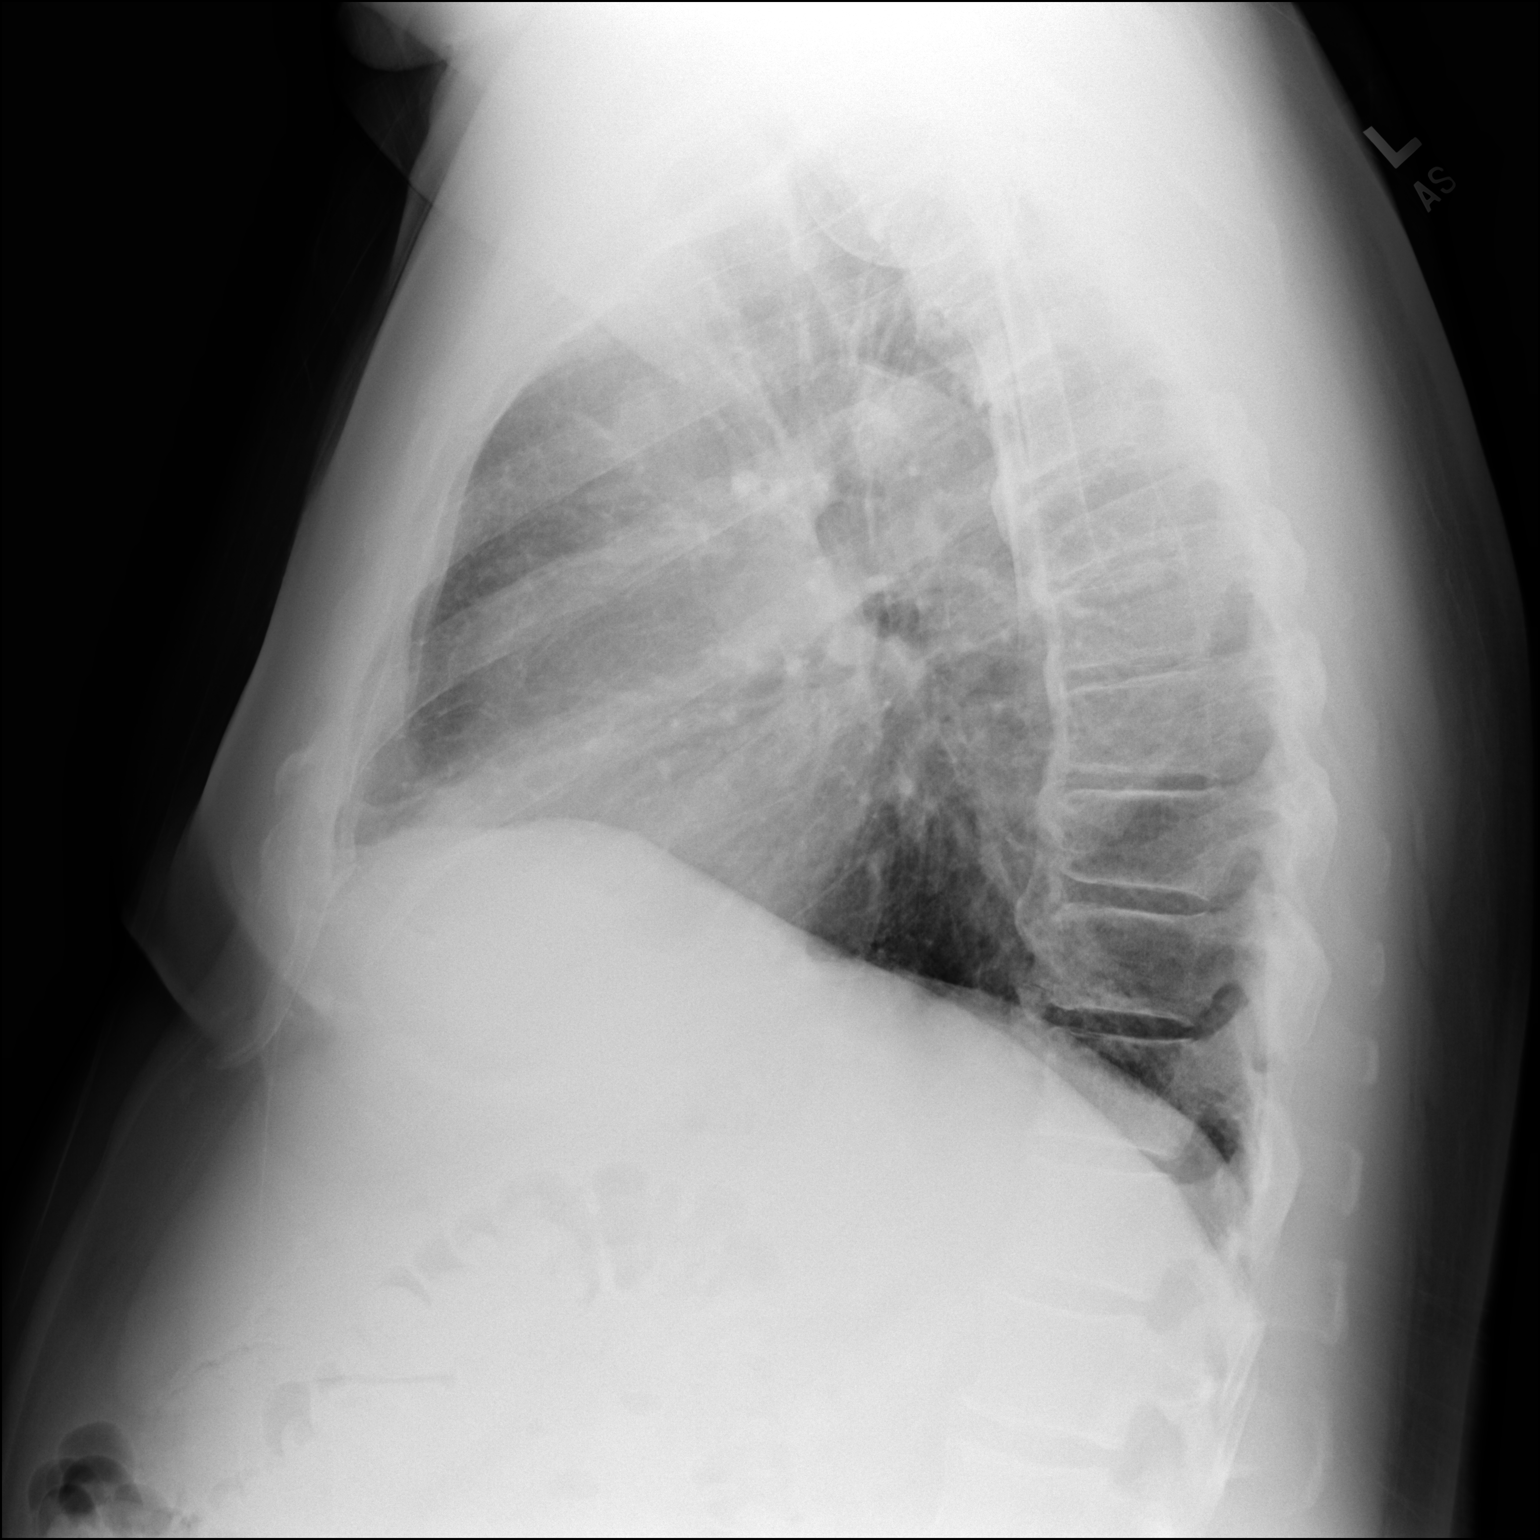

[2 of 2 positions shown; findings below may reference images not displayed]

FINDINGS: Lungs are adequately inflated without consolidation or effusion.
Subtle linear atelectasis/scarring lateral left base.
Cardiomediastinal silhouette and remainder of the exam is unchanged.
IMPRESSION: No active cardiopulmonary disease.

## 2020-02-11 ENCOUNTER — Telehealth: Payer: Self-pay | Admitting: *Deleted

## 2020-02-11 ENCOUNTER — Other Ambulatory Visit: Payer: Self-pay

## 2020-02-11 MED ORDER — ESOMEPRAZOLE MAGNESIUM 40 MG PO CPDR: DELAYED_RELEASE_CAPSULE | ORAL | 4 refills | Status: DC

## 2020-02-11 NOTE — Telephone Encounter (Signed)
Ok with me. Please place any necessary orders. 

## 2020-02-11 NOTE — Telephone Encounter (Signed)
LAST APPOINTMENT DATE: 12/18/2019 NEXT APPOINTMENT DATE:Visit date not found  Rx Esomeprazole 40mg   LAST REFILL: 12/26/2018 QTY: #90 4 Rf  Last refill done by Dr 12/28/2018

## 2020-02-15 ENCOUNTER — Other Ambulatory Visit: Payer: Self-pay

## 2020-02-15 ENCOUNTER — Ambulatory Visit: Payer: Medicare Other

## 2020-03-11 ENCOUNTER — Telehealth (INDEPENDENT_AMBULATORY_CARE_PROVIDER_SITE_OTHER): Payer: Medicare Other | Admitting: Family Medicine

## 2020-03-11 ENCOUNTER — Encounter: Payer: Self-pay | Admitting: Family Medicine

## 2020-03-11 DIAGNOSIS — R05 Cough: Secondary | ICD-10-CM

## 2020-03-11 DIAGNOSIS — R059 Cough, unspecified: Secondary | ICD-10-CM

## 2020-03-11 MED ORDER — BENZONATATE 200 MG PO CAPS: 200.0000 mg | ORAL_CAPSULE | Freq: Two times a day (BID) | ORAL | 0 refills | Status: DC | PRN

## 2020-03-11 MED ORDER — DOXYCYCLINE HYCLATE 100 MG PO TABS: 100.0000 mg | ORAL_TABLET | Freq: Two times a day (BID) | ORAL | 0 refills | Status: DC

## 2020-03-11 NOTE — Progress Notes (Signed)
   Roy Avila is a 69 y.o. male who presents today for a virtual office visit.  Assessment/Plan:  Cough No red flags.  Likely secondary to seasonal allergies.  Will treat symptomatically with Tessalon.  Also send in "pocket prescription" with strict instruction not start unless symptoms worsen or not improve the next several days.    Subjective:  HPI:  Started a few days ago with a cough. No rhinorrhea. No fevers or chills. Cough is not productive. Has been exposed to more pollen. Has had no issues with allergies in the past. Symptoms seem to be about the same. No shortness of breath or chest pain.        Objective/Observations  Physical Exam: Gen: NAD, resting comfortably Pulm: Normal work of breathing Neuro: Grossly normal, moves all extremities Psych: Normal affect and thought content  Virtual Visit via Video   I connected with Roy Avila on 03/11/20 at 11:40 AM EDT by a video enabled telemedicine application and verified that I am speaking with the correct person using two identifiers. The limitations of evaluation and management by telemedicine and the availability of in person appointments were discussed. The patient expressed understanding and agreed to proceed.   Patient location: Home Provider location: [place] [employer] Persons participating in the virtual visit: Myself and Patient     Katina Degree. Jimmey Ralph, MD 03/11/2020 11:31 AM

## 2020-05-29 ENCOUNTER — Other Ambulatory Visit: Payer: Self-pay | Admitting: Family Medicine

## 2020-05-29 DIAGNOSIS — E8881 Metabolic syndrome: Secondary | ICD-10-CM

## 2020-06-03 ENCOUNTER — Other Ambulatory Visit: Payer: Self-pay

## 2020-06-03 MED ORDER — ESOMEPRAZOLE MAGNESIUM 40 MG PO CPDR: DELAYED_RELEASE_CAPSULE | ORAL | 1 refills | Status: DC

## 2020-06-16 ENCOUNTER — Ambulatory Visit: Payer: Medicare Other | Admitting: Family Medicine

## 2020-08-29 ENCOUNTER — Other Ambulatory Visit (INDEPENDENT_AMBULATORY_CARE_PROVIDER_SITE_OTHER): Payer: Self-pay | Admitting: Family Medicine

## 2020-08-29 DIAGNOSIS — I10 Essential (primary) hypertension: Secondary | ICD-10-CM

## 2020-09-15 ENCOUNTER — Other Ambulatory Visit: Payer: Self-pay | Admitting: *Deleted

## 2020-09-15 DIAGNOSIS — I10 Essential (primary) hypertension: Secondary | ICD-10-CM

## 2020-09-15 MED ORDER — OLMESARTAN MEDOXOMIL-HCTZ 40-25 MG PO TABS: 1.0000 | ORAL_TABLET | Freq: Every day | ORAL | 3 refills | Status: DC

## 2020-09-15 MED ORDER — ESOMEPRAZOLE MAGNESIUM 40 MG PO CPDR: DELAYED_RELEASE_CAPSULE | ORAL | 1 refills | Status: DC

## 2020-09-15 NOTE — Telephone Encounter (Signed)
LAST APPOINTMENT DATE: 03/11/2020   NEXT APPOINTMENT DATE: Visit date not found   Rx Olmesartan/HCTZ 40/25mg   LAST REFILL: 08/24/2019  QTY: 90 Ref:3  Rx Xanax LAST REFILL: 10/05/2018 QTY: 20

## 2020-09-15 NOTE — Telephone Encounter (Signed)
Patient is almost out of the medication, asked for a refill as soon as possible

## 2021-01-02 ENCOUNTER — Encounter: Payer: Self-pay | Admitting: Emergency Medicine

## 2021-01-02 ENCOUNTER — Other Ambulatory Visit: Payer: Self-pay | Admitting: Family Medicine

## 2021-01-02 DIAGNOSIS — E7849 Other hyperlipidemia: Secondary | ICD-10-CM

## 2021-01-02 DIAGNOSIS — I10 Essential (primary) hypertension: Secondary | ICD-10-CM

## 2021-01-07 ENCOUNTER — Telehealth: Payer: Self-pay

## 2021-01-07 NOTE — Telephone Encounter (Signed)
Patient is requesting to have blood drawn that [place] dermatologist that dr.gray requested HEPATIC function panel, lipid panel can we put orders in for these and patient would also like to get yearly blood work at the same time

## 2021-01-07 NOTE — Telephone Encounter (Signed)
See below

## 2021-01-08 NOTE — Telephone Encounter (Signed)
He has not been here for a year. He needs to schedule his annual visit and we can do labs at that time.  Roy Avila. Roy Ralph, MD 01/08/2021 8:35 AM

## 2021-01-08 NOTE — Telephone Encounter (Signed)
Patient aware, transfer to front office for appointment

## 2021-01-12 ENCOUNTER — Other Ambulatory Visit: Payer: Self-pay | Admitting: *Deleted

## 2021-01-12 ENCOUNTER — Encounter: Payer: Medicare Other | Admitting: Family Medicine

## 2021-01-12 DIAGNOSIS — I10 Essential (primary) hypertension: Secondary | ICD-10-CM

## 2021-01-12 MED ORDER — OLMESARTAN MEDOXOMIL-HCTZ 40-25 MG PO TABS: 1.0000 | ORAL_TABLET | Freq: Every day | ORAL | 3 refills | Status: DC

## 2021-01-15 ENCOUNTER — Other Ambulatory Visit: Payer: Self-pay

## 2021-01-15 ENCOUNTER — Encounter: Payer: Self-pay | Admitting: Family Medicine

## 2021-01-15 ENCOUNTER — Ambulatory Visit (INDEPENDENT_AMBULATORY_CARE_PROVIDER_SITE_OTHER): Payer: Medicare Other | Admitting: Family Medicine

## 2021-01-15 VITALS — BP 134/79 | HR 72 | Temp 98.1°F | Ht 71.0 in | Wt 265.4 lb

## 2021-01-15 DIAGNOSIS — Z23 Encounter for immunization: Secondary | ICD-10-CM | POA: Diagnosis not present

## 2021-01-15 DIAGNOSIS — E1165 Type 2 diabetes mellitus with hyperglycemia: Secondary | ICD-10-CM

## 2021-01-15 DIAGNOSIS — I10 Essential (primary) hypertension: Secondary | ICD-10-CM

## 2021-01-15 DIAGNOSIS — E8881 Metabolic syndrome: Secondary | ICD-10-CM

## 2021-01-15 DIAGNOSIS — R351 Nocturia: Secondary | ICD-10-CM

## 2021-01-15 DIAGNOSIS — Z6837 Body mass index (BMI) 37.0-37.9, adult: Secondary | ICD-10-CM

## 2021-01-15 DIAGNOSIS — L719 Rosacea, unspecified: Secondary | ICD-10-CM

## 2021-01-15 DIAGNOSIS — E785 Hyperlipidemia, unspecified: Secondary | ICD-10-CM

## 2021-01-15 DIAGNOSIS — K219 Gastro-esophageal reflux disease without esophagitis: Secondary | ICD-10-CM

## 2021-01-15 DIAGNOSIS — Z0001 Encounter for general adult medical examination with abnormal findings: Secondary | ICD-10-CM | POA: Diagnosis not present

## 2021-01-15 NOTE — Assessment & Plan Note (Signed)
Check A1c. 

## 2021-01-15 NOTE — Patient Instructions (Signed)
It was very nice to see you today!  We will check blood work tomorrow.  Give you pneumonia vaccine today.  I will place a referral for you to have a cardiac calcium score done at [facility].  I will see back in year for your next annual checkup.  Please come back to see me sooner if needed.  Take care, Dr Jimmey Ralph  Please try these tips to maintain a healthy lifestyle:   Eat at least 3 REAL meals and 1-2 snacks per day.  Aim for no more than 5 hours between eating.  If you eat breakfast, please do so within one hour of getting up.    Each meal should contain half fruits/vegetables, one quarter protein, and one quarter carbs (no bigger than a computer mouse)   Cut down on sweet beverages. This includes juice, soda, and sweet tea.     Drink at least 1 glass of water with each meal and aim for at least 8 glasses per day   Exercise at least 150 minutes every week.    Preventive Care 70 Years and Older, Male Preventive care refers to lifestyle choices and visits with your health care provider that can promote health and wellness. This includes:  A yearly physical exam. This is also called an annual wellness visit.  Regular dental and eye exams.  Immunizations.  Screening for certain conditions.  Healthy lifestyle choices, such as: ? Eating a healthy diet. ? Getting regular exercise. ? Not using drugs or products that contain nicotine and tobacco. ? Limiting alcohol use. What can I expect for my preventive care visit? Physical exam Your health care provider will check your:  Height and weight. These may be used to calculate your BMI (body mass index). BMI is a measurement that tells if you are at a healthy weight.  Heart rate and blood pressure.  Body temperature.  Skin for abnormal spots. Counseling Your health care provider may ask you questions about your:  Past medical problems.  Family's medical history.  Alcohol, tobacco, and drug use.  Emotional  well-being.  Home life and relationship well-being.  Sexual activity.  Diet, exercise, and sleep habits.  History of falls.  Memory and ability to understand (cognition).  Work and work Astronomer.  Access to firearms. What immunizations do I need? Vaccines are usually given at various ages, according to a schedule. Your health care provider will recommend vaccines for you based on your age, medical history, and lifestyle or other factors, such as travel or where you work.   What tests do I need? Blood tests  Lipid and cholesterol levels. These may be checked every 5 years, or more often depending on your overall health.  Hepatitis C test.  Hepatitis B test. Screening  Lung cancer screening. You may have this screening every year starting at age 21 if you have a 30-pack-year history of smoking and currently smoke or have quit within the past 15 years.  Colorectal cancer screening. ? All adults should have this screening starting at age 79 and continuing until age 96. ? Your health care provider may recommend screening at age 74 if you are at increased risk. ? You will have tests every 1-10 years, depending on your results and the type of screening test.  Prostate cancer screening. Recommendations will vary depending on your family history and other risks.  Genital exam to check for testicular cancer or hernias.  Diabetes screening. ? This is done by checking your blood sugar (glucose)  after you have not eaten for a while (fasting). ? You may have this done every 1-3 years.  Abdominal aortic aneurysm (AAA) screening. You may need this if you are a current or former smoker.  STD (sexually transmitted disease) testing, if you are at risk. Follow these instructions at home: Eating and drinking  Eat a diet that includes fresh fruits and vegetables, whole grains, lean protein, and low-fat dairy products. Limit your intake of foods with high amounts of sugar, saturated fats,  and salt.  Take vitamin and mineral supplements as recommended by your health care provider.  Do not drink alcohol if your health care provider tells you not to drink.  If you drink alcohol: ? Limit how much you have to 0-2 drinks a day. ? Be aware of how much alcohol is in your drink. In the [place] one drink equals one 12 oz bottle of beer (355 mL), one 5 oz glass of wine (148 mL), or one 1 oz glass of hard liquor (44 mL).   Lifestyle  Take daily care of your teeth and gums. Brush your teeth every morning and night with fluoride toothpaste. Floss one time each day.  Stay active. Exercise for at least 30 minutes 5 or more days each week.  Do not use any products that contain nicotine or tobacco, such as cigarettes, e-cigarettes, and chewing tobacco. If you need help quitting, ask your health care provider.  Do not use drugs.  If you are sexually active, practice safe sex. Use a condom or other form of protection to prevent STIs (sexually transmitted infections).  Talk with your health care provider about taking a low-dose aspirin or statin.  Find healthy ways to cope with stress, such as: ? Meditation, yoga, or listening to music. ? Journaling. ? Talking to a trusted person. ? Spending time with friends and family. Safety  Always wear your seat belt while driving or riding in a vehicle.  Do not drive: ? If you have been drinking alcohol. Do not ride with someone who has been drinking. ? When you are tired or distracted. ? While texting.  Wear a helmet and other protective equipment during sports activities.  If you have firearms in your house, make sure you follow all gun safety procedures. What's next?  Visit your health care provider once a year for an annual wellness visit.  Ask your health care provider how often you should have your eyes and teeth checked.  Stay up to date on all vaccines. This information is not intended to replace advice given to you by your  health care provider. Make sure you discuss any questions you have with your health care provider. Document Revised: 08/14/2019 Document Reviewed: 11/09/2018 Elsevier Patient Education  2021 [employer].

## 2021-01-15 NOTE — Progress Notes (Signed)
Chief Complaint:  Roy Avila is a 69 y.o. male who presents today for his annual comprehensive physical exam.    Assessment/Plan:  Chronic Problems Addressed Today: Type 2 diabetes mellitus with hyperglycemia, without long-term current use of insulin (HCC) Check A1c. Has not been on Metformin for several months. I need to restart depending on results of A1c. Also place referral to weight management.  Morbid obesity (HCC) Will place referral to weight management. Discussed lifestyle modifications today.  Insulin resistance Check A1c.  Rosacea Follows with dermatology. On isotretinoin. Will check requested labs today including CMET and lipid panel.  Dyslipidemia Check lipids. He is on Lipitor 20 mg daily. Tolerating well.  Nocturia Check PSA.  GERD Stable on Nexium 40 mg daily.  Essential hypertension At goal. Check labs. Continue Norvasc 5 mg daily and olmesartan-HCTZ 40-25 once daily.  Body mass index is 37.02 kg/m. / Obese  BMI Metric Follow Up - 01/15/21 1537      BMI Metric Follow Up-Please document annually   BMI Metric Follow Up Education provided           Preventative Healthcare: Will give Pneumovax today. Due for colonoscopy soon. Check labs today. Up-to-date on other vaccines and screenings.  Patient Counseling(The following topics were reviewed and/or handout was given):  -Nutrition: Stressed importance of moderation in sodium/caffeine intake, saturated fat and cholesterol, caloric balance, sufficient intake of fresh fruits, vegetables, and fiber.  -Stressed the importance of regular exercise.   -Substance Abuse: Discussed cessation/primary prevention of tobacco, alcohol, or other drug use; driving or other dangerous activities under the influence; availability of treatment for abuse.   -Injury prevention: Discussed safety belts, safety helmets, smoke detector, smoking near bedding or upholstery.   -Sexuality: Discussed sexually transmitted diseases,  partner selection, use of condoms, avoidance of unintended pregnancy and contraceptive alternatives.   -Dental health: Discussed importance of regular tooth brushing, flossing, and dental visits.  -Health maintenance and immunizations reviewed. Please refer to Health maintenance section.  Return to care in 1 year for next preventative visit.     Subjective:  HPI:  He has no acute complaints today.   Lifestyle Diet: Balanced.  Exercise: Limited.   Depression screen PHQ 2/9 01/15/2021  Decreased Interest 0  Down, Depressed, Hopeless -  PHQ - 2 Score 0  Altered sleeping -  Tired, decreased energy -  Change in appetite -  Feeling bad or failure about yourself  -  Trouble concentrating -  Moving slowly or fidgety/restless -  Suicidal thoughts -  PHQ-9 Score -  Difficult doing work/chores -    Health Maintenance Due  Topic Date Due  . PNA vac Low Risk Adult (2 of 2 - PPSV23) 10/10/2019  . COVID-19 Vaccine (3 - Booster for Pfizer series) 07/09/2020     ROS: Per HPI, otherwise a complete review of systems was negative.   PMH:  The following were reviewed and entered/updated in epic: Past Medical History:  Diagnosis Date  . Abnormal LFTs (liver function tests), monitoring, likely due to fatty liver, may need [name] 01/27/2017  . Anxiety   . Chronic otitis media after insertion of tympanic ventilation tube, right 07/13/2018  . GERD (gastroesophageal reflux disease)   . Hyperlipidemia   . Hypertension   . Obstructive sleep apnea syndrome 01/22/2017  . Prediabetes   . Sleep apnea    Patient Active Problem List   Diagnosis Date Noted  . Type 2 diabetes mellitus with hyperglycemia, without long-term current use of insulin (HCC) 01/15/2021  .  Morbid obesity (HCC) 12/10/2018  . Rosacea 10/10/2018  . Dyslipidemia 10/07/2017  . Nocturia 01/27/2017  . OSA on CPAP 01/22/2017  . Essential hypertension 01/20/2017  . GERD 01/20/2017  . Nicotine dependence 01/20/2017  . 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 HERNIA REPAIR      Family History  Problem Relation Age of Onset  . Breast cancer Mother   . Hypertension Mother   . Obesity Mother   . Heart disease Father   . Hypertension Father   . Hyperlipidemia Father   . Sudden death Father   . Prostate cancer Neg Hx   . Colon cancer Neg Hx     Medications- reviewed and updated Current Outpatient Medications  Medication Sig Dispense Refill  . amLODipine (NORVASC) 5 MG tablet TAKE 1 TABLET DAILY.     DUE FOR APPOINTMENT 90 tablet 0  . atorvastatin (LIPITOR) 20 MG tablet Take 1 tablet (20 mg total) by mouth daily. DUE FOR APPOINTMENT 90 tablet 0  . esomeprazole (NEXIUM) 40 MG capsule TAKE 1 CAPSULE DAILY 90 capsule 1  . isotretinoin (ACCUTANE) 10 MG capsule Take 30 mg by mouth daily.    Marland Kitchen olmesartan-hydrochlorothiazide (BENICAR HCT) 40-25 MG tablet Take 1 tablet by mouth daily. 90 tablet 3   No current facility-administered medications for this visit.    Allergies-reviewed and updated Allergies  Allergen Reactions  . Clarithromycin Rash    Social History   Socioeconomic History  . Marital status: Married    Spouse name: Not on file  . Number of children: 2  . Years of education: college 4  . Highest education level: Not on file  Occupational History  . Occupation: Retired  . Occupation: Sales  Tobacco Use  . Smoking status: Current Some Day Smoker    Years: 30.00    Types: Cigarettes  . Smokeless tobacco: Never Used  . Tobacco comment: Social smoker, 1 pack per week  Substance and Sexual Activity  . Alcohol use: Yes    Comment: socially  . Drug use: No  . Sexual activity: Yes    Partners: Female    Birth control/protection: None  Other Topics Concern  . Not on file  Social History Narrative  . Not on file   Social Determinants of Health   Financial  Resource Strain: Not on file  Food Insecurity: Not on file  Transportation Needs: Not on file  Physical Activity: Not on file  Stress: Not on file  Social Connections: Not on file        Objective:  Physical Exam: BP 134/79   Pulse 72   Temp 98.1 F (36.7 C) (Temporal)   Ht 5\' 11"  (1.803 m)   Wt 265 lb 6.4 oz (120.4 kg)   SpO2 96%   BMI 37.02 kg/m   Body mass index is 37.02 kg/m. Wt Readings from Last 3 Encounters:  01/15/21 265 lb 6.4 oz (120.4 kg)  12/18/19 258 lb 4 oz (117.1 kg)  10/01/19 260 lb (117.9 kg)   Gen: NAD, resting comfortably HEENT: TMs normal bilaterally. OP clear. No thyromegaly noted.  CV: RRR with no murmurs appreciated Pulm: NWOB, CTAB with no crackles, wheezes, or rhonchi GI: Normal bowel sounds present. Soft, Nontender, Nondistended. MSK: no edema, cyanosis, or clubbing noted Skin: warm, dry Neuro: CN2-12 grossly intact. Strength 5/5 in upper and lower extremities. Reflexes symmetric and intact bilaterally.  Psych: Normal affect and thought content     Jaki Hammerschmidt M. Jimmey Ralph, MD 01/15/2021 3:40 PM

## 2021-01-15 NOTE — Assessment & Plan Note (Signed)
At goal. Check labs. Continue Norvasc 5 mg daily and olmesartan-HCTZ 40-25 once daily.

## 2021-01-15 NOTE — Assessment & Plan Note (Signed)
Stable on Nexium 40 mg daily °

## 2021-01-15 NOTE — Assessment & Plan Note (Signed)
Check PSA. ?

## 2021-01-15 NOTE — Assessment & Plan Note (Signed)
Will place referral to weight management. Discussed lifestyle modifications today.

## 2021-01-15 NOTE — Addendum Note (Signed)
Addended by: Dyann Kief on: 01/15/2021 03:55 PM   Modules accepted: Orders

## 2021-01-15 NOTE — Assessment & Plan Note (Signed)
Check A1c. Has not been on Metformin for several months. I need to restart depending on results of A1c. Also place referral to weight management.

## 2021-01-15 NOTE — Assessment & Plan Note (Signed)
Check lipids. He is on Lipitor 20 mg daily. Tolerating well.

## 2021-01-15 NOTE — Assessment & Plan Note (Signed)
Follows with dermatology. On isotretinoin. Will check requested labs today including CMET and lipid panel.

## 2021-01-16 ENCOUNTER — Other Ambulatory Visit: Payer: Self-pay

## 2021-01-16 ENCOUNTER — Other Ambulatory Visit (INDEPENDENT_AMBULATORY_CARE_PROVIDER_SITE_OTHER): Payer: Medicare Other

## 2021-01-16 DIAGNOSIS — E785 Hyperlipidemia, unspecified: Secondary | ICD-10-CM | POA: Diagnosis not present

## 2021-01-16 DIAGNOSIS — Z0001 Encounter for general adult medical examination with abnormal findings: Secondary | ICD-10-CM

## 2021-01-16 DIAGNOSIS — E8881 Metabolic syndrome: Secondary | ICD-10-CM | POA: Diagnosis not present

## 2021-01-16 DIAGNOSIS — R351 Nocturia: Secondary | ICD-10-CM

## 2021-01-16 LAB — LIPID PANEL
Cholesterol: 127 mg/dL (ref 0–200)
HDL: 27.1 mg/dL — ABNORMAL LOW (ref >39.00)
NonHDL: 100.18
Total CHOL/HDL Ratio: 5
Triglycerides: 293 mg/dL — ABNORMAL HIGH (ref 0.0–149.0)
VLDL: 58.6 mg/dL — ABNORMAL HIGH (ref 0.0–40.0)

## 2021-01-16 LAB — LDL CHOLESTEROL, DIRECT: Direct LDL: 60 mg/dL

## 2021-01-16 LAB — COMPREHENSIVE METABOLIC PANEL
ALT: 53 U/L (ref 0–53)
AST: 41 U/L — ABNORMAL HIGH (ref 0–37)
Albumin: 4 g/dL (ref 3.5–5.2)
Alkaline Phosphatase: 94 U/L (ref 39–117)
BUN: 13 mg/dL (ref 6–23)
CO2: 31 mEq/L (ref 19–32)
Calcium: 9.4 mg/dL (ref 8.4–10.5)
Chloride: 100 mEq/L (ref 96–112)
Creatinine, Ser: 0.93 mg/dL (ref 0.40–1.50)
GFR: 84.25 mL/min (ref >60.00)
Glucose, Bld: 122 mg/dL — ABNORMAL HIGH (ref 70–99)
Potassium: 3.4 mEq/L — ABNORMAL LOW (ref 3.5–5.1)
Sodium: 139 mEq/L (ref 135–145)
Total Bilirubin: 0.5 mg/dL (ref 0.2–1.2)
Total Protein: 7.4 g/dL (ref 6.0–8.3)

## 2021-01-16 LAB — CBC
HCT: 40.8 % (ref 39.0–52.0)
Hemoglobin: 13.8 g/dL (ref 13.0–17.0)
MCHC: 33.8 g/dL (ref 30.0–36.0)
MCV: 89.3 fl (ref 78.0–100.0)
Platelets: 352 10*3/uL (ref 150.0–400.0)
RBC: 4.57 Mil/uL (ref 4.22–5.81)
RDW: 14.8 % (ref 11.5–15.5)
WBC: 10.9 10*3/uL — ABNORMAL HIGH (ref 4.0–10.5)

## 2021-01-16 LAB — PSA: PSA: 0.22 ng/mL (ref 0.10–4.00)

## 2021-01-16 LAB — HEMOGLOBIN A1C: Hgb A1c MFr Bld: 6.7 % — ABNORMAL HIGH (ref 4.6–6.5)

## 2021-01-19 NOTE — Progress Notes (Signed)
Please inform patient of the following:  Blood sugar up a bit but everything else is stable. Recommend we restart metformin Xr 500mg  daily and we can recheck in 3-6 months. He should keep working on diet and exercise.  . Katina Degree, MD 01/19/2021 3:41 PM

## 2021-01-20 ENCOUNTER — Other Ambulatory Visit: Payer: Self-pay

## 2021-01-20 MED ORDER — METFORMIN HCL ER 500 MG PO TB24: 500.0000 mg | ORAL_TABLET | Freq: Every day | ORAL | 3 refills | Status: DC

## 2021-02-04 ENCOUNTER — Telehealth: Payer: Self-pay

## 2021-02-04 NOTE — Telephone Encounter (Signed)
See note

## 2021-02-04 NOTE — Telephone Encounter (Signed)
Patient is calling in stating he had a coronary caclium scan done last Friday and is wondering if Dr.Parker has the results from that.

## 2021-02-04 NOTE — Telephone Encounter (Signed)
His results were at [facility].  I was able to look in Care Everywhere and see them.  He has coronary calcium score is at 95%ile.  Recommend we increase his Lipitor to 40 mg daily and have him follow-up with cardiology  Katina Degree. Jimmey Ralph, MD 02/04/2021 10:57 AM

## 2021-02-05 NOTE — Telephone Encounter (Signed)
Patient will like a call to explain results, advise to schedule appointment  Patient will like a call first

## 2021-02-05 NOTE — Telephone Encounter (Signed)
Unable to contact patient services temporary disconnected

## 2021-02-06 NOTE — Telephone Encounter (Signed)
He has calcium build up in his arteries more than 95% of people in his age and gender. He need to be more aggressive with cardiovascular risk protection.  Needs to schedule visit to discuss further.  We can schedule virtual or telephone visit to discuss if needed.   Katina Degree. Jimmey Ralph, MD 02/06/2021 3:53 PM

## 2021-02-09 ENCOUNTER — Telehealth (INDEPENDENT_AMBULATORY_CARE_PROVIDER_SITE_OTHER): Payer: Medicare Other | Admitting: Family Medicine

## 2021-02-09 DIAGNOSIS — E785 Hyperlipidemia, unspecified: Secondary | ICD-10-CM | POA: Diagnosis not present

## 2021-02-09 DIAGNOSIS — E7849 Other hyperlipidemia: Secondary | ICD-10-CM | POA: Diagnosis not present

## 2021-02-09 DIAGNOSIS — I2583 Coronary atherosclerosis due to lipid rich plaque: Secondary | ICD-10-CM | POA: Diagnosis not present

## 2021-02-09 DIAGNOSIS — I2510 Atherosclerotic heart disease of native coronary artery without angina pectoris: Secondary | ICD-10-CM | POA: Diagnosis not present

## 2021-02-09 MED ORDER — ATORVASTATIN CALCIUM 20 MG PO TABS: 40.0000 mg | ORAL_TABLET | Freq: Every day | ORAL | 0 refills | Status: DC

## 2021-02-09 NOTE — Telephone Encounter (Signed)
Patient is calling in stating that he spoke with Dr.Parkers nurse who could explain his lab results. So patient wants to speak with Dr.Parker immediately, did try to advise patient to schedule an appointment but declined.

## 2021-02-09 NOTE — Progress Notes (Signed)
   Roy Avila is a 70 y.o. male who presents today for a telephone visit.  Assessment/Plan:  Chronic Problems Addressed Today: Dyslipidemia Last LDL 60.  We increased Lipitor to 40 mg daily due to elevated cardiac calcium score.  Will need to recheck lipids.  6 to 12 months.  Coronary artery disease due to lipid rich plaque Found on cardiac calcium score 10 days ago.  Is not currently having any symptoms.  Will check echocardiogram to assess functional status.  Will be more aggressive with risk factor modification including switching Lipitor to 40 mg daily.     Subjective:  HPI:  Patient had CT calcium score done 10 days ago.  He would like to go over the results today.  Not currently having any symptoms.       Objective/Observations   NAD  Telephone Visit   I connected with Roy Avila on 02/09/21 at  4:00 PM EDT via telephone and verified that I am speaking with the correct person using two identifiers. I discussed the limitations of evaluation and management by telemedicine and the availability of in person appointments. The patient expressed understanding and agreed to proceed.   Patient location: Home Provider location: [place] [employer] Persons participating in the virtual visit: Myself and Patient  A total of 5 minutes were spent on medical discussion.      Katina Degree. Jimmey Ralph, MD 02/09/2021 2:20 PM

## 2021-02-09 NOTE — Assessment & Plan Note (Signed)
Found on cardiac calcium score 10 days ago.  Is not currently having any symptoms.  Will check echocardiogram to assess functional status.  Will be more aggressive with risk factor modification including switching Lipitor to 40 mg daily.

## 2021-02-09 NOTE — Assessment & Plan Note (Signed)
Last LDL 60.  We increased Lipitor to 40 mg daily due to elevated cardiac calcium score.  Will need to recheck lipids.  6 to 12 months.

## 2021-02-09 NOTE — Telephone Encounter (Signed)
Patient spoke with Dr.Parker

## 2021-02-17 ENCOUNTER — Ambulatory Visit (INDEPENDENT_AMBULATORY_CARE_PROVIDER_SITE_OTHER): Payer: Medicare Other | Admitting: Bariatrics

## 2021-02-17 ENCOUNTER — Other Ambulatory Visit: Payer: Self-pay

## 2021-02-17 ENCOUNTER — Encounter (INDEPENDENT_AMBULATORY_CARE_PROVIDER_SITE_OTHER): Payer: Self-pay | Admitting: Bariatrics

## 2021-02-17 VITALS — BP 138/81 | HR 73 | Temp 98.5°F | Ht 71.0 in | Wt 258.0 lb

## 2021-02-17 DIAGNOSIS — E559 Vitamin D deficiency, unspecified: Secondary | ICD-10-CM

## 2021-02-17 DIAGNOSIS — E1169 Type 2 diabetes mellitus with other specified complication: Secondary | ICD-10-CM | POA: Diagnosis not present

## 2021-02-17 DIAGNOSIS — I10 Essential (primary) hypertension: Secondary | ICD-10-CM

## 2021-02-17 DIAGNOSIS — R0602 Shortness of breath: Secondary | ICD-10-CM

## 2021-02-17 DIAGNOSIS — G4733 Obstructive sleep apnea (adult) (pediatric): Secondary | ICD-10-CM

## 2021-02-17 DIAGNOSIS — Z0289 Encounter for other administrative examinations: Secondary | ICD-10-CM

## 2021-02-17 DIAGNOSIS — E785 Hyperlipidemia, unspecified: Secondary | ICD-10-CM

## 2021-02-17 DIAGNOSIS — Z6835 Body mass index (BMI) 35.0-35.9, adult: Secondary | ICD-10-CM

## 2021-02-17 DIAGNOSIS — R5383 Other fatigue: Secondary | ICD-10-CM | POA: Diagnosis not present

## 2021-02-17 DIAGNOSIS — E66812 Obesity, class 2: Secondary | ICD-10-CM

## 2021-02-17 DIAGNOSIS — Z1331 Encounter for screening for depression: Secondary | ICD-10-CM | POA: Diagnosis not present

## 2021-02-17 DIAGNOSIS — E669 Obesity, unspecified: Secondary | ICD-10-CM

## 2021-02-18 ENCOUNTER — Encounter (INDEPENDENT_AMBULATORY_CARE_PROVIDER_SITE_OTHER): Payer: Self-pay | Admitting: Bariatrics

## 2021-02-18 DIAGNOSIS — E559 Vitamin D deficiency, unspecified: Secondary | ICD-10-CM | POA: Insufficient documentation

## 2021-02-19 LAB — MICROALBUMIN / CREATININE URINE RATIO
Creatinine, Urine: 229.6 mg/dL
Microalb/Creat Ratio: 26 mg/g creat (ref 0–29)
Microalbumin, Urine: 58.9 ug/mL

## 2021-02-19 LAB — T4, FREE: Free T4: 1.04 ng/dL (ref 0.82–1.77)

## 2021-02-19 LAB — C-PEPTIDE: C-Peptide: 5.4 ng/mL — ABNORMAL HIGH (ref 1.1–4.4)

## 2021-02-19 LAB — T3: T3, Total: 143 ng/dL (ref 71–180)

## 2021-02-19 LAB — TSH: TSH: 2.16 u[IU]/mL (ref 0.450–4.500)

## 2021-02-19 LAB — VITAMIN D 25 HYDROXY (VIT D DEFICIENCY, FRACTURES): Vit D, 25-Hydroxy: 21.7 ng/mL — ABNORMAL LOW (ref 30.0–100.0)

## 2021-02-19 LAB — INSULIN, RANDOM: INSULIN: 40.8 u[IU]/mL — ABNORMAL HIGH (ref 2.6–24.9)

## 2021-02-19 NOTE — Progress Notes (Signed)
Chief Complaint:   OBESITY Roy Avila (MR# [id number]) is a 70 y.o. male who presents for evaluation and treatment of obesity and related comorbidities. Current BMI is Body mass index is 35.98 kg/m. Roy Avila has been struggling with his weight for many years and has been unsuccessful in either losing weight, maintaining weight loss, or reaching his healthy weight goal.  Roy Avila does not cook, and he craves sweets. He was last seen by Dr. Lawson Avila on 02/01/2019.  Roy Avila is currently in the action stage of change and ready to dedicate time achieving and maintaining a healthier weight. Roy Avila is interested in becoming our patient and working on intensive lifestyle modifications including (but not limited to) diet and exercise for weight loss.  Roy Avila habits were reviewed today and are as follows: His family eats meals together, he thinks his family will eat healthier with him, his desired weight loss is 48 lbs, he started gaining weight 9 years ago, his heaviest weight ever was 265 pounds, he is a picky eater and doesn't like to eat healthier foods, he has significant food cravings issues, he snacks frequently in the evenings, he is frequently drinking liquids with calories, he frequently makes poor food choices, he frequently eats larger portions than normal and he struggles with emotional eating.  Depression Screen Roy Avila's Food and Mood (modified PHQ-9) score was 10.  Depression screen [facility] 2/9 02/17/2021  Decreased Interest 2  Down, Depressed, Hopeless 1  PHQ - 2 Score 3  Altered sleeping 1  Tired, decreased energy 3  Change in appetite 2  Feeling bad or failure about yourself  1  Trouble concentrating 0  Moving slowly or fidgety/restless 0  Suicidal thoughts 0  PHQ-9 Score 10  Difficult doing work/chores Not difficult at all   Subjective:   1. Other fatigue Tai admits to daytime somnolence and denies waking up still tired. Patent has a history of symptoms of daytime  fatigue. Roy Avila generally gets 7 hours of sleep per night, and states that he has generally restful sleep. Snoring is not present. Apneic episodes are not present. Epworth Sleepiness Score is 11.  2. SOB (shortness of breath) on exertion Roy Avila notes increasing shortness of breath with exercising and seems to be worsening over time with weight gain. He notes getting out of breath sooner with activity than he used to. This has not gotten worse recently. Roy Avila denies shortness of breath at rest or orthopnea.  3. Dyslipidemia Roy Avila is taking atorvastatin (recent increase in statin). He had a recent cardiac scoring CT.  4. Diabetes mellitus type 2 in obese Roy Avila is taking metformin, and last A1c was 6.7.  5. Essential hypertension Roy Avila is taking amlodipine and Benicar.  6. Obstructive sleep apnea Roy Avila wears his CPAP nightly.  7. Vitamin D deficiency Roy Avila is not currently taking Vit D.  Assessment/Plan:   1. Other fatigue Roy Avila does feel that his weight is causing his energy to be lower than it should be. Fatigue may be related to obesity, depression or many other causes. Labs will be ordered, and in the meanwhile, Roy Avila will focus on self care including making healthy food choices, increasing physical activity and focusing on stress reduction.  - EKG 12-Lead - Insulin, random - T3 - T4, free - TSH  2. SOB (shortness of breath) on exertion Roy Avila does feel that he gets out of breath more easily that he used to when he exercises. Roy Avila's shortness of breath appears to be obesity related and exercise  induced. He has agreed to work on weight loss and gradually increase exercise to treat his exercise induced shortness of breath. Will continue to monitor closely.  3. Dyslipidemia Cardiovascular risk and specific lipid/LDL goals reviewed.  We discussed several lifestyle modifications today. Roy Avila will continue his medications, and will continue to work on diet,  exercise and weight loss efforts. He will follow up with his primary care physician. Orders and follow up as documented in patient record.   Counseling Intensive lifestyle modifications are the first line treatment for this issue. . Dietary changes: Increase soluble fiber. Decrease simple carbohydrates. . Exercise changes: Moderate to vigorous-intensity aerobic activity 150 minutes per week if tolerated. . Lipid-lowering medications: see documented in medical record.  4. Diabetes mellitus type 2 in obese (HCC) Good blood sugar control is important to decrease the likelihood of diabetic complications such as nephropathy, neuropathy, limb loss, blindness, coronary artery disease, and death. Intensive lifestyle modification including diet, exercise and weight loss are the first line of treatment for diabetes. We will check labs today. Roy Avila will continue his medications, and will follow up as directed.  - Insulin, random - Microalbumin / creatinine urine ratio - C-peptide  5. Essential hypertension Roy Avila will continue his medications, and will continue working on healthy weight loss and exercise to improve blood pressure control. We will watch for signs of hypotension as he continues his lifestyle modifications.  6. Obstructive sleep apnea Intensive lifestyle modifications are the first line treatment for this issue. We discussed several lifestyle modifications today. Roy Avila will continue wearing his CPAP, and will continue to work on diet, exercise and weight loss efforts. We will continue to monitor. Orders and follow up as documented in patient record.   7. Vitamin D deficiency Low Vitamin D level contributes to fatigue and are associated with obesity, breast, and colon cancer. We will check labs today. Roy Avila will follow-up for routine testing of Vitamin D, at least 2-3 times per year to avoid over-replacement.  - VITAMIN D 25 Hydroxy (Vit-D Deficiency, Fractures)  8. Depression  screen Roy Avila had a positive depression screening. Depression is commonly associated with obesity and often results in emotional eating behaviors. We will monitor this closely and work on CBT to help improve the non-hunger eating patterns. Referral to Psychology may be required if no improvement is seen as he continues in our clinic.  9. Class 2 severe obesity due to excess calories with serious comorbidity and body mass index (BMI) of 35.0 to 35.9 in adult [facility]) Roy Avila is currently in the action stage of change and his goal is to continue with weight loss efforts. I recommend Roy Avila begin the structured treatment plan as follows:  He has agreed to the Category 3 Plan.  Intentional eating was discussed, and I reviewed labs from 01/16/2021 with the patient today.  Exercise goals: No exercise has been prescribed at this time.   Behavioral modification strategies: increasing lean protein intake, decreasing simple carbohydrates, increasing vegetables, increasing water intake, decreasing eating out, no skipping meals, meal planning and cooking strategies, keeping healthy foods in the home and planning for success.  He was informed of the importance of frequent follow-up visits to maximize his success with intensive lifestyle modifications for his multiple health conditions. He was informed we would discuss his lab results at his next visit unless there is a critical issue that needs to be addressed sooner. Roy Avila agreed to keep his next visit at the agreed upon time to discuss these results.  Objective:  Blood pressure 138/81, pulse 73, temperature 98.5 F (36.9 C), height 5\' 11"  (1.803 m), weight 258 lb (117 kg), SpO2 93 %. Body mass index is 35.98 kg/m.  EKG: Normal sinus rhythm, rate 79 BPM.  Indirect Calorimeter completed today shows a VO2 of 301 and a REE of 2098.  His calculated basal metabolic rate is 2099 thus his basal metabolic rate is worse than expected.  General: Cooperative,  alert, well developed, in no acute distress. HEENT: Conjunctivae and lids unremarkable. Cardiovascular: Regular rhythm.  Lungs: Normal work of breathing. Neurologic: No focal deficits.   Lab Results  Component Value Date   CREATININE 0.93 01/16/2021   BUN 13 01/16/2021   NA 139 01/16/2021   K 3.4 (L) 01/16/2021   CL 100 01/16/2021   CO2 31 01/16/2021   Lab Results  Component Value Date   ALT 53 01/16/2021   AST 41 (H) 01/16/2021   ALKPHOS 94 01/16/2021   BILITOT 0.5 01/16/2021   Lab Results  Component Value Date   HGBA1C 6.7 (H) 01/16/2021   HGBA1C 6.6 (H) 12/18/2019   HGBA1C 6.8 (H) 07/30/2019   HGBA1C 6.5 11/16/2018   HGBA1C 6.2 10/05/2017   Lab Results  Component Value Date   INSULIN 40.8 (H) 02/17/2021   INSULIN 32.2 (H) 01/18/2019   Lab Results  Component Value Date   TSH 2.160 02/17/2021   Lab Results  Component Value Date   CHOL 127 01/16/2021   HDL 27.10 (L) 01/16/2021   LDLCALC 49 07/30/2019   LDLDIRECT 60.0 01/16/2021   TRIG 293.0 (H) 01/16/2021   CHOLHDL 5 01/16/2021   Lab Results  Component Value Date   WBC 10.9 (H) 01/16/2021   HGB 13.8 01/16/2021   HCT 40.8 01/16/2021   MCV 89.3 01/16/2021   PLT 352.0 01/16/2021   No results found for: IRON, TIBC, FERRITIN Obesity Behavioral Intervention:   Approximately 15 minutes were spent on the discussion below.  ASK: We discussed the diagnosis of obesity with 01/18/2021 today and Roy Avila agreed to give Roy Avila permission to discuss obesity behavioral modification therapy today.  ASSESS: Pranav has the diagnosis of obesity and his BMI today is 36. Keshav is in the action stage of change.   ADVISE: Aristotelis was educated on the multiple health risks of obesity as well as the benefit of weight loss to improve his health. He was advised of the need for long term treatment and the importance of lifestyle modifications to improve his current health and to decrease his risk of future health  problems.  AGREE: Multiple dietary modification options and treatment options were discussed and Lea agreed to follow the recommendations documented in the above note.  ARRANGE: Roy Avila was educated on the importance of frequent visits to treat obesity as outlined per CMS and USPSTF guidelines and agreed to schedule his next follow up appointment today.  Attestation Statements:   Reviewed by clinician on day of visit: allergies, medications, problem list, medical history, surgical history, family history, social history, and previous encounter notes.   Roy Avila, am acting as Trude Mcburney for Energy manager, DO.  I have reviewed the above documentation for accuracy and completeness, and I agree with the above. [employer], DO

## 2021-02-22 ENCOUNTER — Encounter (INDEPENDENT_AMBULATORY_CARE_PROVIDER_SITE_OTHER): Payer: Self-pay | Admitting: Bariatrics

## 2021-03-03 ENCOUNTER — Other Ambulatory Visit: Payer: Self-pay

## 2021-03-03 ENCOUNTER — Ambulatory Visit (INDEPENDENT_AMBULATORY_CARE_PROVIDER_SITE_OTHER): Payer: Medicare Other | Admitting: Bariatrics

## 2021-03-03 ENCOUNTER — Encounter (INDEPENDENT_AMBULATORY_CARE_PROVIDER_SITE_OTHER): Payer: Self-pay | Admitting: Bariatrics

## 2021-03-03 VITALS — BP 144/77 | HR 73 | Temp 98.1°F | Ht 71.0 in | Wt 256.0 lb

## 2021-03-03 DIAGNOSIS — E1169 Type 2 diabetes mellitus with other specified complication: Secondary | ICD-10-CM

## 2021-03-03 DIAGNOSIS — E66812 Obesity, class 2: Secondary | ICD-10-CM

## 2021-03-03 DIAGNOSIS — E559 Vitamin D deficiency, unspecified: Secondary | ICD-10-CM

## 2021-03-03 DIAGNOSIS — G4733 Obstructive sleep apnea (adult) (pediatric): Secondary | ICD-10-CM | POA: Diagnosis not present

## 2021-03-03 DIAGNOSIS — Z6835 Body mass index (BMI) 35.0-35.9, adult: Secondary | ICD-10-CM

## 2021-03-03 DIAGNOSIS — E669 Obesity, unspecified: Secondary | ICD-10-CM | POA: Diagnosis not present

## 2021-03-03 MED ORDER — VITAMIN D (ERGOCALCIFEROL) 1.25 MG (50000 UNIT) PO CAPS: 50000.0000 [IU] | ORAL_CAPSULE | ORAL | 0 refills | Status: DC

## 2021-03-09 ENCOUNTER — Encounter (INDEPENDENT_AMBULATORY_CARE_PROVIDER_SITE_OTHER): Payer: Self-pay | Admitting: Bariatrics

## 2021-03-09 NOTE — Progress Notes (Signed)
Chief Complaint:   OBESITY Roy Avila is here to discuss his progress with his obesity treatment plan along with follow-up of his obesity related diagnoses. Roy Avila is on the Category 3 Plan and states he is following his eating plan approximately 95% of the time. Roy Avila states he is not exercising regularly at this time.  Today's visit was #: 2 Starting weight: 258 lbs Starting date: 02/17/2021 Today's weight: 256 lbs Today's date: 03/03/2021 Total lbs lost to date: 2 lbs Total lbs lost since last in-office visit: 2 lbs  Interim History: Roy Avila is down 2 pounds since his first visit.  Subjective:   1. Vitamin D deficiency Roy Avila's Vitamin D level was 21.7 on 02/17/2021. He is currently taking no vitamin D supplement. He denies nausea, vomiting or muscle weakness.  2. 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  Insulin 40.8, A1c 5.4 (A1c 6.7, insulin 40.8).  He will get glucose tablets and use as needed.  Lab Results  Component Value Date   HGBA1C 6.7 (H) 01/16/2021   HGBA1C 6.6 (H) 12/18/2019   HGBA1C 6.8 (H) 07/30/2019   Lab Results  Component Value Date   LDLCALC 49 07/30/2019   CREATININE 0.93 01/16/2021   Lab Results  Component Value Date   INSULIN 40.8 (H) 02/17/2021   INSULIN 32.2 (H) 01/18/2019   3. Obstructive sleep apnea He is taking naps and using his CPAP.  Assessment/Plan:   1. Vitamin D deficiency Low Vitamin D level contributes to fatigue and are associated with obesity, breast, and colon cancer. He agrees to start to take prescription Vitamin D @50 ,000 IU every week and will follow-up for routine testing of Vitamin D, at least 2-3 times per year to avoid over-replacement.  - Start Vitamin D, Ergocalciferol, (DRISDOL) 1.25 MG (50000 UNIT) CAPS capsule; Take 1 capsule (50,000 Units total) by mouth every 7 (seven) days.  Dispense: 4 capsule; Refill: 0  2. Diabetes mellitus type 2 in obese (HCC) Good blood sugar control is  important to decrease the likelihood of diabetic complications such as nephropathy, neuropathy, limb loss, blindness, coronary artery disease, and death. Intensive lifestyle modification including diet, exercise and weight loss are the first line of treatment for diabetes.  Decrease carbohydrates, increase healthy fats and proteins, monitor for hypoglycemia or low blood sugars.  3. Obstructive sleep apnea Intensive lifestyle modifications are the first line treatment for this issue. We discussed several lifestyle modifications today and he will continue to work on diet, exercise and weight loss efforts. We will continue to monitor. Orders and follow up as documented in patient record.  He will continue to use his CPAP.  4. Obesity, current BMI 35  Roy Avila is currently in the action stage of change. As such, his goal is to continue with weight loss efforts. He has agreed to the Category 3 Plan.   He will work on adhering closely to the plan and meal planning.  We discussed vitamin D, A1c, thyroid panel, and microalbumin.  Exercise goals: He is just starting to exercise and feels fatigued.  Use stationary bike and resistance bands.  Behavioral modification strategies: increasing lean protein intake, decreasing simple carbohydrates, increasing vegetables, increasing water intake, decreasing eating out, no skipping meals, meal planning and cooking strategies, keeping healthy foods in the home and planning for success.  Roy Avila has agreed to follow-up with our clinic in 2 weeks. He was informed of the importance of frequent follow-up visits to maximize his success with intensive lifestyle  modifications for his multiple health conditions.   Objective:   Blood pressure (!) 144/77, pulse 73, temperature 98.1 F (36.7 C), height 5\' 11"  (1.803 m), weight 256 lb (116.1 kg), SpO2 94 %. Body mass index is 35.7 kg/m.  General: Cooperative, alert, well developed, in no acute distress. HEENT: Conjunctivae  and lids unremarkable. Cardiovascular: Regular rhythm.  Lungs: Normal work of breathing. Neurologic: No focal deficits.   Lab Results  Component Value Date   CREATININE 0.93 01/16/2021   BUN 13 01/16/2021   NA 139 01/16/2021   K 3.4 (L) 01/16/2021   CL 100 01/16/2021   CO2 31 01/16/2021   Lab Results  Component Value Date   ALT 53 01/16/2021   AST 41 (H) 01/16/2021   ALKPHOS 94 01/16/2021   BILITOT 0.5 01/16/2021   Lab Results  Component Value Date   HGBA1C 6.7 (H) 01/16/2021   HGBA1C 6.6 (H) 12/18/2019   HGBA1C 6.8 (H) 07/30/2019   HGBA1C 6.5 11/16/2018   HGBA1C 6.2 10/05/2017   Lab Results  Component Value Date   INSULIN 40.8 (H) 02/17/2021   INSULIN 32.2 (H) 01/18/2019   Lab Results  Component Value Date   TSH 2.160 02/17/2021   Lab Results  Component Value Date   CHOL 127 01/16/2021   HDL 27.10 (L) 01/16/2021   LDLCALC 49 07/30/2019   LDLDIRECT 60.0 01/16/2021   TRIG 293.0 (H) 01/16/2021   CHOLHDL 5 01/16/2021   Lab Results  Component Value Date   WBC 10.9 (H) 01/16/2021   HGB 13.8 01/16/2021   HCT 40.8 01/16/2021   MCV 89.3 01/16/2021   PLT 352.0 01/16/2021   Obesity Behavioral Intervention:   Approximately 15 minutes were spent on the discussion below.  ASK: We discussed the diagnosis of obesity with 01/18/2021 today and Roy Avila agreed to give Roy Avila permission to discuss obesity behavioral modification therapy today.  ASSESS: Roy Avila has the diagnosis of obesity and his BMI today is 35.7. Roy Avila is in the action stage of change.   ADVISE: Roy Avila was educated on the multiple health risks of obesity as well as the benefit of weight loss to improve his health. He was advised of the need for long term treatment and the importance of lifestyle modifications to improve his current health and to decrease his risk of future health problems.  AGREE: Multiple dietary modification options and treatment options were discussed and Roy Avila agreed to follow the  recommendations documented in the above note.  ARRANGE: Roy Avila was educated on the importance of frequent visits to treat obesity as outlined per CMS and USPSTF guidelines and agreed to schedule his next follow up appointment today.  Attestation Statements:   Reviewed by clinician on day of visit: allergies, medications, problem list, medical history, surgical history, family history, social history, and previous encounter notes.  I, Roy Avila, CMA, am acting as Insurance claims handler for Energy manager, DO  I have reviewed the above documentation for accuracy and completeness, and I agree with the above. [employer], DO

## 2021-03-17 ENCOUNTER — Other Ambulatory Visit: Payer: Self-pay

## 2021-03-17 ENCOUNTER — Ambulatory Visit (HOSPITAL_COMMUNITY): Payer: Medicare Other | Attending: Cardiovascular Disease

## 2021-03-17 DIAGNOSIS — E785 Hyperlipidemia, unspecified: Secondary | ICD-10-CM | POA: Insufficient documentation

## 2021-03-17 DIAGNOSIS — I2510 Atherosclerotic heart disease of native coronary artery without angina pectoris: Secondary | ICD-10-CM | POA: Insufficient documentation

## 2021-03-17 DIAGNOSIS — I2583 Coronary atherosclerosis due to lipid rich plaque: Secondary | ICD-10-CM | POA: Diagnosis present

## 2021-03-17 LAB — ECHOCARDIOGRAM COMPLETE
Area-P 1/2: 2.42 cm2
S' Lateral: 3.4 cm

## 2021-03-17 NOTE — Progress Notes (Signed)
Please inform patient of the following:  Echocardiogram is normal.  He has normal heart function.  Do not need to do any other testing at this point.  Would like for him to continue to work on diet and exercise continue current medications.

## 2021-03-18 ENCOUNTER — Other Ambulatory Visit: Payer: Self-pay

## 2021-03-18 ENCOUNTER — Ambulatory Visit (INDEPENDENT_AMBULATORY_CARE_PROVIDER_SITE_OTHER): Payer: Medicare Other | Admitting: Bariatrics

## 2021-03-18 VITALS — BP 129/74 | HR 68 | Temp 98.0°F | Ht 71.0 in | Wt 252.0 lb

## 2021-03-18 DIAGNOSIS — I10 Essential (primary) hypertension: Secondary | ICD-10-CM

## 2021-03-18 DIAGNOSIS — E559 Vitamin D deficiency, unspecified: Secondary | ICD-10-CM

## 2021-03-18 DIAGNOSIS — Z6835 Body mass index (BMI) 35.0-35.9, adult: Secondary | ICD-10-CM

## 2021-03-18 MED ORDER — VITAMIN D (ERGOCALCIFEROL) 1.25 MG (50000 UNIT) PO CAPS
50000.0000 [IU] | ORAL_CAPSULE | ORAL | 0 refills | Status: DC
Start: 2021-03-18 — End: 2021-04-29

## 2021-03-19 ENCOUNTER — Encounter (INDEPENDENT_AMBULATORY_CARE_PROVIDER_SITE_OTHER): Payer: Self-pay | Admitting: Bariatrics

## 2021-03-19 NOTE — Progress Notes (Signed)
Chief Complaint:   OBESITY Roy Avila is here to discuss his progress with his obesity treatment plan along with follow-up of his obesity related diagnoses. Roy Avila is on the Category 3 Plan and states he is following his eating plan approximately 80% of the time. Beth states he is not exercising regularly.  Today's visit was #: 3 Starting weight: 258 lbs Starting date: 02/17/2021 Today's weight: 252 lbs Today's date: 03/18/2021 Total lbs lost to date: 6 lbs Total lbs lost since last in-office visit: 4 lbs  Interim History: Roy Avila is down an additional 4 pounds since his last visit.  Subjective:   1. Essential hypertension Review: taking medications as instructed, no medication side effects noted, no chest pain on exertion, no dyspnea on exertion, no swelling of ankles.  Taking Norvasc and Benicar.   BP Readings from Last 3 Encounters:  03/18/21 129/74  03/03/21 (!) 144/77  02/17/21 138/81   2. Vitamin D deficiency Roy Avila's Vitamin D level was 21.7 on 02/17/2021. He is currently taking prescription vitamin D 50,000 IU each week. He denies nausea, vomiting or muscle weakness.  Minimal sun exposure.  Assessment/Plan:   1. Essential hypertension Roy Avila is working on healthy weight loss and exercise to improve blood pressure control. We will watch for signs of hypotension as he continues his lifestyle modifications.  Continue medications.  Decrease carbohydrates (all types).  Increase exercise.   2. Vitamin D deficiency Low Vitamin D level contributes to fatigue and are associated with obesity, breast, and colon cancer. He agrees to continue to take prescription Vitamin D @50 ,000 IU every week and will follow-up for routine testing of Vitamin D, at least 2-3 times per year to avoid over-replacement.    - Refill Vitamin D, Ergocalciferol, (DRISDOL) 1.25 MG (50000 UNIT) CAPS capsule; Take 1 capsule (50,000 Units total) by mouth every 7 (seven) days.  Dispense: 4 capsule; Refill:  0  3. Obesity, current BMI 35  Roy Avila is currently in the action stage of change. As such, his goal is to continue with weight loss efforts. He has agreed to the Category 3 Plan.   He will work on meal planning and intentional eating.  Exercise goals: Stationary bike and resistance bands.  Behavioral modification strategies: increasing lean protein intake, decreasing simple carbohydrates, increasing vegetables, increasing water intake, decreasing eating out, no skipping meals, meal planning and cooking strategies, keeping healthy foods in the home and planning for success.  Roy Avila has agreed to follow-up with our clinic in 2-3 weeks. He was informed of the importance of frequent follow-up visits to maximize his success with intensive lifestyle modifications for his multiple health conditions.   Objective:   Blood pressure 129/74, pulse 68, temperature 98 F (36.7 C), height 5\' 11"  (1.803 m), weight 252 lb (114.3 kg), SpO2 96 %. Body mass index is 35.15 kg/m.  General: Cooperative, alert, well developed, in no acute distress. HEENT: Conjunctivae and lids unremarkable. Cardiovascular: Regular rhythm.  Lungs: Normal work of breathing. Neurologic: No focal deficits.   Lab Results  Component Value Date   CREATININE 0.93 01/16/2021   BUN 13 01/16/2021   NA 139 01/16/2021   K 3.4 (L) 01/16/2021   CL 100 01/16/2021   CO2 31 01/16/2021   Lab Results  Component Value Date   ALT 53 01/16/2021   AST 41 (H) 01/16/2021   ALKPHOS 94 01/16/2021   BILITOT 0.5 01/16/2021   Lab Results  Component Value Date   HGBA1C 6.7 (H) 01/16/2021   HGBA1C 6.6 (  H) 12/18/2019   HGBA1C 6.8 (H) 07/30/2019   HGBA1C 6.5 11/16/2018   HGBA1C 6.2 10/05/2017   Lab Results  Component Value Date   INSULIN 40.8 (H) 02/17/2021   INSULIN 32.2 (H) 01/18/2019   Lab Results  Component Value Date   TSH 2.160 02/17/2021   Lab Results  Component Value Date   CHOL 127 01/16/2021   HDL 27.10 (L)  01/16/2021   LDLCALC 49 07/30/2019   LDLDIRECT 60.0 01/16/2021   TRIG 293.0 (H) 01/16/2021   CHOLHDL 5 01/16/2021   Lab Results  Component Value Date   WBC 10.9 (H) 01/16/2021   HGB 13.8 01/16/2021   HCT 40.8 01/16/2021   MCV 89.3 01/16/2021   PLT 352.0 01/16/2021   Obesity Behavioral Intervention:   Approximately 15 minutes were spent on the discussion below.  ASK: We discussed the diagnosis of obesity with [name] today and Alanmichael agreed to give [name] permission to discuss obesity behavioral modification therapy today.  ASSESS: Alphonza has the diagnosis of obesity and his BMI today is 35.1. Duwan is in the action stage of change.   ADVISE: Nolyn was educated on the multiple health risks of obesity as well as the benefit of weight loss to improve his health. He was advised of the need for long term treatment and the importance of lifestyle modifications to improve his current health and to decrease his risk of future health problems.  AGREE: Multiple dietary modification options and treatment options were discussed and Daleon agreed to follow the recommendations documented in the above note.  ARRANGE: Allen was educated on the importance of frequent visits to treat obesity as outlined per CMS and USPSTF guidelines and agreed to schedule his next follow up appointment today.  Attestation Statements:   Reviewed by clinician on day of visit: allergies, medications, problem list, medical history, surgical history, family history, social history, and previous encounter notes.  I, Insurance claims handler, CMA, am acting as Energy manager for [employer], DO  I have reviewed the above documentation for accuracy and completeness, and I agree with the above. [name], DO

## 2021-03-20 ENCOUNTER — Other Ambulatory Visit: Payer: Self-pay | Admitting: Family Medicine

## 2021-03-20 DIAGNOSIS — E7849 Other hyperlipidemia: Secondary | ICD-10-CM

## 2021-03-27 ENCOUNTER — Other Ambulatory Visit: Payer: Self-pay | Admitting: Family Medicine

## 2021-03-27 DIAGNOSIS — I10 Essential (primary) hypertension: Secondary | ICD-10-CM

## 2021-03-30 ENCOUNTER — Other Ambulatory Visit: Payer: Self-pay | Admitting: *Deleted

## 2021-03-30 MED ORDER — METFORMIN HCL ER 500 MG PO TB24: 500.0000 mg | ORAL_TABLET | Freq: Every day | ORAL | 3 refills | Status: DC

## 2021-04-29 ENCOUNTER — Ambulatory Visit (INDEPENDENT_AMBULATORY_CARE_PROVIDER_SITE_OTHER): Payer: Medicare Other | Admitting: Bariatrics

## 2021-04-29 ENCOUNTER — Other Ambulatory Visit: Payer: Self-pay

## 2021-04-29 ENCOUNTER — Encounter (INDEPENDENT_AMBULATORY_CARE_PROVIDER_SITE_OTHER): Payer: Self-pay | Admitting: Bariatrics

## 2021-04-29 VITALS — BP 137/81 | HR 66 | Temp 98.0°F | Ht 71.0 in | Wt 247.0 lb

## 2021-04-29 DIAGNOSIS — Z6835 Body mass index (BMI) 35.0-35.9, adult: Secondary | ICD-10-CM | POA: Diagnosis not present

## 2021-04-29 DIAGNOSIS — E559 Vitamin D deficiency, unspecified: Secondary | ICD-10-CM

## 2021-04-29 DIAGNOSIS — I10 Essential (primary) hypertension: Secondary | ICD-10-CM

## 2021-04-29 MED ORDER — VITAMIN D (ERGOCALCIFEROL) 1.25 MG (50000 UNIT) PO CAPS: 50000.0000 [IU] | ORAL_CAPSULE | ORAL | 0 refills | Status: DC

## 2021-05-04 ENCOUNTER — Encounter (INDEPENDENT_AMBULATORY_CARE_PROVIDER_SITE_OTHER): Payer: Self-pay | Admitting: Bariatrics

## 2021-05-04 NOTE — Progress Notes (Signed)
Chief Complaint:   OBESITY Roy Avila is here to discuss his progress with his obesity treatment plan along with follow-up of his obesity related diagnoses. Roy Avila is on the Category 3 Plan and states he is following his eating plan approximately 80% of the time. Roy Avila states he is not currently exercising.  Today's visit was #: 4 Starting weight: 258 lbs Starting date: 02/17/2021 Today's weight: 247 lbs Today's date: 04/29/2021 Total lbs lost to date: 11 Total lbs lost since last in-office visit: 5  Interim History: [name] is down an additional 5 lbs and doing well overall.  Subjective:   1. Essential hypertension Roy Avila is taking Benicar HCT and Norvasc.  2. Vitamin D deficiency Roy Avila is taking prescription Vit D as directed.  Assessment/Plan:   1. Essential hypertension Roy Avila is working on healthy weight loss and exercise to improve blood pressure control. We will watch for signs of hypotension as he continues his lifestyle modifications. Continue current treatment plan.  2. Vitamin D deficiency Low Vitamin D level contributes to fatigue and are associated with obesity, breast, and colon cancer. He agrees to continue to take prescription Vitamin D @50 ,000 IU every week and will follow-up for routine testing of Vitamin D, at least 2-3 times per year to avoid over-replacement. - Vitamin D, Ergocalciferol, (DRISDOL) 1.25 MG (50000 UNIT) CAPS capsule; Take 1 capsule (50,000 Units total) by mouth every 7 (seven) days.  Dispense: 4 capsule; Refill: 0  3. Obesity, current BMI 34 Roy Avila is currently in the action stage of change. As such, his goal is to continue with weight loss efforts. He has agreed to the Category 3 Plan.   Meal plan Intentional eating  Exercise goals: As is  Behavioral modification strategies: increasing lean protein intake, decreasing simple carbohydrates, increasing vegetables, increasing water intake, decreasing eating out, no skipping meals,  meal planning and cooking strategies, keeping healthy foods in the home and planning for success.  Roy Avila has agreed to follow-up with our clinic in 2 weeks. He was informed of the importance of frequent follow-up visits to maximize his success with intensive lifestyle modifications for his multiple health conditions.   Objective:   Blood pressure 137/81, pulse 66, temperature 98 F (36.7 C), height 5\' 11"  (1.803 m), weight 247 lb (112 kg), SpO2 97 %. Body mass index is 34.45 kg/m.  General: Cooperative, alert, well developed, in no acute distress. HEENT: Conjunctivae and lids unremarkable. Cardiovascular: Regular rhythm.  Lungs: Normal work of breathing. Neurologic: No focal deficits.   Lab Results  Component Value Date   CREATININE 0.93 01/16/2021   BUN 13 01/16/2021   NA 139 01/16/2021   K 3.4 (L) 01/16/2021   CL 100 01/16/2021   CO2 31 01/16/2021   Lab Results  Component Value Date   ALT 53 01/16/2021   AST 41 (H) 01/16/2021   ALKPHOS 94 01/16/2021   BILITOT 0.5 01/16/2021   Lab Results  Component Value Date   HGBA1C 6.7 (H) 01/16/2021   HGBA1C 6.6 (H) 12/18/2019   HGBA1C 6.8 (H) 07/30/2019   HGBA1C 6.5 11/16/2018   HGBA1C 6.2 10/05/2017   Lab Results  Component Value Date   INSULIN 40.8 (H) 02/17/2021   INSULIN 32.2 (H) 01/18/2019   Lab Results  Component Value Date   TSH 2.160 02/17/2021   Lab Results  Component Value Date   CHOL 127 01/16/2021   HDL 27.10 (L) 01/16/2021   LDLCALC 49 07/30/2019   LDLDIRECT 60.0 01/16/2021   TRIG 293.0 (H)  01/16/2021   CHOLHDL 5 01/16/2021   Lab Results  Component Value Date   WBC 10.9 (H) 01/16/2021   HGB 13.8 01/16/2021   HCT 40.8 01/16/2021   MCV 89.3 01/16/2021   PLT 352.0 01/16/2021   No results found for: IRON, TIBC, FERRITIN  Obesity Behavioral Intervention:   Approximately 15 minutes were spent on the discussion below.  ASK: We discussed the diagnosis of obesity with Roy Avila today and Roy Avila  agreed to give [name] permission to discuss obesity behavioral modification therapy today.  ASSESS: Roy Avila has the diagnosis of obesity and his BMI today is 34.4. Roy Avila is in the action stage of change.   ADVISE: Roy Avila was educated on the multiple health risks of obesity as well as the benefit of weight loss to improve his health. He was advised of the need for long term treatment and the importance of lifestyle modifications to improve his current health and to decrease his risk of future health problems.  AGREE: Multiple dietary modification options and treatment options were discussed and Roy Avila agreed to follow the recommendations documented in the above note.  ARRANGE: Roy Avila was educated on the importance of frequent visits to treat obesity as outlined per CMS and USPSTF guidelines and agreed to schedule his next follow up appointment today.  Attestation Statements:   Reviewed by clinician on day of visit: allergies, medications, problem list, medical history, surgical history, family history, social history, and previous encounter notes.  [name], CMA, am acting as Energy manager for [employer], DO.  I have reviewed the above documentation for accuracy and completeness, and I agree with the above. Roy Capra, DO

## 2021-05-20 ENCOUNTER — Other Ambulatory Visit: Payer: Self-pay

## 2021-05-20 ENCOUNTER — Encounter (INDEPENDENT_AMBULATORY_CARE_PROVIDER_SITE_OTHER): Payer: Self-pay | Admitting: Bariatrics

## 2021-05-20 ENCOUNTER — Ambulatory Visit (INDEPENDENT_AMBULATORY_CARE_PROVIDER_SITE_OTHER): Payer: Medicare Other | Admitting: Bariatrics

## 2021-05-20 VITALS — BP 145/81 | HR 71 | Temp 97.8°F | Ht 71.0 in | Wt 240.0 lb

## 2021-05-20 DIAGNOSIS — E1169 Type 2 diabetes mellitus with other specified complication: Secondary | ICD-10-CM | POA: Diagnosis not present

## 2021-05-20 DIAGNOSIS — E559 Vitamin D deficiency, unspecified: Secondary | ICD-10-CM | POA: Diagnosis not present

## 2021-05-20 DIAGNOSIS — E669 Obesity, unspecified: Secondary | ICD-10-CM | POA: Diagnosis not present

## 2021-05-20 DIAGNOSIS — Z6835 Body mass index (BMI) 35.0-35.9, adult: Secondary | ICD-10-CM | POA: Diagnosis not present

## 2021-05-20 MED ORDER — VITAMIN D (ERGOCALCIFEROL) 1.25 MG (50000 UNIT) PO CAPS
50000.0000 [IU] | ORAL_CAPSULE | ORAL | 0 refills | Status: DC
Start: 2021-05-20 — End: 2021-06-10

## 2021-05-27 NOTE — Progress Notes (Signed)
Chief Complaint:   OBESITY Roy Avila is here to discuss his progress with his obesity treatment plan along with follow-up of his obesity related diagnoses. Roy Avila is on the Category 3 Plan and states he is following his eating plan approximately 90% of the time. Roy Avila states he is yard work 4 hours 1 times per week.  Today's visit was #: 5 Starting weight: 258 lbs Starting date: 02/17/2021 Today's weight: 240 lbs Today's date: 05/20/2021 Total lbs lost to date: 18 Total lbs lost since last in-office visit: 7  Interim History: Roy Avila is down an additional 7 lbs and doing well overall. He is eating less due to decreased appetite.  Subjective:   1. Vitamin D deficiency He is currently taking prescription vitamin D 50,000 IU each week. He denies nausea, vomiting or muscle weakness.  Lab Results  Component Value Date   VD25OH 21.7 (L) 02/17/2021   VD25OH 21.9 (L) 01/18/2019   2. Diabetes mellitus type 2 in obese [facility]) Roy Avila is taking Metformin.  Lab Results  Component Value Date   HGBA1C 6.7 (H) 01/16/2021   HGBA1C 6.6 (H) 12/18/2019   HGBA1C 6.8 (H) 07/30/2019   Lab Results  Component Value Date   LDLCALC 49 07/30/2019   CREATININE 0.93 01/16/2021   Lab Results  Component Value Date   INSULIN 40.8 (H) 02/17/2021   INSULIN 32.2 (H) 01/18/2019   Assessment/Plan:   1. Vitamin D deficiency Low Vitamin D level contributes to fatigue and are associated with obesity, breast, and colon cancer. He agrees to continue to take prescription Vitamin D @50 ,000 IU every week and will follow-up for routine testing of Vitamin D, at least 2-3 times per year to avoid over-replacement.  Refill- Vitamin D, Ergocalciferol, (DRISDOL) 1.25 MG (50000 UNIT) CAPS capsule; Take 1 capsule (50,000 Units total) by mouth every 7 (seven) days.  Dispense: 4 capsule; Refill: 0  2. Diabetes mellitus type 2 in obese (HCC) Good blood sugar control is important to decrease the likelihood of  diabetic complications such as nephropathy, neuropathy, limb loss, blindness, coronary artery disease, and death. Intensive lifestyle modification including diet, exercise and weight loss are the first line of treatment for diabetes. Continue Metformin.  3. Obesity, current BMI 33.6  Roy Avila is currently in the action stage of change. As such, his goal is to continue with weight loss efforts. He has agreed to the Category 3 Plan.   Meal plan Intentional eating Keep water intake high  Exercise goals:  As is- yard work and will continue to walk in the park.  Behavioral modification strategies: increasing lean protein intake, decreasing simple carbohydrates, increasing vegetables, increasing water intake, decreasing eating out, no skipping meals, meal planning and cooking strategies, keeping healthy foods in the home, and planning for success.  Roy Avila has agreed to follow-up with our clinic in 2 weeks. He was informed of the importance of frequent follow-up visits to maximize his success with intensive lifestyle modifications for his multiple health conditions.   Objective:   Blood pressure (!) 145/81, pulse 71, temperature 97.8 F (36.6 C), height 5\' 11"  (1.803 m), weight 240 lb (108.9 kg), SpO2 97 %. Body mass index is 33.47 kg/m.  General: Cooperative, alert, well developed, in no acute distress. HEENT: Conjunctivae and lids unremarkable. Cardiovascular: Regular rhythm.  Lungs: Normal work of breathing. Neurologic: No focal deficits.   Lab Results  Component Value Date   CREATININE 0.93 01/16/2021   BUN 13 01/16/2021   NA 139 01/16/2021   K  3.4 (L) 01/16/2021   CL 100 01/16/2021   CO2 31 01/16/2021   Lab Results  Component Value Date   ALT 53 01/16/2021   AST 41 (H) 01/16/2021   ALKPHOS 94 01/16/2021   BILITOT 0.5 01/16/2021   Lab Results  Component Value Date   HGBA1C 6.7 (H) 01/16/2021   HGBA1C 6.6 (H) 12/18/2019   HGBA1C 6.8 (H) 07/30/2019   HGBA1C 6.5 11/16/2018    HGBA1C 6.2 10/05/2017   Lab Results  Component Value Date   INSULIN 40.8 (H) 02/17/2021   INSULIN 32.2 (H) 01/18/2019   Lab Results  Component Value Date   TSH 2.160 02/17/2021   Lab Results  Component Value Date   CHOL 127 01/16/2021   HDL 27.10 (L) 01/16/2021   LDLCALC 49 07/30/2019   LDLDIRECT 60.0 01/16/2021   TRIG 293.0 (H) 01/16/2021   CHOLHDL 5 01/16/2021   Lab Results  Component Value Date   VD25OH 21.7 (L) 02/17/2021   VD25OH 21.9 (L) 01/18/2019   Lab Results  Component Value Date   WBC 10.9 (H) 01/16/2021   HGB 13.8 01/16/2021   HCT 40.8 01/16/2021   MCV 89.3 01/16/2021   PLT 352.0 01/16/2021   No results found for: IRON, TIBC, FERRITIN  Obesity Behavioral Intervention:   Approximately 15 minutes were spent on the discussion below.  ASK: We discussed the diagnosis of obesity with Roy Avila today and Roy Avila agreed to give [name] permission to discuss obesity behavioral modification therapy today.  ASSESS: Roy Avila has the diagnosis of obesity and his BMI today is 33.6. Roy Avila is in the action stage of change.   ADVISE: Roy Avila was educated on the multiple health risks of obesity as well as the benefit of weight loss to improve his health. He was advised of the need for long term treatment and the importance of lifestyle modifications to improve his current health and to decrease his risk of future health problems.  AGREE: Multiple dietary modification options and treatment options were discussed and Roy Avila agreed to follow the recommendations documented in the above note.  ARRANGE: Roy Avila was educated on the importance of frequent visits to treat obesity as outlined per CMS and USPSTF guidelines and agreed to schedule his next follow up appointment today.  Attestation Statements:   Reviewed by clinician on day of visit: allergies, medications, problem list, medical history, surgical history, family history, social history, and previous encounter  notes.  [name], CMA, am acting as Energy manager for [employer], DO.  I have reviewed the above documentation for accuracy and completeness, and I agree with the above. [name], DO

## 2021-06-10 ENCOUNTER — Ambulatory Visit (INDEPENDENT_AMBULATORY_CARE_PROVIDER_SITE_OTHER): Payer: Medicare Other | Admitting: Bariatrics

## 2021-06-10 ENCOUNTER — Other Ambulatory Visit: Payer: Self-pay

## 2021-06-10 ENCOUNTER — Encounter (INDEPENDENT_AMBULATORY_CARE_PROVIDER_SITE_OTHER): Payer: Self-pay | Admitting: Bariatrics

## 2021-06-10 VITALS — BP 138/81 | HR 67 | Temp 97.7°F | Ht 71.0 in | Wt 237.0 lb

## 2021-06-10 DIAGNOSIS — Z6835 Body mass index (BMI) 35.0-35.9, adult: Secondary | ICD-10-CM | POA: Diagnosis not present

## 2021-06-10 DIAGNOSIS — I10 Essential (primary) hypertension: Secondary | ICD-10-CM

## 2021-06-10 DIAGNOSIS — E559 Vitamin D deficiency, unspecified: Secondary | ICD-10-CM | POA: Diagnosis not present

## 2021-06-10 MED ORDER — VITAMIN D (ERGOCALCIFEROL) 1.25 MG (50000 UNIT) PO CAPS
50000.0000 [IU] | ORAL_CAPSULE | ORAL | 0 refills | Status: DC
Start: 2021-06-10 — End: 2021-07-28

## 2021-06-11 NOTE — Progress Notes (Signed)
Chief Complaint:   OBESITY Roy Avila is here to discuss his progress with his obesity treatment plan along with follow-up of his obesity related diagnoses. [name] is on the Category 3 Plan and states he is following his eating plan approximately 75% of the time. [name] states he is walking 1 mile 3 times per week.  Today's visit was #: 6 Starting weight: 258 lbs Starting date: 02/17/2021 Today's weight: 237 lbs Today's date:06/10/2021 Total lbs lost to date: 24 lbs Total lbs lost since last in-office visit: 3 lbs  Interim History: Roy Avila is down an additional 3 lbs and doing well overall. He is drinking more water. He is not as hungry as he used to be.  Subjective:   1. Essential hypertension Chesley's hypertension is controlled with no orthostatic hypertension.  2. Vitamin D deficiency Leotis Shames is taking Vitamin D and denies no muscle weakness, nausea, or vomiting.  Assessment/Plan:   1. Essential hypertension Roy Avila will continue his medication and is working on healthy weight loss and exercise to improve blood pressure control. We will watch for signs of hypotension as he continues his lifestyle modifications.   2. Vitamin D deficiency Low Vitamin D level contributes to fatigue and are associated with obesity, breast, and colon cancer. We will refill Vitamin D for 1 month with no refills.He agrees to continue to take prescription Vitamin D 50,000 IU every week and will follow-up for routine testing of Vitamin D, at least 2-3 times per year to avoid over-replacement.  - Vitamin D, Ergocalciferol, (DRISDOL) 1.25 MG (50000 UNIT) CAPS capsule; Take 1 capsule (50,000 Units total) by mouth every 7 (seven) days.  Dispense: 4 capsule; Refill: 0  3. Obesity, current BMI 33.1 Roy Avila is currently in the action stage of change. As such, his goal is to continue with weight loss efforts. He has agreed to the Category 3 Plan.   Roy Avila will adhere closely to the plan. He will  continue to meal plan. A Smart Fruit Sheet was given today.  Exercise goals:  As is. Golfing and moving more.  Behavioral modification strategies: increasing lean protein intake, decreasing simple carbohydrates, increasing vegetables, increasing water intake, decreasing eating out, no skipping meals, meal planning and cooking strategies, keeping healthy foods in the home, and planning for success.  Roy Avila has agreed to follow-up with our clinic in 3-4 weeks. He was informed of the importance of frequent follow-up visits to maximize his success with intensive lifestyle modifications for his multiple health conditions.   Objective:   Blood pressure 138/81, pulse 67, temperature 97.7 F (36.5 C), height 5\' 11"  (1.803 m), weight 237 lb (107.5 kg), SpO2 96 %. Body mass index is 33.05 kg/m.  General: Cooperative, alert, well developed, in no acute distress. HEENT: Conjunctivae and lids unremarkable. Cardiovascular: Regular rhythm.  Lungs: Normal work of breathing. Neurologic: No focal deficits.   Lab Results  Component Value Date   CREATININE 0.93 01/16/2021   BUN 13 01/16/2021   NA 139 01/16/2021   K 3.4 (L) 01/16/2021   CL 100 01/16/2021   CO2 31 01/16/2021   Lab Results  Component Value Date   ALT 53 01/16/2021   AST 41 (H) 01/16/2021   ALKPHOS 94 01/16/2021   BILITOT 0.5 01/16/2021   Lab Results  Component Value Date   HGBA1C 6.7 (H) 01/16/2021   HGBA1C 6.6 (H) 12/18/2019   HGBA1C 6.8 (H) 07/30/2019   HGBA1C 6.5 11/16/2018   HGBA1C 6.2 10/05/2017   Lab Results  Component Value  Date   INSULIN 40.8 (H) 02/17/2021   INSULIN 32.2 (H) 01/18/2019   Lab Results  Component Value Date   TSH 2.160 02/17/2021   Lab Results  Component Value Date   CHOL 127 01/16/2021   HDL 27.10 (L) 01/16/2021   LDLCALC 49 07/30/2019   LDLDIRECT 60.0 01/16/2021   TRIG 293.0 (H) 01/16/2021   CHOLHDL 5 01/16/2021   Lab Results  Component Value Date   VD25OH 21.7 (L) 02/17/2021    VD25OH 21.9 (L) 01/18/2019   Lab Results  Component Value Date   WBC 10.9 (H) 01/16/2021   HGB 13.8 01/16/2021   HCT 40.8 01/16/2021   MCV 89.3 01/16/2021   PLT 352.0 01/16/2021   No results found for: IRON, TIBC, FERRITIN  Obesity Behavioral Intervention:   Approximately 15 minutes were spent on the discussion below.  ASK: We discussed the diagnosis of obesity with Roy Avila today and Roy Avila agreed to give [name] permission to discuss obesity behavioral modification therapy today.  ASSESS: Roy Avila has the diagnosis of obesity and his BMI today is 33.1. Roy Avila is in the action stage of change.   ADVISE: Roy Avila was educated on the multiple health risks of obesity as well as the benefit of weight loss to improve his health. He was advised of the need for long term treatment and the importance of lifestyle modifications to improve his current health and to decrease his risk of future health problems.  AGREE: Multiple dietary modification options and treatment options were discussed and Roy Avila agreed to follow the recommendations documented in the above note.  ARRANGE: Roy Avila was educated on the importance of frequent visits to treat obesity as outlined per CMS and USPSTF guidelines and agreed to schedule his next follow up appointment today.  Attestation Statements:   Reviewed by clinician on day of visit: allergies, medications, problem list, medical history, surgical history, family history, social history, and previous encounter notes.  I, Jackson Latino, RMA, am acting as Energy manager for [employer], DO.   I have reviewed the above documentation for accuracy and completeness, and I agree with the above. [name], DO

## 2021-06-15 ENCOUNTER — Encounter (INDEPENDENT_AMBULATORY_CARE_PROVIDER_SITE_OTHER): Payer: Self-pay | Admitting: Bariatrics

## 2021-07-28 ENCOUNTER — Ambulatory Visit (INDEPENDENT_AMBULATORY_CARE_PROVIDER_SITE_OTHER): Payer: Medicare Other | Admitting: Bariatrics

## 2021-07-28 ENCOUNTER — Encounter (INDEPENDENT_AMBULATORY_CARE_PROVIDER_SITE_OTHER): Payer: Self-pay | Admitting: Bariatrics

## 2021-07-28 ENCOUNTER — Other Ambulatory Visit: Payer: Self-pay

## 2021-07-28 VITALS — BP 141/70 | HR 67 | Temp 98.6°F | Ht 71.0 in | Wt 236.0 lb

## 2021-07-28 DIAGNOSIS — I10 Essential (primary) hypertension: Secondary | ICD-10-CM

## 2021-07-28 DIAGNOSIS — Z6835 Body mass index (BMI) 35.0-35.9, adult: Secondary | ICD-10-CM

## 2021-07-28 DIAGNOSIS — E559 Vitamin D deficiency, unspecified: Secondary | ICD-10-CM | POA: Diagnosis not present

## 2021-07-28 DIAGNOSIS — E66812 Obesity, class 2: Secondary | ICD-10-CM

## 2021-07-28 MED ORDER — VITAMIN D (ERGOCALCIFEROL) 1.25 MG (50000 UNIT) PO CAPS: 50000.0000 [IU] | ORAL_CAPSULE | ORAL | 0 refills | Status: DC

## 2021-07-28 NOTE — Progress Notes (Signed)
Chief Complaint:   OBESITY Cruzito is here to discuss his progress with his obesity treatment plan along with follow-up of his obesity related diagnoses. Carols is on the Category 3 Plan and states he is following his eating plan approximately 75% of the time. Braycen states he is doing 0 minutes 0 times per week.  Today's visit was #: 7 Starting weight: 258 lbs Starting date: 02/17/2021 Today's weight: 236 lbs Today's date: 07/28/2021 Total lbs lost to date: 22 lbs Total lbs lost since last in-office visit: 1 lb  Interim History: Kieran has been on vacation for 2 weeks, but still managed to lose 1 lb.  Subjective:   1. Vitamin D deficiency Lennis is currently taking his medications as directed.  2. Essential hypertension Joshva's hypertension is well controlled.   Assessment/Plan:   1. Vitamin D deficiency Low Vitamin D level contributes to fatigue and are associated with obesity, breast, and colon cancer. We will refill prescription Vitamin D 50,000 IU every week for 1 month with no refills and Rondarius will follow-up for routine testing of Vitamin D, at least 2-3 times per year to avoid over-replacement.  - Vitamin D, Ergocalciferol, (DRISDOL) 1.25 MG (50000 UNIT) CAPS capsule; Take 1 capsule (50,000 Units total) by mouth every 7 (seven) days.  Dispense: 4 capsule; Refill: 0  2. Essential hypertension Jimmy will continue his medications. He is working on healthy weight loss and exercise to improve blood pressure control. We will watch for signs of hypotension as he continues his lifestyle modifications.   3. Obesity, current BMI 33.0 Elbridge is currently in the action stage of change. As such, his goal is to continue with weight loss efforts. He has agreed to the Category 3 Plan.   Kabe will continue meal planning. He will adhere closely to the plan.  Exercise goals:  Dahir will be taking golf lessons.  Behavioral modification strategies: increasing lean  protein intake, decreasing simple carbohydrates, increasing vegetables, increasing water intake, decreasing eating out, no skipping meals, meal planning and cooking strategies, keeping healthy foods in the home, and planning for success.  Benett has agreed to follow-up with our clinic in 2-3 weeks (fasting). He was informed of the importance of frequent follow-up visits to maximize his success with intensive lifestyle modifications for his multiple health conditions.   Objective:   Blood pressure (!) 141/70, pulse 67, temperature 98.6 F (37 C), height 5\' 11"  (1.803 m), weight 236 lb (107 kg), SpO2 97 %. Body mass index is 32.92 kg/m.  General: Cooperative, alert, well developed, in no acute distress. HEENT: Conjunctivae and lids unremarkable. Cardiovascular: Regular rhythm.  Lungs: Normal work of breathing. Neurologic: No focal deficits.   Lab Results  Component Value Date   CREATININE 0.93 01/16/2021   BUN 13 01/16/2021   NA 139 01/16/2021   K 3.4 (L) 01/16/2021   CL 100 01/16/2021   CO2 31 01/16/2021   Lab Results  Component Value Date   ALT 53 01/16/2021   AST 41 (H) 01/16/2021   ALKPHOS 94 01/16/2021   BILITOT 0.5 01/16/2021   Lab Results  Component Value Date   HGBA1C 6.7 (H) 01/16/2021   HGBA1C 6.6 (H) 12/18/2019   HGBA1C 6.8 (H) 07/30/2019   HGBA1C 6.5 11/16/2018   HGBA1C 6.2 10/05/2017   Lab Results  Component Value Date   INSULIN 40.8 (H) 02/17/2021   INSULIN 32.2 (H) 01/18/2019   Lab Results  Component Value Date   TSH 2.160 02/17/2021   Lab  Results  Component Value Date   CHOL 127 01/16/2021   HDL 27.10 (L) 01/16/2021   LDLCALC 49 07/30/2019   LDLDIRECT 60.0 01/16/2021   TRIG 293.0 (H) 01/16/2021   CHOLHDL 5 01/16/2021   Lab Results  Component Value Date   VD25OH 21.7 (L) 02/17/2021   VD25OH 21.9 (L) 01/18/2019   Lab Results  Component Value Date   WBC 10.9 (H) 01/16/2021   HGB 13.8 01/16/2021   HCT 40.8 01/16/2021   MCV 89.3  01/16/2021   PLT 352.0 01/16/2021   No results found for: IRON, TIBC, FERRITIN  Obesity Behavioral Intervention:   Approximately 15 minutes were spent on the discussion below.  ASK: We discussed the diagnosis of obesity with [name] today and Adrick agreed to give [name] permission to discuss obesity behavioral modification therapy today.  ASSESS: Spiros has the diagnosis of obesity and his BMI today is 33.0. Omarr is in the action stage of change.   ADVISE: Taedyn was educated on the multiple health risks of obesity as well as the benefit of weight loss to improve his health. He was advised of the need for long term treatment and the importance of lifestyle modifications to improve his current health and to decrease his risk of future health problems.  AGREE: Multiple dietary modification options and treatment options were discussed and Othel agreed to follow the recommendations documented in the above note.  ARRANGE: Daiveon was educated on the importance of frequent visits to treat obesity as outlined per CMS and USPSTF guidelines and agreed to schedule his next follow up appointment today.  Attestation Statements:   Reviewed by clinician on day of visit: allergies, medications, problem list, medical history, surgical history, family history, social history, and previous encounter notes.  I, Jackson Latino, RMA, am acting as Energy manager for [employer], DO.   I have reviewed the above documentation for accuracy and completeness, and I agree with the above. [name], DO

## 2021-08-11 ENCOUNTER — Other Ambulatory Visit: Payer: Self-pay | Admitting: Family Medicine

## 2021-08-11 DIAGNOSIS — I10 Essential (primary) hypertension: Secondary | ICD-10-CM

## 2021-08-20 ENCOUNTER — Other Ambulatory Visit: Payer: Self-pay

## 2021-08-20 ENCOUNTER — Encounter (INDEPENDENT_AMBULATORY_CARE_PROVIDER_SITE_OTHER): Payer: Self-pay | Admitting: Bariatrics

## 2021-08-20 ENCOUNTER — Ambulatory Visit (INDEPENDENT_AMBULATORY_CARE_PROVIDER_SITE_OTHER): Payer: Medicare Other | Admitting: Bariatrics

## 2021-08-20 VITALS — BP 127/72 | HR 80 | Temp 97.8°F | Ht 71.0 in | Wt 232.0 lb

## 2021-08-20 DIAGNOSIS — E876 Hypokalemia: Secondary | ICD-10-CM | POA: Diagnosis not present

## 2021-08-20 DIAGNOSIS — E559 Vitamin D deficiency, unspecified: Secondary | ICD-10-CM | POA: Diagnosis not present

## 2021-08-20 DIAGNOSIS — E1169 Type 2 diabetes mellitus with other specified complication: Secondary | ICD-10-CM | POA: Diagnosis not present

## 2021-08-20 DIAGNOSIS — E786 Lipoprotein deficiency: Secondary | ICD-10-CM

## 2021-08-20 DIAGNOSIS — E785 Hyperlipidemia, unspecified: Secondary | ICD-10-CM

## 2021-08-20 DIAGNOSIS — Z6835 Body mass index (BMI) 35.0-35.9, adult: Secondary | ICD-10-CM | POA: Diagnosis not present

## 2021-08-20 DIAGNOSIS — E669 Obesity, unspecified: Secondary | ICD-10-CM

## 2021-08-20 MED ORDER — VITAMIN D (ERGOCALCIFEROL) 1.25 MG (50000 UNIT) PO CAPS: 50000.0000 [IU] | ORAL_CAPSULE | ORAL | 0 refills | Status: DC

## 2021-08-20 NOTE — Progress Notes (Signed)
Chief Complaint:   OBESITY Roy Avila is here to discuss his progress with his obesity treatment plan along with follow-up of his obesity related diagnoses. Roy Avila is on the Category 3 Plan and states he is following his eating plan approximately 80% of the time. [name] states he is walking 2 miles 3 times per week.  Today's visit was #: 8 Starting weight: 258 lbs Starting date: 02/17/2021 Today's weight: 232 lbs Today's date: 08/20/2021 Total lbs lost to date: 26 lbs Total lbs lost since last in-office visit: 4 lbs  Interim History: Roy Avila is down another 4 lbs and doing well.  Subjective:   1. Vitamin D deficiency Roy Avila is taking his medications as directed.  2. Low HDL (under 40) Roy Avila is taking Lipitor as directed.  3. Hypokalemia Roy Avila's potassium was decreased at last office visit.  4. Diabetes mellitus type 2 in obese (HCC) Roy Avila is taking Metformin.  5. Dyslipidemia Roy Avila is taking Lipitor.  Assessment/Plan:   1. Vitamin D deficiency Low Vitamin D level contributes to fatigue and are associated with obesity, breast, and colon cancer. We will refill prescription Vitamin D 50,000 IU every week for 1 month with no refills and he will follow-up for routine testing of Vitamin D, at least 2-3 times per year to avoid over-replacement.  - Vitamin D, Ergocalciferol, (DRISDOL) 1.25 MG (50000 UNIT) CAPS capsule; Take 1 capsule (50,000 Units total) by mouth every 7 (seven) days.  Dispense: 4 capsule; Refill: 0 - VITAMIN D 25 Hydroxy (Vit-D Deficiency, Fractures)  2. Low HDL (under 40) Cardiovascular risk and specific lipid/LDL goals reviewed.  We discussed several lifestyle modifications today and Roy Avila will continue to work on diet, exercise and weight loss efforts. We will check Lipid panel today. Orders and follow up as documented in patient record.   Counseling Intensive lifestyle modifications are the first line treatment for this issue. Dietary  changes: Increase soluble fiber. Decrease simple carbohydrates. Exercise changes: Moderate to vigorous-intensity aerobic activity 150 minutes per week if tolerated. Lipid-lowering medications: see documented in medical record.  - Lipid Panel With LDL/HDL Ratio  3. Hypokalemia Orders and follow up as documented in patient record.We will check CMP today.   Counseling Iron is essential for our bodies to make red blood cells.  Reasons that someone may be deficient include: an iron-deficient diet (more likely in those following vegan or vegetarian diets), women with heavy menses, patients with GI disorders or poor absorption, patients that have had bariatric surgery, frequent blood donors, patients with cancer, and patients with heart disease.   An iron supplement has been recommended. This is found over-the-counter.  Iron-rich foods include dark leafy greens, red and white meats, eggs, seafood, and beans.   Certain foods and drinks prevent your body from absorbing iron properly. Avoid eating these foods in the same meal as iron-rich foods or with iron supplements. These foods include: coffee, black tea, and red wine; milk, dairy products, and foods that are high in calcium; beans and soybeans; whole grains.  Constipation can be a side effect of iron supplementation. Increased water and fiber intake are helpful. Water goal: > 2 liters/day. Fiber goal: > 25 grams/day.   4. Diabetes mellitus type 2 in obese (HCC) Good blood sugar control is important to decrease the likelihood of diabetic complications such as nephropathy, neuropathy, limb loss, blindness, coronary artery disease, and death. We will check Insulin and A1C levels today. Intensive lifestyle modification including diet, exercise and weight loss are the first line  of treatment for diabetes.   - Hemoglobin A1c - Insulin, random - Comprehensive metabolic panel  5. Dyslipidemia Cardiovascular risk and specific lipid/LDL goals reviewed.  We  discussed several lifestyle modifications today and Roy Avila will continue to work on diet, exercise and weight loss efforts. We will check Lipids panel today. Orders and follow up as documented in patient record.   Counseling Intensive lifestyle modifications are the first line treatment for this issue. Dietary changes: Increase soluble fiber. Decrease simple carbohydrates. Exercise changes: Moderate to vigorous-intensity aerobic activity 150 minutes per week if tolerated. Lipid-lowering medications: see documented in medical record.  - Lipid Panel With LDL/HDL Ratio - Comprehensive metabolic panel  6. Obesity, current BMI 32.4 Roy Avila is currently in the action stage of change. As such, his goal is to continue with weight loss efforts. He has agreed to the Category 3 Plan.   Roy Avila will continue meal planning. He will continue to adhere to the plan.  Exercise goals:  As is.  Behavioral modification strategies: increasing lean protein intake, decreasing simple carbohydrates, increasing vegetables, increasing water intake, decreasing eating out, no skipping meals, meal planning and cooking strategies, keeping healthy foods in the home, and planning for success.  Roy Avila has agreed to follow-up with our clinic in 3 weeks. He was informed of the importance of frequent follow-up visits to maximize his success with intensive lifestyle modifications for his multiple health conditions.   Objective:   Blood pressure 127/72, pulse 80, temperature 97.8 F (36.6 C), height 5\' 11"  (1.803 m), weight 232 lb (105.2 kg), SpO2 96 %. Body mass index is 32.36 kg/m.  General: Cooperative, alert, well developed, in no acute distress. HEENT: Conjunctivae and lids unremarkable. Cardiovascular: Regular rhythm.  Lungs: Normal work of breathing. Neurologic: No focal deficits.   Lab Results  Component Value Date   CREATININE 0.93 01/16/2021   BUN 13 01/16/2021   NA 139 01/16/2021   K 3.4 (L) 01/16/2021    CL 100 01/16/2021   CO2 31 01/16/2021   Lab Results  Component Value Date   ALT 53 01/16/2021   AST 41 (H) 01/16/2021   ALKPHOS 94 01/16/2021   BILITOT 0.5 01/16/2021   Lab Results  Component Value Date   HGBA1C 6.7 (H) 01/16/2021   HGBA1C 6.6 (H) 12/18/2019   HGBA1C 6.8 (H) 07/30/2019   HGBA1C 6.5 11/16/2018   HGBA1C 6.2 10/05/2017   Lab Results  Component Value Date   INSULIN 40.8 (H) 02/17/2021   INSULIN 32.2 (H) 01/18/2019   Lab Results  Component Value Date   TSH 2.160 02/17/2021   Lab Results  Component Value Date   CHOL 127 01/16/2021   HDL 27.10 (L) 01/16/2021   LDLCALC 49 07/30/2019   LDLDIRECT 60.0 01/16/2021   TRIG 293.0 (H) 01/16/2021   CHOLHDL 5 01/16/2021   Lab Results  Component Value Date   VD25OH 21.7 (L) 02/17/2021   VD25OH 21.9 (L) 01/18/2019   Lab Results  Component Value Date   WBC 10.9 (H) 01/16/2021   HGB 13.8 01/16/2021   HCT 40.8 01/16/2021   MCV 89.3 01/16/2021   PLT 352.0 01/16/2021   No results found for: IRON, TIBC, FERRITIN  Obesity Behavioral Intervention:   Approximately 15 minutes were spent on the discussion below.  ASK: We discussed the diagnosis of obesity with 01/18/2021 today and [place] agreed to give Roy Avila permission to discuss obesity behavioral modification therapy today.  ASSESS: Roy Avila has the diagnosis of obesity and his BMI today is 32.4. Roy Avila  is in the action stage of change.   ADVISE: Roy Avila was educated on the multiple health risks of obesity as well as the benefit of weight loss to improve his health. He was advised of the need for long term treatment and the importance of lifestyle modifications to improve his current health and to decrease his risk of future health problems.  AGREE: Multiple dietary modification options and treatment options were discussed and Roy Avila agreed to follow the recommendations documented in the above note.  ARRANGE: Roy Avila was educated on the importance of frequent  visits to treat obesity as outlined per CMS and USPSTF guidelines and agreed to schedule his next follow up appointment today.  Attestation Statements:   Reviewed by clinician on day of visit: allergies, medications, problem list, medical history, surgical history, family history, social history, and previous encounter notes.  I, Jackson Latino, RMA, am acting as Energy manager for [employer], DO.   I have reviewed the above documentation for accuracy and completeness, and I agree with the above. [name], DO

## 2021-08-21 LAB — COMPREHENSIVE METABOLIC PANEL
ALT: 20 IU/L (ref 0–44)
AST: 17 IU/L (ref 0–40)
Albumin/Globulin Ratio: 1.3 (ref 1.2–2.2)
Albumin: 4.3 g/dL (ref 3.8–4.8)
Alkaline Phosphatase: 126 IU/L — ABNORMAL HIGH (ref 44–121)
BUN/Creatinine Ratio: 14 (ref 10–24)
BUN: 12 mg/dL (ref 8–27)
Bilirubin Total: 0.6 mg/dL (ref 0.0–1.2)
CO2: 26 mmol/L (ref 20–29)
Calcium: 9.4 mg/dL (ref 8.6–10.2)
Chloride: 100 mmol/L (ref 96–106)
Creatinine, Ser: 0.85 mg/dL (ref 0.76–1.27)
Globulin, Total: 3.3 g/dL (ref 1.5–4.5)
Glucose: 115 mg/dL — ABNORMAL HIGH (ref 65–99)
Potassium: 3.7 mmol/L (ref 3.5–5.2)
Sodium: 142 mmol/L (ref 134–144)
Total Protein: 7.6 g/dL (ref 6.0–8.5)
eGFR: 94 mL/min/{1.73_m2} (ref >59)

## 2021-08-21 LAB — LIPID PANEL WITH LDL/HDL RATIO
Cholesterol, Total: 132 mg/dL (ref 100–199)
HDL: 28 mg/dL — ABNORMAL LOW (ref >39)
LDL Chol Calc (NIH): 79 mg/dL (ref 0–99)
LDL/HDL Ratio: 2.8 ratio (ref 0.0–3.6)
Triglycerides: 140 mg/dL (ref 0–149)
VLDL Cholesterol Cal: 25 mg/dL (ref 5–40)

## 2021-08-21 LAB — HEMOGLOBIN A1C
Est. average glucose Bld gHb Est-mCnc: 137 mg/dL
Hgb A1c MFr Bld: 6.4 % — ABNORMAL HIGH (ref 4.8–5.6)

## 2021-08-21 LAB — VITAMIN D 25 HYDROXY (VIT D DEFICIENCY, FRACTURES): Vit D, 25-Hydroxy: 46.1 ng/mL (ref 30.0–100.0)

## 2021-08-21 LAB — INSULIN, RANDOM: INSULIN: 30.3 u[IU]/mL — ABNORMAL HIGH (ref 2.6–24.9)

## 2021-10-01 ENCOUNTER — Ambulatory Visit (INDEPENDENT_AMBULATORY_CARE_PROVIDER_SITE_OTHER): Payer: Medicare Other | Admitting: Bariatrics

## 2021-10-08 ENCOUNTER — Ambulatory Visit (INDEPENDENT_AMBULATORY_CARE_PROVIDER_SITE_OTHER): Payer: Medicare Other | Admitting: Bariatrics

## 2021-10-08 ENCOUNTER — Encounter (INDEPENDENT_AMBULATORY_CARE_PROVIDER_SITE_OTHER): Payer: Self-pay | Admitting: Bariatrics

## 2021-10-08 ENCOUNTER — Other Ambulatory Visit: Payer: Self-pay

## 2021-10-08 VITALS — BP 148/82 | HR 71 | Temp 97.7°F | Ht 71.0 in | Wt 238.0 lb

## 2021-10-08 DIAGNOSIS — E1169 Type 2 diabetes mellitus with other specified complication: Secondary | ICD-10-CM

## 2021-10-08 DIAGNOSIS — Z6835 Body mass index (BMI) 35.0-35.9, adult: Secondary | ICD-10-CM

## 2021-10-08 DIAGNOSIS — E669 Obesity, unspecified: Secondary | ICD-10-CM | POA: Diagnosis not present

## 2021-10-08 DIAGNOSIS — E559 Vitamin D deficiency, unspecified: Secondary | ICD-10-CM | POA: Diagnosis not present

## 2021-10-08 MED ORDER — VITAMIN D (ERGOCALCIFEROL) 1.25 MG (50000 UNIT) PO CAPS: 50000.0000 [IU] | ORAL_CAPSULE | ORAL | 0 refills | Status: DC

## 2021-10-08 NOTE — Progress Notes (Signed)
Chief Complaint:   OBESITY Roy Avila is here to discuss his progress with his obesity treatment plan along with follow-up of his obesity related diagnoses. Roy Avila is on the Category 3 Plan and states he is following his eating plan approximately 80% of the time. Roy Avila states he is doing 0 minutes 0 times per week.  Today's visit was #: 9 Starting weight: 258 lbs Starting date: 02/17/2021 Today's weight: 238 lbs Today's date: 10/08/2021 Total lbs lost to date: 20 lbs Total lbs lost since last in-office visit: 0  Interim History: Roy Avila is up 6 lbs but has done well overall. He had multiple stressors. He has had more energy.   Subjective:   1. Vitamin D deficiency Roy Avila is currently taking prescription Vitamin D. His last Vitamin D level is 46.1.  2. Diabetes mellitus type 2 in obese Roy Avila is taking Metformin currently. His last A1C level was 6.4. His last Insulin level was 30.3.  Assessment/Plan:   1. Vitamin D deficiency Low Vitamin D level contributes to fatigue and are associated with obesity, breast, and colon cancer. We will refill prescription Vitamin D 50,000 IU every other week with no refills and Roy Avila will follow-up for routine testing of Vitamin D, at least 2-3 times per year to avoid over-replacement.  - Vitamin D, Ergocalciferol, (DRISDOL) 1.25 MG (50000 UNIT) CAPS capsule; Take 1 capsule (50,000 Units total) by mouth every 14 (fourteen) days.  Dispense: 6 capsule; Refill: 0  2. Diabetes mellitus type 2 in obese Roy Avila will continue Metformin. Good blood sugar control is important to decrease the likelihood of diabetic complications such as nephropathy, neuropathy, limb loss, blindness, coronary artery disease, and death. Intensive lifestyle modification including diet, exercise and weight loss are the first line of treatment for diabetes.   3. Obesity, current BMI 33.2 Roy Avila is currently in the action stage of change. As such, his goal is  to continue with weight loss efforts. He has agreed to the Category 3 Plan.   Roy Avila will continue meal planning and intentional eating. We reviewed labs from 08/20/2021 CMP, Lipid, Vitamin D, Insulin and A1C.  Exercise goals:  Roy Avila will begin to exercise.  Behavioral modification strategies: increasing lean protein intake, decreasing simple carbohydrates, increasing vegetables, increasing water intake, decreasing eating out, no skipping meals, meal planning and cooking strategies, keeping healthy foods in the home, and planning for success.  Roy Avila has agreed to follow-up with our clinic in 2 weeks. He was informed of the importance of frequent follow-up visits to maximize his success with intensive lifestyle modifications for his multiple health conditions.   Objective:   Blood pressure (!) 148/82, pulse 71, temperature 97.7 F (36.5 C), height 5\' 11"  (1.803 m), weight 238 lb (108 kg), SpO2 97 %. Body mass index is 33.19 kg/m.  General: Cooperative, alert, well developed, in no acute distress. HEENT: Conjunctivae and lids unremarkable. Cardiovascular: Regular rhythm.  Lungs: Normal work of breathing. Neurologic: No focal deficits.   Lab Results  Component Value Date   CREATININE 0.85 08/20/2021   BUN 12 08/20/2021   NA 142 08/20/2021   K 3.7 08/20/2021   CL 100 08/20/2021   CO2 26 08/20/2021   Lab Results  Component Value Date   ALT 20 08/20/2021   AST 17 08/20/2021   ALKPHOS 126 (H) 08/20/2021   BILITOT 0.6 08/20/2021   Lab Results  Component Value Date   HGBA1C 6.4 (H) 08/20/2021   HGBA1C 6.7 (H) 01/16/2021   HGBA1C 6.6 (  H) 12/18/2019   HGBA1C 6.8 (H) 07/30/2019   HGBA1C 6.5 11/16/2018   Lab Results  Component Value Date   INSULIN 30.3 (H) 08/20/2021   INSULIN 40.8 (H) 02/17/2021   INSULIN 32.2 (H) 01/18/2019   Lab Results  Component Value Date   TSH 2.160 02/17/2021   Lab Results  Component Value Date   CHOL 132 08/20/2021   HDL 28 (L) 08/20/2021    LDLCALC 79 08/20/2021   LDLDIRECT 60.0 01/16/2021   TRIG 140 08/20/2021   CHOLHDL 5 01/16/2021   Lab Results  Component Value Date   VD25OH 46.1 08/20/2021   VD25OH 21.7 (L) 02/17/2021   VD25OH 21.9 (L) 01/18/2019   Lab Results  Component Value Date   WBC 10.9 (H) 01/16/2021   HGB 13.8 01/16/2021   HCT 40.8 01/16/2021   MCV 89.3 01/16/2021   PLT 352.0 01/16/2021   No results found for: IRON, TIBC, FERRITIN  Attestation Statements:   Reviewed by clinician on day of visit: allergies, medications, problem list, medical history, surgical history, family history, social history, and previous encounter notes.  I, Jackson Latino, RMA, am acting as Energy manager for [employer], DO.   I have reviewed the above documentation for accuracy and completeness, and I agree with the above. [name], DO

## 2021-10-09 ENCOUNTER — Encounter (INDEPENDENT_AMBULATORY_CARE_PROVIDER_SITE_OTHER): Payer: Self-pay | Admitting: Bariatrics

## 2021-11-06 ENCOUNTER — Telehealth (INDEPENDENT_AMBULATORY_CARE_PROVIDER_SITE_OTHER): Payer: Medicare Other | Admitting: Family

## 2021-11-06 ENCOUNTER — Encounter: Payer: Self-pay | Admitting: Family

## 2021-11-06 VITALS — Ht 71.0 in | Wt 238.1 lb

## 2021-11-06 DIAGNOSIS — U071 COVID-19: Secondary | ICD-10-CM | POA: Diagnosis not present

## 2021-11-06 MED ORDER — MOLNUPIRAVIR EUA 200MG CAPSULE: 4.0000 | ORAL_CAPSULE | Freq: Two times a day (BID) | ORAL | 0 refills | Status: AC

## 2021-11-06 NOTE — Assessment & Plan Note (Signed)
Sending Molnupiravir, advised on use, SE, experimental treatment.ok to continue OTC sinus meds. Advised of CDC guidelines for self isolation/ ending isolation.  Advised of safe practice guidelines. Symptom Tier reviewed.  Encouraged to monitor for any worsening symptoms; watch for increased shortness of breath, weakness, and signs of dehydration. Advised when to seek emergency care.  Instructed to rest and hydrate well.  Advised to leave the house during recommended isolation period, only if it is necessary to seek medical care

## 2021-11-06 NOTE — Progress Notes (Signed)
MyChart Video Visit    Virtual Visit via Video Note   This visit type was conducted due to national recommendations for restrictions regarding the COVID-19 Pandemic (e.g. social distancing) in an effort to limit this patient's exposure and mitigate transmission in our community. This patient is at least at moderate risk for complications without adequate follow up. This format is felt to be most appropriate for this patient at this time. Physical exam was limited by quality of the video and audio technology used for the visit. CMA was able to get the patient set up on a video visit.  Patient location: Home. Patient and provider in visit Provider location: Office  I discussed the limitations of evaluation and management by telemedicine and the availability of in person appointments. The patient expressed understanding and agreed to proceed.  Visit Date: 11/06/2021  Today's healthcare provider: Dulce Sellar, NP     Subjective:    Patient ID: Roy Avila, male    DOB: 04/25/1951, 70 y.o.   MRN: [id number]  Chief Complaint  Patient presents with   Covid Positive    Pt states that he had a party last Saturday. Wife is positive as well. Symptoms started Tuesday. Tested positive yesterday. He has not taken anything for symptoms.   Sore Throat   Cough   Generalized Body Aches    HPI: Upper Respiratory Infection: Symptoms include achiness, congestion, non productive cough, and sore throat.  Onset of symptoms was 3 days ago, gradually worsening since that time. He is drinking moderate amounts of fluids. Evaluation to date: none.  Treatment to date: none.    Past Medical History:  Diagnosis Date   Abnormal LFTs (liver function tests), monitoring, likely due to fatty liver, may need [name] 01/27/2017   Anxiety    Back pain    Chronic otitis media after insertion of tympanic ventilation tube, right 07/13/2018   Depression    GERD (gastroesophageal reflux disease)    High blood  sugar    Hyperlipidemia    Hypertension    Obstructive sleep apnea syndrome 01/22/2017   Prediabetes    Sleep apnea    Swallowing difficulty     Past Surgical History:  Procedure Laterality Date   HERNIA REPAIR      Outpatient Medications Prior to Visit  Medication Sig Dispense Refill   amLODipine (NORVASC) 5 MG tablet TAKE 1 TABLET DAILY (DUE FOR APPOINTMENT) 90 tablet 3   atorvastatin (LIPITOR) 20 MG tablet TAKE 1 TABLET DAILY (DUE FOR APPOINTMENT) 90 tablet 3   esomeprazole (NEXIUM) 40 MG capsule TAKE 1 CAPSULE DAILY 90 capsule 3   isotretinoin (ACCUTANE) 10 MG capsule Take 30 mg by mouth daily.     metFORMIN (GLUCOPHAGE-XR) 500 MG 24 hr tablet Take 1 tablet (500 mg total) by mouth at bedtime. 90 tablet 3   olmesartan-hydrochlorothiazide (BENICAR HCT) 40-25 MG tablet Take 1 tablet by mouth daily. 90 tablet 3   Vitamin D, Ergocalciferol, (DRISDOL) 1.25 MG (50000 UNIT) CAPS capsule Take 1 capsule (50,000 Units total) by mouth every 14 (fourteen) days. 6 capsule 0   doxycycline (VIBRA-TABS) 100 MG tablet Take 100 mg by mouth daily.     No facility-administered medications prior to visit.    Allergies  Allergen Reactions   Clarithromycin Rash        Objective:     Physical Exam Vitals and nursing note reviewed.  Constitutional:      General: She is not in acute distress.    Appearance:  Normal appearance.  HENT:     Head: Normocephalic.  Pulmonary:     Effort: No respiratory distress.  Musculoskeletal:     Cervical back: Normal range of motion.  Skin:    General: Skin is dry.     Coloration: Skin is not pale.  Neurological:     Mental Status: She is alert and oriented to person, place, and time.  Psychiatric:        Mood and Affect: Mood normal.   Ht 5\' 11"  (1.803 m)   Wt 238 lb 1.6 oz (108 kg)   BMI 33.21 kg/m   Wt Readings from Last 3 Encounters:  11/06/21 238 lb 1.6 oz (108 kg)  10/08/21 238 lb (108 kg)  08/20/21 232 lb (105.2 kg)       Assessment  & Plan:   Problem List Items Addressed This Visit       Other   COVID-19 - Primary    Sending Molnupiravir, advised on use, SE, experimental treatment.ok to continue OTC sinus meds. Advised of CDC guidelines for self isolation/ ending isolation.  Advised of safe practice guidelines. Symptom Tier reviewed.  Encouraged to monitor for any worsening symptoms; watch for increased shortness of breath, weakness, and signs of dehydration. Advised when to seek emergency care.  Instructed to rest and hydrate well.  Advised to leave the house during recommended isolation period, only if it is necessary to seek medical care       Relevant Medications   molnupiravir EUA (LAGEVRIO) 200 mg CAPS capsule    Meds ordered this encounter  Medications   molnupiravir EUA (LAGEVRIO) 200 mg CAPS capsule    Sig: Take 4 capsules (800 mg total) by mouth 2 (two) times daily for 5 days.    Dispense:  40 capsule    Refill:  0    Order Specific Question:   Supervising Provider    Answer:   ANDY, CAMILLE L [[id number]]    I discussed the assessment and treatment plan with the patient. The patient was provided an opportunity to ask questions and all were answered. The patient agreed with the plan and demonstrated an understanding of the instructions.   The patient was advised to call back or seek an in-person evaluation if the symptoms worsen or if the condition fails to improve as anticipated.  I provided 22 minutes of face-to-face time during this encounter.   08/22/21, NP [facility] [place] [phone] (phone) [phone] (fax)  [facility]

## 2021-11-17 ENCOUNTER — Ambulatory Visit (INDEPENDENT_AMBULATORY_CARE_PROVIDER_SITE_OTHER): Payer: Medicare Other | Admitting: Bariatrics

## 2021-12-16 LAB — HM COLONOSCOPY

## 2021-12-17 ENCOUNTER — Encounter: Payer: Self-pay | Admitting: Family Medicine

## 2021-12-17 LAB — HM COLONOSCOPY

## 2021-12-22 ENCOUNTER — Encounter: Payer: Self-pay | Admitting: Family Medicine

## 2021-12-22 ENCOUNTER — Other Ambulatory Visit: Payer: Self-pay

## 2021-12-22 ENCOUNTER — Ambulatory Visit: Payer: Medicare Other | Admitting: Family Medicine

## 2021-12-22 VITALS — BP 150/78 | HR 66 | Wt 244.0 lb

## 2021-12-22 DIAGNOSIS — E785 Hyperlipidemia, unspecified: Secondary | ICD-10-CM

## 2021-12-22 DIAGNOSIS — I10 Essential (primary) hypertension: Secondary | ICD-10-CM | POA: Diagnosis not present

## 2021-12-22 DIAGNOSIS — M545 Low back pain, unspecified: Secondary | ICD-10-CM

## 2021-12-22 LAB — URINALYSIS, ROUTINE W REFLEX MICROSCOPIC
Bilirubin Urine: NEGATIVE
Hgb urine dipstick: NEGATIVE
Ketones, ur: NEGATIVE
Leukocytes,Ua: NEGATIVE
Nitrite: NEGATIVE
RBC / HPF: NONE SEEN (ref 0–?)
Specific Gravity, Urine: 1.02 (ref 1.000–1.030)
Urine Glucose: NEGATIVE
Urobilinogen, UA: 0.2 (ref 0.0–1.0)
pH: 7 (ref 5.0–8.0)

## 2021-12-22 MED ORDER — CYCLOBENZAPRINE HCL 10 MG PO TABS: 10.0000 mg | ORAL_TABLET | Freq: Three times a day (TID) | ORAL | 0 refills | Status: DC | PRN

## 2021-12-22 MED ORDER — MELOXICAM 15 MG PO TABS: 15.0000 mg | ORAL_TABLET | Freq: Every day | ORAL | 0 refills | Status: DC

## 2021-12-22 NOTE — Assessment & Plan Note (Signed)
Continue Lipitor 40 mg daily.  Check lipids next blood draw.

## 2021-12-22 NOTE — Assessment & Plan Note (Signed)
Slightly above goal in setting of acute pain.  Typically well controlled.  We will continue Norvasc 5 mg daily and olmesartan-HCTZ 40-25 once daily.

## 2021-12-22 NOTE — Progress Notes (Signed)
° °  Roy Avila is a 71 y.o. male who presents today for an office visit.  Assessment/Plan:  New/Acute Problems: Back Pain No red flags.  Likely muscular strain.  We will start Flexeril and meloxicam.  Discussed home exercises and handout was given.  We will check UA to rule out nephrolithiasis per patient request.  Discussed reasons to return to care.  If symptoms worsen or do not improve would consider imaging or referral to PT or sports med.  Chronic Problems Addressed Today: Dyslipidemia Continue Lipitor 40 mg daily.  Check lipids next blood draw.  Essential hypertension Slightly above goal in setting of acute pain.  Typically well controlled.  We will continue Norvasc 5 mg daily and olmesartan-HCTZ 40-25 once daily.     Subjective:  HPI:  Patient here with back pain. This has been going on for a week.  Located on the right lower back. Certain motion make it worse. He tried ibuprofen and Tynelol with some relief. He is not sure if this has helped. Pain seem to be improving.  He notes pain is present whenever he cough or sneeze. No injury or precipating factors. No fever, nausea or chills. Denies hematuria or dysuria.   See A/p for status of chronic conditions.         Objective:  Physical Exam: BP (!) 150/78 (BP Location: Right Arm, Patient Position: Sitting)    Pulse 66    Wt 244 lb (110.7 kg)    SpO2 95%    BMI 34.03 kg/m   Gen: No acute distress, resting comfortably CV: Regular rate and rhythm with no murmurs appreciated Pulm: Normal work of breathing, clear to auscultation bilaterally with no crackles, wheezes, or rhonchi MSK: Tenderness to  palpation along right lower lumbar paraspinal muscles.  Neurovascular intact distally. Neuro: Grossly normal, moves all extremities Psych: Normal affect and thought content       I,Savera Zaman,acting as a scribe for Dimas Chyle, MD.,have documented all relevant documentation on the behalf of Dimas Chyle, MD,as directed by   Dimas Chyle, MD while in the presence of Dimas Chyle, MD.   I, Dimas Chyle, MD, have reviewed all documentation for this visit. The documentation on 12/22/21 for the exam, diagnosis, procedures, and orders are all accurate and complete.  Algis Greenhouse. Jerline Pain, MD 12/22/2021 10:59 AM

## 2021-12-22 NOTE — Patient Instructions (Signed)
It was very nice to see you today!  I think you have a muscle strain in your back.  Please start the meloxicam and Flexeril.  Please do this for the next 5 to 7 days.  Please work on the exercises.  Let me know if not improving.  We will check a urine sample to make sure there is nothing else going on.  Take care, Dr Jimmey Ralph  PLEASE NOTE:  If you had any lab tests please let us know if you have not heard back within a few days. You may see your results on mychart before we have a chance to review them but we will give you a call once they are reviewed by [name]. If we ordered any referrals today, please let us know if you have not heard from their office within the next week.   Please try these tips to maintain a healthy lifestyle:  Eat at least 3 REAL meals and 1-2 snacks per day.  Aim for no more than 5 hours between eating.  If you eat breakfast, please do so within one hour of getting up.   Each meal should contain half fruits/vegetables, one quarter protein, and one quarter carbs (no bigger than a computer mouse)  Cut down on sweet beverages. This includes juice, soda, and sweet tea.   Drink at least 1 glass of water with each meal and aim for at least 8 glasses per day  Exercise at least 150 minutes every week.

## 2021-12-30 ENCOUNTER — Encounter: Payer: Self-pay | Admitting: Family Medicine

## 2022-01-18 ENCOUNTER — Other Ambulatory Visit: Payer: Self-pay | Admitting: Family Medicine

## 2022-04-12 ENCOUNTER — Other Ambulatory Visit: Payer: Self-pay | Admitting: Family Medicine

## 2022-04-12 DIAGNOSIS — I10 Essential (primary) hypertension: Secondary | ICD-10-CM

## 2022-04-14 ENCOUNTER — Other Ambulatory Visit: Payer: Self-pay | Admitting: Family Medicine

## 2022-04-14 MED ORDER — CYCLOBENZAPRINE HCL 10 MG PO TABS
10.0000 mg | ORAL_TABLET | Freq: Three times a day (TID) | ORAL | 0 refills | Status: DC | PRN
Start: 2022-04-14 — End: 2022-05-10

## 2022-04-14 NOTE — Telephone Encounter (Signed)
Rx sent to pharmacy per Jimmey Ralph  ?

## 2022-04-14 NOTE — Telephone Encounter (Signed)
.. ?  Encourage patient to contact the pharmacy for refills or they can request refills through [facility] ? ?LAST APPOINTMENT DATE:  12/22/21 ? ?NEXT APPOINTMENT DATE: ? ?MEDICATION:cyclobenzaprine (FLEXERIL) 10 MG tablet ? ?Is the patient out of medication?  ? ?PHARMACY: [facility] [phone], [address], [place], [place] [address] ? ?Let patient know to contact pharmacy at the end of the day to make sure medication is ready. ? ?Please notify patient to allow 48-72 hours to process  ?

## 2022-04-14 NOTE — Telephone Encounter (Signed)
Last appt: 12/22/21 ? ?Next visit: none ? ?Last filled: 12/22/21 ? ?Quantity: 30 ?

## 2022-05-10 ENCOUNTER — Ambulatory Visit: Payer: Medicare Other | Admitting: Physician Assistant

## 2022-05-10 ENCOUNTER — Encounter: Payer: Self-pay | Admitting: Physician Assistant

## 2022-05-10 VITALS — BP 140/72 | HR 74 | Temp 98.4°F | Ht 71.0 in | Wt 251.2 lb

## 2022-05-10 DIAGNOSIS — S80862A Insect bite (nonvenomous), left lower leg, initial encounter: Secondary | ICD-10-CM

## 2022-05-10 DIAGNOSIS — W57XXXA Bitten or stung by nonvenomous insect and other nonvenomous arthropods, initial encounter: Secondary | ICD-10-CM | POA: Diagnosis not present

## 2022-05-10 DIAGNOSIS — Z7185 Encounter for immunization safety counseling: Secondary | ICD-10-CM | POA: Diagnosis not present

## 2022-05-10 MED ORDER — DOXYCYCLINE HYCLATE 100 MG PO TABS: 200.0000 mg | ORAL_TABLET | Freq: Once | ORAL | 0 refills | Status: AC

## 2022-05-10 NOTE — Patient Instructions (Signed)
In most cases a tick bite is painless and does not itch.  Most tick bites in which the tick is quickly removed do not require prescriptions. Ticks can transmit several diseases if they are infected and remain attacked to your skin. Therefore the length that the tick was attached and any symptoms you have experienced after the bite are import to accurately develop your custom treatment plan. In most cases a single dose of doxycycline may prevent the development of a more serious condition. I have sent this in for you.  Which ticks  are associated with illness? The Wood Tick (dog tick) is the size of a watermelon seed and can sometimes transmit [facility] spotted fever and [place] tick fever.   The Deer Tick (black-legged tick) is between the size of a poppy seed (pin head) and an apple seed, and can sometimes transmit Lyme disease.  A brown to black tick with a white splotch on its back is likely a male Amblyomma americanum ([place] tick). This tick has been associated with Southern Tick Associated illness ( STARI)  Lyme disease has become the most common tick-borne illness in the [place]. The risk of Lyme disease following a recognized deer tick bite is estimated to be 1%.  The majority of cases of Lyme disease start with a bull's eye rash at the site of the tick bite. The rash can occur days to weeks (typically 7-10 days) after a tick bite. Treatment with antibiotics is indicated if this rash appears. Flu-like symptoms may accompany the rash, including: fever, chills, headaches, muscle aches, and fatigue. Removing ticks promptly may prevent tick borne disease.  What can be used to prevent Tick Bites?  Insect repellant with at leas 20% DEET. Wearing long pants with sock and shoes. Avoiding tall grass and heavily wooded areas. Checking your skin after being outdoors. Shower with a washcloth after outdoor exposures.  HOME CARE ADVICE FOR TICK BITE  Wood Tick Removal:  Use a  pair of tweezers and grasp the wood tick close to the skin (on its head). Pull the wood tick straight upward without twisting or crushing it. Maintain a steady pressure until it releases its grip.   If tweezers aren't available, use fingers, a loop of thread around the jaws, or a needle between the jaws for traction.  Note: covering the tick with petroleum jelly, nail polish or rubbing alcohol doesn't work. Neither does touching the tick with a hot or cold object. Tiny Deer Tick Removal:   Needs to be scraped off with a knife blade or credit card edge. Place tick in a sealed container (e.g. glass jar, zip lock plastic bag), in case your doctor wants to see it. Tick's Head Removal:  If the wood tick's head breaks off in the skin, it must be removed. Clean the skin. Then use a sterile needle to uncover the head and lift it out or scrape it off.  If a very small piece of the head remains, the skin will eventually slough it off. Antibiotic Ointment:  Wash the wound and your hands with soap and water after removal to prevent catching any tick disease.  Apply an over the counter antibiotic ointment (e.g. bacitracin) to the bite once. Expected Course: Tick bites normally don't itch or hurt. That's why they often go unnoticed. Call Your Doctor If:  You can't remove the tick or the tick's head Fever, a severe head ache, or rash occur in the next 2 weeks Bite begins to look  infected Lyme's disease is common in your area You have not had a tetanus in the last 10 years Your current symptoms become worse    MAKE SURE YOU  Understand these instructions. Will watch your condition. Will get help right away if you are not doing well or get worse.

## 2022-05-10 NOTE — Progress Notes (Signed)
Roy Avila is a 71 y.o. male here for a new problem.  History of Present Illness:   Chief Complaint  Patient presents with   Insect Bite    Pt was bit by a tick on Friday left calf, he removed the tick and cleaned the area. The area is red, no itching.    HPI  Insect Bite Patient complain of tick bite that happened about 4 days ago. Located on left shin.  He states he has removed the tick and has cleaned the area immediately after it happened. Has had noticed some redness. No specific treatment tried. No discharge. Denies itching or pain. No fever or chills. Denies numbness or tingling. No reported neck stiffness or joint pain. Denies headaches. No other worsening symptoms.   Daughter is currently being treated for Lyme Disease so he is quite anxious about this.   Vaccination Counseling Patient has received one bi-valent COVID vaccine. He is leaving [place] soon. He would like to know if she should get an updated booster. Denies recent personal covid illness.    Past Medical History:  Diagnosis Date   Abnormal LFTs (liver function tests), monitoring, likely due to fatty liver, may need [name] 01/27/2017   Anxiety    Back pain    Chronic otitis media after insertion of tympanic ventilation tube, right 07/13/2018   Depression    GERD (gastroesophageal reflux disease)    High blood sugar    Hyperlipidemia    Hypertension    Obstructive sleep apnea syndrome 01/22/2017   Prediabetes    Sleep apnea    Swallowing difficulty      Social History   Tobacco Use   Smoking status: Some Days    Years: 30.00    Types: Cigarettes   Smokeless tobacco: Never   Tobacco comments:    Social smoker, 1 pack per week  Substance Use Topics   Alcohol use: Yes    Comment: socially   Drug use: No    Past Surgical History:  Procedure Laterality Date   HERNIA REPAIR      Family History  Problem Relation Age of Onset   Breast cancer Mother    Hypertension Mother    Obesity  Mother    Heart disease Father    Hypertension Father    Hyperlipidemia Father    Sudden death Father    Prostate cancer Neg Hx    Colon cancer Neg Hx     Allergies  Allergen Reactions   Clarithromycin Rash    Current Medications:   Current Outpatient Medications:    amLODipine (NORVASC) 5 MG tablet, TAKE 1 TABLET DAILY (DUE FOR APPOINTMENT), Disp: 90 tablet, Rfl: 3   atorvastatin (LIPITOR) 20 MG tablet, TAKE 1 TABLET DAILY (DUE FOR APPOINTMENT), Disp: 90 tablet, Rfl: 3   doxycycline (VIBRA-TABS) 100 MG tablet, Take 100 mg by mouth daily., Disp: , Rfl:    doxycycline (VIBRA-TABS) 100 MG tablet, Take 2 tablets (200 mg total) by mouth once for 1 dose., Disp: 2 tablet, Rfl: 0   esomeprazole (NEXIUM) 40 MG capsule, TAKE 1 CAPSULE DAILY, Disp: 90 capsule, Rfl: 3   isotretinoin (ACCUTANE) 10 MG capsule, Take 30 mg by mouth daily., Disp: , Rfl:    metFORMIN (GLUCOPHAGE-XR) 500 MG 24 hr tablet, TAKE 1 TABLET AT BEDTIME, Disp: 90 tablet, Rfl: 3   olmesartan-hydrochlorothiazide (BENICAR HCT) 40-25 MG tablet, TAKE 1 TABLET DAILY, Disp: 90 tablet, Rfl: 3   Review of Systems:   ROS Negative unless  otherwise specified per HPI.   Vitals:   Vitals:   05/10/22 1304  BP: 140/72  Pulse: 74  Temp: 98.4 F (36.9 C)  TempSrc: Temporal  SpO2: 94%  Weight: 251 lb 4 oz (114 kg)  Height: 5\' 11"  (1.803 m)     Body mass index is 35.04 kg/m.  Physical Exam:   Physical Exam Vitals and nursing note reviewed.  Constitutional:      Appearance: He is well-developed.  HENT:     Head: Normocephalic.  Eyes:     Conjunctiva/sclera: Conjunctivae normal.     Pupils: Pupils are equal, round, and reactive to light.  Pulmonary:     Effort: Pulmonary effort is normal.  Musculoskeletal:        General: Normal range of motion.     Cervical back: Normal range of motion.  Skin:    General: Skin is warm and dry.     Comments: Left anterior shin with approximately 1/2 cm area of eschar with  surrounding erythema; no TTP/warmth  Neurological:     Mental Status: He is alert and oriented to person, place, and time.  Psychiatric:        Behavior: Behavior normal.        Thought Content: Thought content normal.        Judgment: Judgment normal.     Assessment and Plan:   Insect bite of left lower leg, initial encounter No red flags on exam Will offer single dose of 200 mg doxycycline for treatment of this I do not recommend further testing or treatment beyond this List of worsening precautions advised and provided on AVS  Immunization counseling Due to age >24, I did recommend proceeding with updated repeat Bi-Valent booster Patient is agreeable to this and will obtain and local pharmacy  I,Savera Zaman,acting as a scribe for >73, PA.,have documented all relevant documentation on the behalf of [employer], PA,as directed by  Jarold Motto, PA while in the presence of Jarold Motto, Jarold Motto.   I, [place], Jarold Motto, have reviewed all documentation for this visit. The documentation on 05/10/22 for the exam, diagnosis, procedures, and orders are all accurate and complete.   07/10/22, PA-C

## 2022-05-17 ENCOUNTER — Other Ambulatory Visit: Payer: Self-pay | Admitting: Family Medicine

## 2022-05-17 DIAGNOSIS — E7849 Other hyperlipidemia: Secondary | ICD-10-CM

## 2022-07-07 ENCOUNTER — Encounter (INDEPENDENT_AMBULATORY_CARE_PROVIDER_SITE_OTHER): Payer: Self-pay

## 2022-08-12 ENCOUNTER — Other Ambulatory Visit: Payer: Self-pay | Admitting: Family Medicine

## 2022-08-12 DIAGNOSIS — I10 Essential (primary) hypertension: Secondary | ICD-10-CM

## 2022-08-23 ENCOUNTER — Encounter: Payer: Self-pay | Admitting: *Deleted

## 2022-10-28 ENCOUNTER — Ambulatory Visit: Payer: Medicare Other | Admitting: Family Medicine

## 2022-10-28 ENCOUNTER — Encounter: Payer: Self-pay | Admitting: Family Medicine

## 2022-10-28 VITALS — BP 135/77 | HR 70 | Temp 97.3°F | Ht 71.0 in | Wt 256.2 lb

## 2022-10-28 DIAGNOSIS — E1165 Type 2 diabetes mellitus with hyperglycemia: Secondary | ICD-10-CM | POA: Diagnosis not present

## 2022-10-28 DIAGNOSIS — E785 Hyperlipidemia, unspecified: Secondary | ICD-10-CM | POA: Diagnosis not present

## 2022-10-28 DIAGNOSIS — M7989 Other specified soft tissue disorders: Secondary | ICD-10-CM

## 2022-10-28 DIAGNOSIS — E559 Vitamin D deficiency, unspecified: Secondary | ICD-10-CM

## 2022-10-28 DIAGNOSIS — I10 Essential (primary) hypertension: Secondary | ICD-10-CM | POA: Diagnosis not present

## 2022-10-28 LAB — LIPID PANEL
Cholesterol: 108 mg/dL (ref 0–200)
HDL: 28.1 mg/dL — ABNORMAL LOW (ref >39.00)
NonHDL: 79.6
Total CHOL/HDL Ratio: 4
Triglycerides: 268 mg/dL — ABNORMAL HIGH (ref 0.0–149.0)
VLDL: 53.6 mg/dL — ABNORMAL HIGH (ref 0.0–40.0)

## 2022-10-28 LAB — COMPREHENSIVE METABOLIC PANEL
ALT: 28 U/L (ref 0–53)
AST: 22 U/L (ref 0–37)
Albumin: 4 g/dL (ref 3.5–5.2)
Alkaline Phosphatase: 93 U/L (ref 39–117)
BUN: 11 mg/dL (ref 6–23)
CO2: 32 mEq/L (ref 19–32)
Calcium: 9 mg/dL (ref 8.4–10.5)
Chloride: 100 mEq/L (ref 96–112)
Creatinine, Ser: 0.9 mg/dL (ref 0.40–1.50)
GFR: 86.54 mL/min (ref >60.00)
Glucose, Bld: 104 mg/dL — ABNORMAL HIGH (ref 70–99)
Potassium: 3.7 mEq/L (ref 3.5–5.1)
Sodium: 140 mEq/L (ref 135–145)
Total Bilirubin: 0.6 mg/dL (ref 0.2–1.2)
Total Protein: 7.1 g/dL (ref 6.0–8.3)

## 2022-10-28 LAB — CBC
HCT: 38.5 % — ABNORMAL LOW (ref 39.0–52.0)
Hemoglobin: 13.1 g/dL (ref 13.0–17.0)
MCHC: 34 g/dL (ref 30.0–36.0)
MCV: 92.3 fl (ref 78.0–100.0)
Platelets: 346 10*3/uL (ref 150.0–400.0)
RBC: 4.17 Mil/uL — ABNORMAL LOW (ref 4.22–5.81)
RDW: 14.8 % (ref 11.5–15.5)
WBC: 8.9 10*3/uL (ref 4.0–10.5)

## 2022-10-28 LAB — LDL CHOLESTEROL, DIRECT: Direct LDL: 54 mg/dL

## 2022-10-28 LAB — TSH: TSH: 2.46 u[IU]/mL (ref 0.35–5.50)

## 2022-10-28 LAB — HEMOGLOBIN A1C: Hgb A1c MFr Bld: 6.6 % — ABNORMAL HIGH (ref 4.6–6.5)

## 2022-10-28 NOTE — Assessment & Plan Note (Signed)
Check A1c.  Continue metformin 500 mg daily. ?

## 2022-10-28 NOTE — Progress Notes (Signed)
   Roy Avila is a 71 y.o. male who presents today for an office visit.  Assessment/Plan:  New/Acute Problems: Leg Edema  No red flags.  Given unilateral nature doubt this represents DVT and has low Wells score today that we will check D-dimer to rule out.  Likely has some component of venous insufficiency.  He has also had several dietary indiscretions recently.  Discussed importance of leg elevation and salt avoidance.  He is on amlodipine which could be contributing as well though we will continue current medication for now.  He will let me know if symptoms or not improving or if they are worsening.  If D-dimer is positive will need to get ultrasound to rule out DVT.  Chronic Problems Addressed Today: Dyslipidemia On Lipitor 20 mg daily.  Check lipids today.  Type 2 diabetes mellitus with hyperglycemia, without long-term current use of insulin (HCC) Check A1c.  Continue metformin 500 mg daily.  Essential hypertension Blood pressure at goal today on amlodipine 5 mg daily and olmesartan-HCTZ 40-25 once daily.  Did discuss with patient that his amlodipine could be contributing some to his leg edema however given his borderline blood pressure readings today we will continue with current regimen for now.  If continues have significant issues with lower extremity edema we will switch to alternative.     Subjective:  HPI:  Patient here with right foot swelling.  Started about a week ago.  No obvious injuries or precipitating events.  No pain at rest though does feel some tightness then has some slight discomfort when moving his foot in certain motions.  Symptoms seem to be worse in the evening.  No specific treatments tried.  He is up about 15 pounds over the last year or so.  Does admit to several dietary discretions recently.    See A/P for status of chronic conditions.        Objective:  Physical Exam: BP 135/77   Pulse 70   Temp (!) 97.3 F (36.3 C) (Temporal)   Ht 5\' 11"   (1.803 m)   Wt 256 lb 3.2 oz (116.2 kg)   SpO2 97%   BMI 35.73 kg/m   Wt Readings from Last 3 Encounters:  10/28/22 256 lb 3.2 oz (116.2 kg)  05/10/22 251 lb 4 oz (114 kg)  12/22/21 244 lb (110.7 kg)  Gen: No acute distress, resting comfortably CV: Regular rate and rhythm with no murmurs appreciated Pulm: Normal work of breathing, clear to auscultation bilaterally with no crackles, wheezes, or rhonchi MSK: - Legs: 1+ pitting edema to knees bilaterally.  Right leg slightly more edematous than the left leg.  Right foot with full range of motion throughout.  Neurovascular intact distally. Neuro: Grossly normal, moves all extremities Psych: Normal affect and thought content      Roy Avila M. 12/24/21, MD 10/28/2022 2:10 PM

## 2022-10-28 NOTE — Assessment & Plan Note (Signed)
On Lipitor 20 mg daily.  Check lipids today. °

## 2022-10-28 NOTE — Patient Instructions (Signed)
It was very nice to see you today!  We will check blood work.  I think your body is holding onto extra fluid.  I do not think you have a blood clot though we will check a blood test today to rule this out.  Please make sure that you are staying active and cutting down on sodium intake.  Let me know if not improving.  Take care, Dr Jimmey Ralph  PLEASE NOTE:  If you had any lab tests please let us know if you have not heard back within a few days. You may see your results on mychart before we have a chance to review them but we will give you a call once they are reviewed by [name]. If we ordered any referrals today, please let us know if you have not heard from their office within the next week.   Please try these tips to maintain a healthy lifestyle:  Eat at least 3 REAL meals and 1-2 snacks per day.  Aim for no more than 5 hours between eating.  If you eat breakfast, please do so within one hour of getting up.   Each meal should contain half fruits/vegetables, one quarter protein, and one quarter carbs (no bigger than a computer mouse)  Cut down on sweet beverages. This includes juice, soda, and sweet tea.   Drink at least 1 glass of water with each meal and aim for at least 8 glasses per day  Exercise at least 150 minutes every week.

## 2022-10-28 NOTE — Assessment & Plan Note (Signed)
Blood pressure at goal today on amlodipine 5 mg daily and olmesartan-HCTZ 40-25 once daily.  Did discuss with patient that his amlodipine could be contributing some to his leg edema however given his borderline blood pressure readings today we will continue with current regimen for now.  If continues have significant issues with lower extremity edema we will switch to alternative.

## 2022-10-29 LAB — D-DIMER, QUANTITATIVE: D-Dimer, Quant: 0.43 mcg/mL FEU (ref <0.50)

## 2022-10-29 NOTE — Progress Notes (Signed)
Please inform patient of the following:  His D-dimer is normal.  He does not have a blood clot.  He should work on reducing sodium intake and weight loss to help with his leg swelling as we discussed at his office visit.  He should let us know if this is not improving.  Cholesterol levels are stable compared to previous years.  His A1c is borderline but stable compared to previous years.  All of his other labs are stable.  Do not need to make any medication changes at this time.  We should see him back in 6 months to recheck A1c.  We can recheck everything else in a year.

## 2022-11-09 ENCOUNTER — Telehealth: Payer: Self-pay | Admitting: Family Medicine

## 2022-11-09 NOTE — Telephone Encounter (Signed)
Patient is calling in regards to lab results from 11/30. Requests callback to discuss this.

## 2022-11-09 NOTE — Telephone Encounter (Signed)
See results note. 

## 2023-02-14 ENCOUNTER — Encounter: Payer: Self-pay | Admitting: Family Medicine

## 2023-02-14 ENCOUNTER — Ambulatory Visit: Payer: Medicare Other | Admitting: Family Medicine

## 2023-02-14 VITALS — BP 145/76 | HR 68 | Temp 98.0°F | Ht 71.0 in | Wt 261.8 lb

## 2023-02-14 DIAGNOSIS — E1165 Type 2 diabetes mellitus with hyperglycemia: Secondary | ICD-10-CM

## 2023-02-14 DIAGNOSIS — I10 Essential (primary) hypertension: Secondary | ICD-10-CM

## 2023-02-14 MED ORDER — MOUNJARO 2.5 MG/0.5ML ~~LOC~~ SOAJ: 2.5000 mg | SUBCUTANEOUS | 0 refills | Status: DC

## 2023-02-14 MED ORDER — TIRZEPATIDE 5 MG/0.5ML ~~LOC~~ SOAJ: 5.0000 mg | SUBCUTANEOUS | 1 refills | Status: DC

## 2023-02-14 NOTE — Progress Notes (Signed)
   Quince Lamendola is a 72 y.o. male who presents today for an office visit.  Assessment/Plan:  Chronic Problems Addressed Today: Type 2 diabetes mellitus with hyperglycemia, without long-term current use of insulin (HCC) Last A1c 6.6 on metformin 500 mg daily.  We discussed treatment options.  Believe he would do very well with a GLP-1 agonist especially with history of coronary artery disease.  We will start Mounjaro 2.5 mg weekly and then increase to 5 mg weekly.  We discussed potential side effects.  He will send me a message in a few weeks to let me know how he is doing with this.  Essential hypertension Mildly elevated today to 145/76.  He is typically been well-controlled.  Hopefully will have some improvement with weight loss since we are starting Mounjaro.  Will continue current regimen Norvasc 5 mg daily and olmesartan-HCTZ 40-25 once daily.  He will continue to monitor at home and let us know if persistently elevated.     Subjective:  HPI:  See A/p for status of chronic conditions.  He is here today to discuss mediccations. We last saw him about 4 months ago. A1c at that time was 6.6 on metformin 500mg  daily.  He is interested in starting medication that will help lower her sugar back to normal as well as help him with weight loss.           Objective:  Physical Exam: BP (!) 145/76   Pulse 68   Temp 98 F (36.7 C) (Temporal)   Ht 5\' 11"  (1.803 m)   Wt 261 lb 12.8 oz (118.8 kg)   SpO2 96%   BMI 36.51 kg/m   Gen: No acute distress, resting comfortably CV: Regular rate and rhythm with no murmurs appreciated Pulm: Normal work of breathing, clear to auscultation bilaterally with no crackles, wheezes, or rhonchi Neuro: Grossly normal, moves all extremities Psych: Normal affect and thought content      Heaton Sarin M. Jerline Pain, MD 02/14/2023 2:56 PM

## 2023-02-14 NOTE — Patient Instructions (Addendum)
It was very nice to see you today!  Please start the mounjaro.  Take 2.5mg  weekly for 4 weeks and then increase to 5 mg weekly.  Please let me know if you have any significant side effects.  Please send me a message in a few weeks to let me know how you are doing.  I will see you back in the office in 3 months.   Take care, Dr Jerline Pain  PLEASE NOTE:  If you had any lab tests, please let us know if you have not heard back within a few days. You may see your results on mychart before we have a chance to review them but we will give you a call once they are reviewed by [name].   If we ordered any referrals today, please let us know if you have not heard from their office within the next week.   If you had any urgent prescriptions sent in today, please check with the pharmacy within an hour of our visit to make sure the prescription was transmitted appropriately.   Please try these tips to maintain a healthy lifestyle:  Eat at least 3 REAL meals and 1-2 snacks per day.  Aim for no more than 5 hours between eating.  If you eat breakfast, please do so within one hour of getting up.   Each meal should contain half fruits/vegetables, one quarter protein, and one quarter carbs (no bigger than a computer mouse)  Cut down on sweet beverages. This includes juice, soda, and sweet tea.   Drink at least 1 glass of water with each meal and aim for at least 8 glasses per day  Exercise at least 150 minutes every week.

## 2023-02-14 NOTE — Assessment & Plan Note (Signed)
Last A1c 6.6 on metformin 500 mg daily.  We discussed treatment options.  Believe he would do very well with a GLP-1 agonist especially with history of coronary artery disease.  We will start Mounjaro 2.5 mg weekly and then increase to 5 mg weekly.  We discussed potential side effects.  He will send me a message in a few weeks to let me know how he is doing with this.

## 2023-02-14 NOTE — Assessment & Plan Note (Addendum)
Mildly elevated today to 145/76.  He is typically been well-controlled.  Hopefully will have some improvement with weight loss since we are starting Mounjaro.  Will continue current regimen Norvasc 5 mg daily and olmesartan-HCTZ 40-25 once daily.  He will continue to monitor at home and let us know if persistently elevated.

## 2023-02-16 ENCOUNTER — Other Ambulatory Visit: Payer: Self-pay | Admitting: *Deleted

## 2023-03-16 ENCOUNTER — Telehealth: Payer: Self-pay | Admitting: Family Medicine

## 2023-03-16 NOTE — Telephone Encounter (Signed)
Add on order - Sleep apnea, new CPAP machine - over 71 years old.

## 2023-03-16 NOTE — Telephone Encounter (Signed)
Pt states he needs an RX for CPAP to be send to [facility] - Fax #[phone]

## 2023-03-17 ENCOUNTER — Other Ambulatory Visit: Payer: Self-pay

## 2023-03-17 DIAGNOSIS — G4730 Sleep apnea, unspecified: Secondary | ICD-10-CM

## 2023-03-17 NOTE — Telephone Encounter (Signed)
Patient aware that referral to [facility] ordered

## 2023-03-17 NOTE — Telephone Encounter (Signed)
We do not manage CPAP supplies. Recommend referral to sleep medicine.  Katina Degree. Jimmey Ralph, MD 03/17/2023 12:58 PM

## 2023-03-17 NOTE — Telephone Encounter (Signed)
Machine over 71 yr old, does he need new sleep study ordered? Please advise

## 2023-04-15 ENCOUNTER — Other Ambulatory Visit: Payer: Self-pay | Admitting: Family Medicine

## 2023-04-15 ENCOUNTER — Telehealth: Payer: Self-pay | Admitting: Family Medicine

## 2023-04-15 ENCOUNTER — Other Ambulatory Visit: Payer: Self-pay | Admitting: *Deleted

## 2023-04-15 DIAGNOSIS — I10 Essential (primary) hypertension: Secondary | ICD-10-CM

## 2023-04-15 MED ORDER — METFORMIN HCL ER 500 MG PO TB24: 500.0000 mg | ORAL_TABLET | Freq: Every day | ORAL | 0 refills | Status: DC

## 2023-04-15 MED ORDER — OLMESARTAN MEDOXOMIL-HCTZ 40-25 MG PO TABS: 1.0000 | ORAL_TABLET | Freq: Every day | ORAL | 0 refills | Status: DC

## 2023-04-15 MED ORDER — ESOMEPRAZOLE MAGNESIUM 40 MG PO CPDR: 40.0000 mg | DELAYED_RELEASE_CAPSULE | Freq: Every day | ORAL | 0 refills | Status: DC

## 2023-04-15 NOTE — Telephone Encounter (Signed)
Rx send to [facility]  

## 2023-04-15 NOTE — Telephone Encounter (Signed)
Patient states he leaving on a trip 04/19/23 and requests 10 day supply RX's be sent asap for the following medications (will not receive [facility] RX's in time for trip)  olmesartan-hydrochlorothiazide (BENICAR HCT) 40-25 MG tablet   esomeprazole (NEXIUM) 40 MG capsule   metFORMIN (GLUCOPHAGE-XR) 500 MG 24 hr tablet   Be [facility] to  [id number] [place], [place] - [address] [name] [address] AT [place] [address] [name] [address] Phone: [phone]  Fax: [phone]

## 2023-04-19 ENCOUNTER — Other Ambulatory Visit: Payer: Self-pay | Admitting: *Deleted

## 2023-04-19 ENCOUNTER — Telehealth: Payer: Self-pay | Admitting: Family Medicine

## 2023-04-19 DIAGNOSIS — I10 Essential (primary) hypertension: Secondary | ICD-10-CM

## 2023-04-19 MED ORDER — OLMESARTAN MEDOXOMIL-HCTZ 40-25 MG PO TABS
1.0000 | ORAL_TABLET | Freq: Every day | ORAL | 1 refills | Status: DC
Start: 2023-04-19 — End: 2023-11-25

## 2023-04-19 MED ORDER — METFORMIN HCL ER 500 MG PO TB24: 500.0000 mg | ORAL_TABLET | Freq: Every day | ORAL | 10 refills | Status: DC

## 2023-04-19 NOTE — Telephone Encounter (Signed)
Rx send to [facility] pharmacy

## 2023-04-19 NOTE — Telephone Encounter (Signed)
Patient requests RX's for 90 day supply of the following medications :  metFORMIN (GLUCOPHAGE-XR) 500 MG 24 hr tablet  olmesartan-hydrochlorothiazide (BENICAR HCT) 40-25 MG table  Be sent to:  [facility] - [place], [place] - [address] Phone: [phone]  Fax: [phone]

## 2023-05-02 ENCOUNTER — Institutional Professional Consult (permissible substitution): Payer: Medicare Other | Admitting: Neurology

## 2023-05-03 ENCOUNTER — Telehealth: Payer: Self-pay | Admitting: *Deleted

## 2023-05-03 NOTE — Telephone Encounter (Signed)
Pt no showed new patient appt on 05/02/23. He has been r/s for 05/25/23.

## 2023-05-25 ENCOUNTER — Telehealth: Payer: Self-pay | Admitting: Neurology

## 2023-05-25 ENCOUNTER — Institutional Professional Consult (permissible substitution): Payer: Medicare Other | Admitting: Neurology

## 2023-05-25 NOTE — Telephone Encounter (Signed)
LVM and sent mychart msg informing pt of need to reschedule 05/25/23 appt - MD out 

## 2023-06-15 ENCOUNTER — Institutional Professional Consult (permissible substitution): Payer: Medicare Other | Admitting: Neurology

## 2023-06-15 ENCOUNTER — Encounter: Payer: Self-pay | Admitting: Neurology

## 2023-06-15 VITALS — BP 140/73 | HR 73 | Ht 72.0 in | Wt 242.0 lb

## 2023-06-15 DIAGNOSIS — G4733 Obstructive sleep apnea (adult) (pediatric): Secondary | ICD-10-CM | POA: Diagnosis not present

## 2023-06-15 DIAGNOSIS — R634 Abnormal weight loss: Secondary | ICD-10-CM

## 2023-06-15 DIAGNOSIS — E669 Obesity, unspecified: Secondary | ICD-10-CM | POA: Diagnosis not present

## 2023-06-15 DIAGNOSIS — R351 Nocturia: Secondary | ICD-10-CM | POA: Diagnosis not present

## 2023-06-15 DIAGNOSIS — E66811 Obesity, class 1: Secondary | ICD-10-CM

## 2023-06-15 NOTE — Patient Instructions (Signed)
Based on your symptoms and your exam I believe you are still at risk for obstructive sleep apnea or OSA. We will do a repeat test at home. After the test, we will reach out to you.  I will write for a new machine after the test confirms your sleep apnea diagnosis.   Please remember, the risks and ramifications of moderate to severe obstructive sleep apnea or OSA are: Cardiovascular disease, including congestive heart failure, stroke, difficult to control hypertension, arrhythmias, and even type 2 diabetes has been linked to untreated OSA. Sleep apnea causes disruption of sleep and sleep deprivation in most cases, which, in turn, can cause recurrent headaches, problems with memory, mood, concentration, focus, and vigilance. Most people with untreated sleep apnea report excessive daytime sleepiness, which can affect their ability to drive. Please do not drive if you feel sleepy.   We will do a home sleep test for your convenience. You will take equipment home for this and bring it back the next day for analysis.  Please remember not to use your CPAP the night of testing.  You can take a nap in the morning with your CPAP if you do not sleep well.  We do need some diagnostic data to be able to diagnose your sleep apnea again via home sleep test.  Our sleep lab administrative assistant will call you to schedule your sleep equipment pick up so you or your family member can learn how to put the sensors on at night. If you don't hear back from her by next week please feel free to call her at [phone]. This is her direct line and please leave a message with your phone number to call back if you get the voicemail box. She will call back as soon as possible.

## 2023-06-15 NOTE — Progress Notes (Signed)
Subjective:    Patient ID: Roy Avila is a 72 y.o. male.  HPI    [name], MD, PhD [facility] [address] [address] [place], [place] [address]  Dear Dr. Jimmey Ralph,  I saw your patient, Roy Avila, upon your kind request in my sleep clinic today for evaluation of his sleep apnea.  The patient is unaccompanied today. He missed an appointment on 05/02/23. As you know, Roy Avila is a 72 year old male with an underlying medical history of hypertension, hyperlipidemia, prediabetes, back pain, anxiety, depression, reflux disease, and obesity, who was diagnosed with obstructive sleep apnea about 9 years ago.  He has been on PAP therapy.  His Epworth sleepiness score is 6/24, fatigue severity score is 42 out of 63.  He is retired, he lives with his wife, they have 2 dogs in the household.  He watches TV in bed until he feels sleepy, usually turns the TV off, falls asleep around 11 or 11:30 PM, rise time is usually before 7 AM.  He has nocturia about 2-3 times per average night.  He takes a nap every day for about an hour.  He is consistent with his CPAP and has continued to benefit from it.  He is up-to-date with his supplies he has seen an error message on the machine that the motor life has been exceeded.  He uses a so clean machine successfully, he uses nasal pillows for his mask with good tolerance.  I was able to review his CPAP compliance data from the past 30 and 90 days.  Between 03/18/2023 and 06/14/2023 he used his machine every night, average usage 8 hours and 30 minutes, percent use days greater than 4 hours at 97%, indicating excellent compliance with an average AHI of 1.7/h, at goal, leak acceptable with the 95th percentile at 13.7 L/min on a pressure of 7 cm with EPR of 3.  He is working on weight loss.  He has been on Mounjaro injections for the past 3 months and has lost about 20 pounds.  He is very pleased.  He drinks caffeine in the form of hot tea, 1  or 2 cups/day.  He smokes cigarettes about a pack per week.  He would like to quit smoking altogether.  He drinks alcohol about once or twice a week.  I reviewed your office note from 02/14/2023.  I had evaluated him several years ago for sleep apnea concern.   He had a home sleep test on 09/30/2014 which showed moderate obstructive sleep apnea with an AHI of 15.7/h, O2 nadir 77% with mild to moderate snoring detected.   He had a subsequent CPAP titration study on 12/11/2014, at which time he did well with CPAP of 7 cm.  He had moderate PLM's with minimal arousals during that study. He had not subsequent appointment in our clinic.  Previously:  09/12/2014: 72 year old right-handed gentleman with an underlying medical history of obesity, HTN, GER, and anxiety, who was noted to have apneic spells during his colonoscopy procedure recently, earlier this month. He does report a long-standing history of loud snoring. He does not always wake up rested and has some daytime tiredness and sometimes takes a nap. He has a family history of loud snoring. He has a cousin with long-standing history of sleep apnea on a CPAP machine. He is somewhat reluctant to consider a CPAP machine himself but would be open to treatment for his sleep apnea. He has nocturia once or twice per night.  He goes to bed around 11:30 and usually falls asleep quickly. He wakes up between 7 and 9 AM. He denies any morning headaches, restless leg symptoms or twitching in his sleep. He has occasional reflux symptoms and rare anxiety issues for which he takes as needed Xanax. He would like to consider home sleep test over a lab attended sleep study. His Epworth sleepiness score is 9 today. He is trying to lose weight and has been able to lose several pounds so far.  His Past Medical History Is Significant For: Past Medical History:  Diagnosis Date   Abnormal LFTs (liver function tests), monitoring, likely due to fatty liver, may need [name] 01/27/2017    Anxiety    Back pain    Chronic otitis media after insertion of tympanic ventilation tube, right 07/13/2018   Depression    GERD (gastroesophageal reflux disease)    High blood sugar    Hyperlipidemia    Hypertension    Obstructive sleep apnea syndrome 01/22/2017   Prediabetes    Sleep apnea    Swallowing difficulty     His Past Surgical History Is Significant For: Past Surgical History:  Procedure Laterality Date   HERNIA REPAIR      His Family History Is Significant For: Family History  Problem Relation Age of Onset   Breast cancer Mother    Hypertension Mother    Obesity Mother    Heart disease Father    Hypertension Father    Hyperlipidemia Father    Sudden death Father    Prostate cancer Neg Hx    Colon cancer Neg Hx     His Social History Is Significant For: Social History   Socioeconomic History   Marital status: Married    Spouse name: Roy Avila   Number of children: 2   Years of education: college 4   Highest education level: Not on file  Occupational History   Occupation: Retired from [employer]   Occupation: Sales  Tobacco Use   Smoking status: Some Days    Types: Cigarettes   Smokeless tobacco: Never   Tobacco comments:    Social smoker, 1 pack per week  Substance and Sexual Activity   Alcohol use: Yes    Alcohol/week: 4.0 standard drinks of alcohol    Types: 4 Shots of liquor per week    Comment: socially   Drug use: No   Sexual activity: Yes    Partners: Female    Birth control/protection: None  Other Topics Concern   Not on file  Social History Narrative   Not on file   Social Determinants of Health   Financial Resource Strain: Not on file  Food Insecurity: Not on file  Transportation Needs: Not on file  Physical Activity: Not on file  Stress: Not on file  Social Connections: Not on file    His Allergies Are:  Allergies  Allergen Reactions   Clarithromycin Rash  :   His Current Medications Are:  Outpatient Encounter Medications  as of 06/15/2023  Medication Sig   amLODipine (NORVASC) 5 MG tablet TAKE 1 TABLET DAILY (DUE FOR APPOINTMENT)   atorvastatin (LIPITOR) 20 MG tablet TAKE 1 TABLET DAILY (DUE FOR APPOINTMENT)   esomeprazole (NEXIUM) 40 MG capsule Take 1 capsule (40 mg total) by mouth daily.   metFORMIN (GLUCOPHAGE-XR) 500 MG 24 hr tablet Take 1 tablet (500 mg total) by mouth at bedtime.   olmesartan-hydrochlorothiazide (BENICAR HCT) 40-25 MG tablet Take 1 tablet by mouth daily.   tirzepatide [facility])  2.5 MG/0.5ML Pen Inject 2.5 mg into the skin once a week.   tirzepatide [facility]) 5 MG/0.5ML Pen Inject 5 mg into the skin once a week.   No facility-administered encounter medications on file as of 06/15/2023.  :   Review of Systems:  Out of a complete 14 point review of systems, all are reviewed and negative with the exception of these symptoms as listed below:  Review of Systems  Neurological:        Pt here for sleep consult Pt states wear CPAP nightly Pt states needing a new CPAP machine Message on pap saying motor life exceeded  Pt set up date 01/08/2015 . Sleep study done 2016   ESS [facility]    Objective:  Neurological Exam  Physical Exam Physical Examination:   Vitals:   06/15/23 0754  BP: (!) 140/73  Pulse: 73    General Examination: The patient is a very pleasant 72 y.o. male in no acute distress. He appears well-developed and well-nourished and well groomed.   HEENT: Normocephalic, atraumatic, pupils are equal, round and reactive to light, corrective eyeglasses in place.  Extraocular tracking is good without limitation to gaze excursion or nystagmus noted. Hearing is grossly intact. Face is symmetric with normal facial animation. Speech is clear with no dysarthria noted. There is no hypophonia. There is no lip, neck/head, jaw or voice tremor. Neck is supple with full range of passive and active motion. There are no carotid bruits on auscultation. Oropharynx exam reveals: mild mouth dryness,  adequate dental hygiene and moderate airway crowding, due to Mallampati class III, redundant soft palate, uvula not fully visualized.  Tonsils about 1+ in size, neck circumference 19-1/2 inches.  He has a minimal overbite.  Tongue protrudes centrally and palate elevates symmetrically.  Chest: Clear to auscultation without wheezing, rhonchi or crackles noted.  Heart: S1+S2+0, regular and normal without murmurs, rubs or gallops noted.   Abdomen: Soft, non-tender and non-distended.  Extremities: There is no pitting edema in the distal lower extremities bilaterally.   Skin: Warm and dry without trophic changes noted.   Musculoskeletal: exam reveals no obvious joint deformities.   Neurologically:  Mental status: The patient is awake, alert and oriented in all 4 spheres. His immediate and remote memory, attention, language skills and fund of knowledge are appropriate. There is no evidence of aphasia, agnosia, apraxia or anomia. Speech is clear with normal prosody and enunciation. Thought process is linear. Mood is normal and affect is normal.  Cranial nerves II - XII are as described above under HEENT exam.  Motor exam: Normal bulk, strength and tone is noted. There is no obvious action or resting tremor.  Fine motor skills and coordination: grossly intact.  Cerebellar testing: No dysmetria or intention tremor. There is no truncal or gait ataxia.  Sensory exam: intact to light touch in the upper and lower extremities.  Gait, station and balance: He stands easily. No veering to one side is noted. No leaning to one side is noted. Posture is age-appropriate and stance is narrow based. Gait shows normal stride length and normal pace. No problems turning are noted.   Assessment and Plan:  In summary, Roy Avila is a very pleasant 73 y.o.-year old male with an underlying medical history of hypertension, hyperlipidemia, prediabetes, back pain, anxiety, depression, reflux disease, and obesity, who  presents for evaluation of his obstructive sleep apnea which was in the moderate range by home sleep testing in 2015.  He has been on CPAP therapy since  2016 and is compliant with treatment, has continued to benefit from it.  He is commended for his treatment adherence, numbers look good but he does have an older machine and should be eligible for new machine.  We talked about some of the differences between the ResMed air sense 11 AutoSet machine versus his current model.  He is also encouraged to look out for these models online for comparison.  He may want to stay with his current model if he gets a new machine.  He uses nasal pillows, he would like to explore other nasal mask options.   We talked about the importance of treating sleep apnea.  He is also commended for his weight loss success and advised to continue to work on it.   We also talked about alternative treatment options to CPAP therapy.  We mutually agreed that he has done really well with PAP therapy.  At this juncture, we will proceed with a home sleep test for reevaluation.  He is reminded not to use his PAP machine that night.  It may be difficult for him to sleep well that night but he can always take a nap after the test is completed.  He is agreeable to this.  We will plan a follow-up after testing and after he has been issued a new machine for a compliance recheck.   I explained the oral appliance treatment options to him as well as the inspire device as he has seen an ad on TV for it.  Of note, he uses a retainer at night currently.   We will talk to him soon after insurance approval to get him set up for home sleep testing.  I answered all his questions today and he was in agreement with our plan.  Of note, for his significant nocturia he is encouraged to talk to you about potentially trying medication, and pursuing a consultation with urology even. Thank you very much for allowing me to participate in the care of this nice patient. If  I can be of any further assistance to you please do not hesitate to call me at [phone].  Sincerely,   [name], MD, PhD

## 2023-06-28 ENCOUNTER — Ambulatory Visit: Payer: Medicare Other | Admitting: Family Medicine

## 2023-07-05 ENCOUNTER — Ambulatory Visit: Payer: Medicare Other | Admitting: Neurology

## 2023-07-05 DIAGNOSIS — E669 Obesity, unspecified: Secondary | ICD-10-CM

## 2023-07-05 DIAGNOSIS — G4733 Obstructive sleep apnea (adult) (pediatric): Secondary | ICD-10-CM

## 2023-07-05 DIAGNOSIS — R634 Abnormal weight loss: Secondary | ICD-10-CM

## 2023-07-05 DIAGNOSIS — E66811 Obesity, class 1: Secondary | ICD-10-CM

## 2023-07-05 DIAGNOSIS — R351 Nocturia: Secondary | ICD-10-CM

## 2023-07-06 ENCOUNTER — Institutional Professional Consult (permissible substitution): Payer: Medicare Other | Admitting: Neurology

## 2023-07-07 ENCOUNTER — Encounter: Payer: Self-pay | Admitting: Physician Assistant

## 2023-07-07 ENCOUNTER — Ambulatory Visit: Payer: Medicare Other | Admitting: Family Medicine

## 2023-07-07 ENCOUNTER — Ambulatory Visit: Payer: Medicare Other | Admitting: Physician Assistant

## 2023-07-07 VITALS — BP 124/77 | HR 74 | Temp 97.1°F | Ht 72.0 in | Wt 239.8 lb

## 2023-07-07 DIAGNOSIS — E119 Type 2 diabetes mellitus without complications: Secondary | ICD-10-CM

## 2023-07-07 DIAGNOSIS — E1165 Type 2 diabetes mellitus with hyperglycemia: Secondary | ICD-10-CM

## 2023-07-07 DIAGNOSIS — I10 Essential (primary) hypertension: Secondary | ICD-10-CM | POA: Diagnosis not present

## 2023-07-07 DIAGNOSIS — Z7985 Long-term (current) use of injectable non-insulin antidiabetic drugs: Secondary | ICD-10-CM | POA: Diagnosis not present

## 2023-07-07 DIAGNOSIS — E785 Hyperlipidemia, unspecified: Secondary | ICD-10-CM

## 2023-07-07 DIAGNOSIS — Z7984 Long term (current) use of oral hypoglycemic drugs: Secondary | ICD-10-CM | POA: Diagnosis not present

## 2023-07-07 LAB — LIPID PANEL
Cholesterol: 111 mg/dL (ref 0–200)
HDL: 31 mg/dL — ABNORMAL LOW (ref >39.00)
LDL Cholesterol: 49 mg/dL (ref 0–99)
NonHDL: 80.04
Total CHOL/HDL Ratio: 4
Triglycerides: 155 mg/dL — ABNORMAL HIGH (ref 0.0–149.0)
VLDL: 31 mg/dL (ref 0.0–40.0)

## 2023-07-07 LAB — COMPREHENSIVE METABOLIC PANEL
ALT: 22 U/L (ref 0–53)
AST: 19 U/L (ref 0–37)
Albumin: 4.3 g/dL (ref 3.5–5.2)
Alkaline Phosphatase: 99 U/L (ref 39–117)
BUN: 16 mg/dL (ref 6–23)
CO2: 28 mEq/L (ref 19–32)
Calcium: 10.1 mg/dL (ref 8.4–10.5)
Chloride: 100 mEq/L (ref 96–112)
Creatinine, Ser: 0.72 mg/dL (ref 0.40–1.50)
GFR: 92.13 mL/min (ref >60.00)
Glucose, Bld: 110 mg/dL — ABNORMAL HIGH (ref 70–99)
Potassium: 3.9 mEq/L (ref 3.5–5.1)
Sodium: 138 mEq/L (ref 135–145)
Total Bilirubin: 0.6 mg/dL (ref 0.2–1.2)
Total Protein: 7.6 g/dL (ref 6.0–8.3)

## 2023-07-07 LAB — VITAMIN B12: Vitamin B-12: 397 pg/mL (ref 211–911)

## 2023-07-07 LAB — CBC
HCT: 42.9 % (ref 39.0–52.0)
Hemoglobin: 13.8 g/dL (ref 13.0–17.0)
MCHC: 32.2 g/dL (ref 30.0–36.0)
MCV: 93 fl (ref 78.0–100.0)
Platelets: 433 10*3/uL — ABNORMAL HIGH (ref 150.0–400.0)
RBC: 4.62 Mil/uL (ref 4.22–5.81)
RDW: 15 % (ref 11.5–15.5)
WBC: 11.9 10*3/uL — ABNORMAL HIGH (ref 4.0–10.5)

## 2023-07-07 LAB — POCT GLYCOSYLATED HEMOGLOBIN (HGB A1C): Hemoglobin A1C: 5.5 % (ref 4.0–5.6)

## 2023-07-07 MED ORDER — METFORMIN HCL ER 500 MG PO TB24: 500.0000 mg | ORAL_TABLET | Freq: Every day | ORAL | 1 refills | Status: DC

## 2023-07-07 MED ORDER — AMLODIPINE BESYLATE 5 MG PO TABS
ORAL_TABLET | ORAL | 3 refills | Status: DC
Start: 2023-07-07 — End: 2023-12-12

## 2023-07-07 MED ORDER — ATORVASTATIN CALCIUM 20 MG PO TABS
20.0000 mg | ORAL_TABLET | Freq: Every day | ORAL | 1 refills | Status: DC
Start: 2023-07-07 — End: 2023-07-27

## 2023-07-07 MED ORDER — TIRZEPATIDE 5 MG/0.5ML ~~LOC~~ SOAJ: 5.0000 mg | SUBCUTANEOUS | 1 refills | Status: DC

## 2023-07-07 NOTE — Progress Notes (Signed)
Roy Avila is a 72 y.o. male here for a follow up of a pre-existing problem.  History of Present Illness:   Chief Complaint  Patient presents with   Medication Refill    Rx Mounjaro    Diabetes    HPI  Diabetes: Compliant with 500 mg Metformin XR daily, 5 mg Mounjaro weekly. States he has not used it for a week and a half because he ran out.   Tolerating Mounjaro well.  Reports no dietary changes.  Has lost at least 20 pounds since starting medication. Lab Results  Component Value Date   HGBA1C 5.5 07/07/2023   Hypertension: Compliant with 5 mg Amlodipine and 40-25 mg Benicar hydrochlorothiazide Blood pressure is well controlled currently Denies lightheadedness/dizziness  BP Readings from Last 3 Encounters:  07/07/23 124/77  06/15/23 (!) 140/73  02/14/23 (!) 145/76   Hyperlipidemia: Compliant with 20 mg Atorvastatin. Lab Results  Component Value Date   CHOL 108 10/28/2022   HDL 28.10 (L) 10/28/2022   LDLCALC 79 08/20/2021   LDLDIRECT 54.0 10/28/2022   TRIG 268.0 (H) 10/28/2022   CHOLHDL 4 10/28/2022    Past Medical History:  Diagnosis Date   Abnormal LFTs (liver function tests), monitoring, likely due to fatty liver, may need [name] 01/27/2017   Anxiety    Back pain    Chronic otitis media after insertion of tympanic ventilation tube, right 07/13/2018   Depression    GERD (gastroesophageal reflux disease)    High blood sugar    Hyperlipidemia    Hypertension    Obstructive sleep apnea syndrome 01/22/2017   Prediabetes    Sleep apnea    Swallowing difficulty      Social History   Tobacco Use   Smoking status: Some Days    Types: Cigarettes   Smokeless tobacco: Never   Tobacco comments:    Social smoker, 1 pack per week  Substance Use Topics   Alcohol use: Yes    Alcohol/week: 4.0 standard drinks of alcohol    Types: 4 Shots of liquor per week    Comment: socially   Drug use: No    Past Surgical History:  Procedure Laterality Date   HERNIA  REPAIR      Family History  Problem Relation Age of Onset   Breast cancer Mother    Hypertension Mother    Obesity Mother    Heart disease Father    Hypertension Father    Hyperlipidemia Father    Sudden death Father    Prostate cancer Neg Hx    Colon cancer Neg Hx     Allergies  Allergen Reactions   Clarithromycin Rash    Current Medications:   Current Outpatient Medications:    esomeprazole (NEXIUM) 40 MG capsule, Take 1 capsule (40 mg total) by mouth daily., Disp: 10 capsule, Rfl: 0   olmesartan-hydrochlorothiazide (BENICAR HCT) 40-25 MG tablet, Take 1 tablet by mouth daily., Disp: 90 tablet, Rfl: 1   amLODipine (NORVASC) 5 MG tablet, TAKE 1 TABLET DAILY, Disp: 90 tablet, Rfl: 3   atorvastatin (LIPITOR) 20 MG tablet, Take 1 tablet (20 mg total) by mouth daily., Disp: 90 tablet, Rfl: 1   metFORMIN (GLUCOPHAGE-XR) 500 MG 24 hr tablet, Take 1 tablet (500 mg total) by mouth at bedtime., Disp: 90 tablet, Rfl: 1   tirzepatide (MOUNJARO) 5 MG/0.5ML Pen, Inject 5 mg into the skin once a week., Disp: 6 mL, Rfl: 1   Review of Systems:   Review of Systems  Psychiatric/Behavioral:  Positive  for memory loss (minor).   Negative unless otherwise specified per HPI.  Vitals:   Vitals:   07/07/23 0902  BP: 124/77  Pulse: 74  Temp: (!) 97.1 F (36.2 C)  TempSrc: Temporal  SpO2: 96%  Weight: 239 lb 12.8 oz (108.8 kg)  Height: 6' (1.829 m)     Body mass index is 32.52 kg/m.  Physical Exam:   Physical Exam Vitals and nursing note reviewed.  Constitutional:      General: He is not in acute distress.    Appearance: He is well-developed. He is not ill-appearing or toxic-appearing.  Cardiovascular:     Rate and Rhythm: Normal rate and regular rhythm.     Pulses: Normal pulses.     Heart sounds: Normal heart sounds, S1 normal and S2 normal.  Pulmonary:     Effort: Pulmonary effort is normal.     Breath sounds: Normal breath sounds.  Skin:    General: Skin is warm and dry.   Neurological:     Mental Status: He is alert.     GCS: GCS eye subscore is 4. GCS verbal subscore is 5. GCS motor subscore is 6.  Psychiatric:        Speech: Speech normal.        Behavior: Behavior normal. Behavior is cooperative.    Results for orders placed or performed in visit on 07/07/23  POCT HgB A1C  Result Value Ref Range   Hemoglobin A1C 5.5 4.0 - 5.6 %   HbA1c POC (<> result, manual entry)     HbA1c, POC (prediabetic range)     HbA1c, POC (controlled diabetic range)      Assessment and Plan:   Type 2 diabetes mellitus with hyperglycemia, without long-term current use of insulin (HCC); Long-term current use of injectable noninsulin antidiabetic medication; Diabetes mellitus treated with oral medication (HCC) Great improvement Continue metformin 500 mg XR daily and 5 mg Mounjaro weekly -- does not want to increase Will also check B-12 due to chronic metformin use Follow-up in 3 months with Primary Care Provider (PCP)  Essential hypertension Normotensive Continue 5 mg Amlodipine and 40-25 mg Benicar-hydrochlorothiazide Follow-up in 3 months with Primary Care Provider (PCP)   Dyslipidemia Update blood work today and adjust Lipitor 20 mg as indicated Requesting calcium score -- this has been ordered under Primary Care Provider (PCP)    Darlina Guys Lagle,acting as a scribe for Jarold Motto, PA.,have documented all relevant documentation on the behalf of Jarold Motto, PA,as directed by  Jarold Motto, PA while in the presence of Jarold Motto, [place].  I, Jarold Motto, [place], have reviewed all documentation for this visit. The documentation on 07/07/23 for the exam, diagnosis, procedures, and orders are all accurate and complete.  Jarold Motto, PA-C

## 2023-07-07 NOTE — Addendum Note (Signed)
Addended by: Lorn Junes on: 07/07/2023 02:03 PM   Modules accepted: Orders

## 2023-07-07 NOTE — Patient Instructions (Addendum)
It was great to see you!  Keep up the good work!  Follow-up with Dr Jimmey Ralph in 3 months  Take care,  Jarold Motto PA-C

## 2023-07-11 ENCOUNTER — Ambulatory Visit: Payer: Medicare Other | Admitting: Family Medicine

## 2023-07-12 ENCOUNTER — Other Ambulatory Visit: Payer: Self-pay | Admitting: *Deleted

## 2023-07-12 DIAGNOSIS — D72829 Elevated white blood cell count, unspecified: Secondary | ICD-10-CM

## 2023-07-12 NOTE — Procedures (Signed)
[facility]  HOME SLEEP TEST (Watch PAT) REPORT  STUDY DATE: 07/05/23  DOB: [birth year]  MRN: [id number]  ORDERING CLINICIAN: [name], MD, PhD   REFERRING CLINICIAN: Ardith Dark, MD   CLINICAL INFORMATION/HISTORY: 72 year old male with an underlying medical history of hypertension, hyperlipidemia, prediabetes, back pain, anxiety, depression, reflux disease, and obesity, who presents for reevaluation of his obstructive sleep apnea.  He has been on CPAP therapy of 7 cm with EPR of 3 with excellent compliance and good apnea control, he should be eligible for new equipment.   Epworth sleepiness score: 6/24.  BMI: 32.8 kg/m  FINDINGS:   Sleep Summary:   Total Recording Time (hours, min): 8 hours, 35 min  Total Sleep Time (hours, min):  6 hours, 19 min  Percent REM (%):    14%   Respiratory Indices:   Calculated pAHI (per hour):  37.2/hour         REM pAHI:    27.4/hour       NREM pAHI: 38.8/hour  Central pAHI: 2.7/hour  Oxygen Saturation Statistics:    Oxygen Saturation (%) Mean: 93%   Minimum oxygen saturation (%):                 84%   O2 Saturation Range (%): 84-98%    O2 Saturation (minutes) <=88%: 2.2 min  Pulse Rate Statistics:   Pulse Mean (bpm):    63/min    Pulse Range (50-89/min)   IMPRESSION: OSA (obstructive sleep apnea)  RECOMMENDATION:  This home sleep test demonstrates severe obstructive sleep apnea with a total AHI of 37.2/hour and O2 nadir of 84%.  Snoring was detected, in the mild to moderate range.  Ongoing treatment with positive airway pressure is highly recommended. The patient has been compliant with his CPAP of 7 cm with EPR of 3 with good tolerance and good apnea control.  He should be eligible for a new machine which I would like to prescribe, maintaining current settings with a mask of choice, sized to fit. A laboratory attended titration study can be considered in the future for optimization of treatment  settings and to improve tolerance and compliance. Alternative treatment options are limited secondary to the severity of the patient's sleep disordered breathing, but may include surgical treatment with an implantable hypoglossal nerve stimulator (in carefully selected candidates, meeting criteria).  Concomitant weight loss is recommended, where clinically appropriate. Please note, that untreated obstructive sleep apnea may carry additional perioperative morbidity. Patients with significant obstructive sleep apnea should receive perioperative PAP therapy and the surgeons and particularly the anesthesiologist should be informed of the diagnosis and the severity of the sleep disordered breathing. The patient should be cautioned not to drive, work at heights, or operate dangerous or heavy equipment when tired or sleepy. Review and reiteration of good sleep hygiene measures should be pursued with any patient. Other causes of the patient's symptoms, including circadian rhythm disturbances, an underlying mood disorder, medication effect and/or an underlying medical problem cannot be ruled out based on this test. Clinical correlation is recommended.  The patient and his referring provider will be notified of the test results. The patient will be seen in follow up in sleep clinic at [facility]  I certify that I have reviewed the raw data recording prior to the issuance of this report in accordance with the standards of the American Academy of Sleep Medicine (AASM).  INTERPRETING PHYSICIAN:   [name], MD, PhD Medical Director, [facility] (  [facility]) Diplomat, ABPN (Neurology and Sleep)   [facility] [address] [place], [place] [address] [phone]

## 2023-07-12 NOTE — Addendum Note (Signed)
Addended by: [name] on: 07/12/2023 05:20 PM   Modules accepted: Orders

## 2023-07-12 NOTE — Progress Notes (Signed)
His white blood cell count and platelets are little elevated compared to previous.  The rest of his Labs are all stable.  I would like for him to come back to recheck CBC with differential to make sure blood counts are stable.  Please place future order for CBC with differential and peripheral smear.  We can recheck everything else in 6-12 months.

## 2023-07-18 NOTE — Telephone Encounter (Signed)
New, Almon Register, CMA; Carlisle Beers; Malva Limes, Efraim Kaufmann; 1 other Received, thank you!       Previous Messages    ----- Message ----- From: Bobbye Morton, CMA Sent: 07/18/2023   3:07 PM EDT To: Kathyrn [name]; Santina Evans; Kathe Becton; * Subject: cpap                                          New orders have been placed for the above pt, DOB: 12/05/1950 Thanks

## 2023-07-19 ENCOUNTER — Other Ambulatory Visit (INDEPENDENT_AMBULATORY_CARE_PROVIDER_SITE_OTHER): Payer: Medicare Other

## 2023-07-19 ENCOUNTER — Ambulatory Visit (HOSPITAL_COMMUNITY)
Admission: RE | Admit: 2023-07-19 | Discharge: 2023-07-19 | Disposition: A | Discharge status: Home / Self Care | Payer: Medicare Other | Source: Ambulatory Visit | Attending: Family Medicine | Admitting: Family Medicine

## 2023-07-19 DIAGNOSIS — E1165 Type 2 diabetes mellitus with hyperglycemia: Secondary | ICD-10-CM

## 2023-07-19 DIAGNOSIS — D72829 Elevated white blood cell count, unspecified: Secondary | ICD-10-CM

## 2023-07-19 LAB — MICROALBUMIN / CREATININE URINE RATIO
Creatinine,U: 151 mg/dL
Microalb Creat Ratio: 0.7 mg/g (ref 0.0–30.0)
Microalb, Ur: 1.1 mg/dL (ref 0.0–1.9)

## 2023-07-20 LAB — CBC WITH DIFFERENTIAL/PLATELET
Absolute Monocytes: 913 cells/uL (ref 200–950)
Basophils Absolute: 44 cells/uL (ref 0–200)
Basophils Relative: 0.4 %
Eosinophils Absolute: 1188 {cells}/uL — ABNORMAL HIGH (ref 15–500)
Eosinophils Relative: 10.8 %
HCT: 40.2 % (ref 38.5–50.0)
Hemoglobin: 13.2 g/dL (ref 13.2–17.1)
Lymphs Abs: 2354 {cells}/uL (ref 850–3900)
MCH: 30.1 pg (ref 27.0–33.0)
MCHC: 32.8 g/dL (ref 32.0–36.0)
MCV: 91.8 fL (ref 80.0–100.0)
MPV: 9.6 fL (ref 7.5–12.5)
Monocytes Relative: 8.3 %
Neutro Abs: 6501 {cells}/uL (ref 1500–7800)
Neutrophils Relative %: 59.1 %
Platelets: 377 10*3/uL (ref 140–400)
RBC: 4.38 10*6/uL (ref 4.20–5.80)
RDW: 14.4 % (ref 11.0–15.0)
Total Lymphocyte: 21.4 %
WBC: 11 10*3/uL — ABNORMAL HIGH (ref 3.8–10.8)

## 2023-07-20 LAB — PATHOLOGIST SMEAR REVIEW

## 2023-07-21 ENCOUNTER — Encounter: Payer: Self-pay | Admitting: Family Medicine

## 2023-07-21 NOTE — Progress Notes (Signed)
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  This has been going on for several years he has actually seen infectious disease for this in the past.  They did not recommend any further follow-up.  Since his labs are stable we can just continue to monitor for now and recheck in a year.

## 2023-07-26 NOTE — Progress Notes (Signed)
His cardiac calcium score shows elevated numbers well above expected for his age.  This is not an urgent concern however we should be more aggressive with his risk factor modification.  Recommend increasing his Lipitor to 40 mg daily and referring him to see cardiology.

## 2023-07-27 ENCOUNTER — Other Ambulatory Visit: Payer: Self-pay | Admitting: *Deleted

## 2023-07-27 DIAGNOSIS — R931 Abnormal findings on diagnostic imaging of heart and coronary circulation: Secondary | ICD-10-CM

## 2023-07-27 DIAGNOSIS — E785 Hyperlipidemia, unspecified: Secondary | ICD-10-CM

## 2023-07-27 MED ORDER — ATORVASTATIN CALCIUM 20 MG PO TABS
40.0000 mg | ORAL_TABLET | Freq: Every day | ORAL | Status: DC
Start: 2023-07-27 — End: 2023-10-19

## 2023-07-27 NOTE — Progress Notes (Signed)
cardiolo

## 2023-08-03 ENCOUNTER — Ambulatory Visit: Payer: Medicare Other | Admitting: Family Medicine

## 2023-08-10 ENCOUNTER — Encounter (HOSPITAL_BASED_OUTPATIENT_CLINIC_OR_DEPARTMENT_OTHER): Payer: Self-pay | Admitting: Nurse Practitioner

## 2023-08-10 ENCOUNTER — Ambulatory Visit (HOSPITAL_BASED_OUTPATIENT_CLINIC_OR_DEPARTMENT_OTHER): Payer: Medicare Other | Admitting: Nurse Practitioner

## 2023-08-10 VITALS — BP 122/60 | HR 72 | Ht 72.0 in | Wt 235.0 lb

## 2023-08-10 DIAGNOSIS — Z720 Tobacco use: Secondary | ICD-10-CM

## 2023-08-10 DIAGNOSIS — I10 Essential (primary) hypertension: Secondary | ICD-10-CM

## 2023-08-10 DIAGNOSIS — E785 Hyperlipidemia, unspecified: Secondary | ICD-10-CM | POA: Diagnosis not present

## 2023-08-10 DIAGNOSIS — I2583 Coronary atherosclerosis due to lipid rich plaque: Secondary | ICD-10-CM | POA: Diagnosis not present

## 2023-08-10 DIAGNOSIS — R0609 Other forms of dyspnea: Secondary | ICD-10-CM | POA: Diagnosis not present

## 2023-08-10 DIAGNOSIS — I2510 Atherosclerotic heart disease of native coronary artery without angina pectoris: Secondary | ICD-10-CM | POA: Diagnosis not present

## 2023-08-10 MED ORDER — ASPIRIN 81 MG PO TBEC: 81.0000 mg | DELAYED_RELEASE_TABLET | Freq: Every day | ORAL | Status: AC

## 2023-08-10 MED ORDER — METOPROLOL TARTRATE 50 MG PO TABS: ORAL_TABLET | ORAL | 0 refills | Status: DC

## 2023-08-10 NOTE — Progress Notes (Addendum)
Cardiology Office Note:  .   Date:  08/10/2023 ID:  Roy Avila, DOB 10-06-1951, MRN [id number] PCP: Ardith Dark, MD [facility] Providers Cardiologist:  None   Patient Profile: .      PMH Elevated coronary calcium score 2310 (94th percentile) Dyslipidemia Type 2 DM Hypertension GERD Family history CAD Father died at age 71 from heart problems       History of Present Illness: .   Roy Avila is a very pleasant 72 y.o. male  who is here today for new patient consult for elevated coronary calcium score. He reports his daughter is a Scientist, clinical (histocompatibility and immunogenetics) and encouraged him to undergo coronary calcium score for risk assessment. He is on [facility] [employer] [facility] and has recently lost 25 pounds.  He is generally eating less, but admits he does not like vegetables. Eats a lot of sandwiches. A1C on 07/07/23 was 5.5. Admits he has not been very active since the beginning of summer due to the heat.  He was previously walking about 2 miles daily at a good pace. He notes shortness of breath with walking up inclines. Feels this is worsened over the last few months. Is a social smoker, smokes when drinking Etoh 2 x per week at most.  He has been on atorvastatin for some time but it was increased following CT. Is traveling to [place] at the end of the month on a river cruise, will be gone 2 weeks.  He denies chest pain, orthopnea, PND, palpitations, edema, presyncope, syncope.  Family history: His family history includes Breast cancer in his mother; Heart disease in his father; Hyperlipidemia in his father; Hypertension in his father and mother; Obesity in his mother; Sudden death in his father.   ASCVD Risk Score:  RESULT SUMMARY: 41.0 % Risk of cardiovascular event (coronary or stroke death or non-fatal MI or stroke) in next 10 years.   INPUTS: History of ASCVD --> 0 = No LDL Cholesterol >=190mg /dL (7.82 mmol/L) --> 0 = No Age --> 72 years Diabetes --> 1 = Yes Sex --> 1 = Male Total Cholesterol --> 111  mg/dL HDL Cholesterol --> 31 mg/dL Systolic Blood Pressure --> 140 mm Hg Treatment for Hypertension --> 1 = Yes Smoker --> 0 = No Race --> 1 = White   Diet: Eating less food since starting Mounjaro Breakfast - a shake with a banana Lunch - usually a sandwich Dinner - lean protein, does not eat a lot of vegetables  Activity: Was waling about 2 miles prior to hot weather, not active recently  ROS: See HPI       Studies Reviewed: Marland Kitchen   EKG Interpretation Date/Time:  Wednesday August 10 2023 13:39:50 EDT Ventricular Rate:  72 PR Interval:  184 QRS Duration:  96 QT Interval:  396 QTC Calculation: 433 R Axis:   -7  Text Interpretation: Normal sinus rhythm Normal ECG No previous ECGs available No ST abnormality Confirmed by Eligha Bridegroom [phone]) on 08/10/2023 1:42:49 PM     Coronary arteries: Normal origins.   Coronary Calcium Score:   Left main: 220   Left anterior descending artery: 1092   Left circumflex artery: 32   Right coronary artery: 966   Total: 2310   Percentile: 94   Pericardium: Normal.   Ascending Aorta: Normal caliber.   Pulmonary artery: Dilated main PA measuring 31mm   Non-cardiac: See separate report from [facility].   IMPRESSION: Coronary calcium score of 2310. This was 94th percentile for age-, race-, and sex-matched controls.  Dilated main pulmonary artery measuring 31mm  Risk Assessment/Calculations:            Physical Exam:   VS: BP 122/60   Pulse 72   Ht 6' (1.829 m)   Wt 235 lb (106.6 kg)   BMI 31.87 kg/m   Wt Readings from Last 3 Encounters:  08/10/23 235 lb (106.6 kg)  07/07/23 239 lb 12.8 oz (108.8 kg)  06/15/23 242 lb (109.8 kg)     GEN: Obese, well developed in no acute distress NECK: No JVD; No carotid bruits CARDIAC: RRR, no murmurs, rubs, gallops RESPIRATORY:  Clear to auscultation without rales, wheezing or rhonchi  ABDOMEN: Soft, non-tender, non-distended EXTREMITIES:  No edema; No deformity      ASSESSMENT AND PLAN: .    CAD: CT calcium score with Agatston score 2310 on 08/02/23. ASCVD risk score is 41%.  He was walking approximately 2 miles several months ago but admits to not being active during the summer months.  He has shortness of breath on exertion.  No chest pain. Additional risk factors of hypertension, age, smoking, and family history. We will get coronary CTA for mapping of coronary anatomy.  Will have him take Lopressor 50 mg 2 hours prior to test.  I encouraged him to start aspirin 81 mg daily due to significant coronary calcium and upcoming travel plans. Cholesterol is well-controlled with LDL 49.  Will see him back in 2 months. If CTA does not reveal concerning obstruction, will emphasize secondary prevention with diet, exercise, and lifestyle modification.  Addendum: Coronary CTA with positive FFR concerning for obstruction. Risks of cardiac catheterization reviewed and he has agreed to proceed. He is on aspirin and statin.   DOE: He is having dyspnea on exertion, more noticeable recently when he has been less active.  Volume status is difficult to assess due to body habitus. He denies orthopnea, PND, edema.  Echo 02/2021 showed normal heart function. Consider repeat echo if symptoms persist and CT is unrevealing.   Essential hypertension: BP is well controlled.   Dyslipidemia: LDL 49 on 07/07/23.  Continue atorvastatin.  Tobacco use: He smokes 1-2 times per week when drinking Etoh. Complete cessation advised.   Informed Consent   Shared Decision Making/Informed Consent The risks [stroke (1 in 1000), death (1 in 1000), kidney failure [usually temporary] (1 in 500), bleeding (1 in 200), allergic reaction [possibly serious] (1 in 200)], benefits (diagnostic support and management of coronary artery disease) and alternatives of a cardiac catheterization were discussed in detail with Roy Avila and he is willing to proceed.      Plan/Goals: Work on reducing intake of simple  carbohydrates Smoking cessation advised  Activity: Resume walking as tolerated, aim for 30 minutes at least 5 days per week. Notify us or seek immediate medical attention if you have worsening symptoms of DOE or chest pain.         Dispo: 2 months with me  Signed, Eligha Bridegroom, NP-C

## 2023-08-10 NOTE — Patient Instructions (Signed)
Medication Instructions:   Your physician recommends that you continue on your current medications as directed. Please refer to the Current Medication list given to you today.   *If you need a refill on your cardiac medications before your next appointment, please call your pharmacy*   Lab Work:  None ordered.  If you have labs (blood work) drawn today and your tests are completely normal, you will receive your results only by: MyChart Message (if you have MyChart) OR A paper copy in the mail If you have any lab test that is abnormal or we need to change your treatment, we will call you to review the results.   Testing/Procedures:    Your cardiac CT will be scheduled at one of the below locations:   [facility] [address] [place], [place] [address] [phone]   If scheduled at [facility], please arrive at the [facility] and Children's Entrance (Entrance C2) of [facility] 30 minutes prior to test start time. You can use the FREE valet parking offered at entrance C (encouraged to control the heart rate for the test)  Proceed to the [facility] Radiology Department (first floor) to check-in and test prep.  All radiology patients and guests should use entrance C2 at [facility], accessed from [facility], even though the hospital's physical address listed is [address].    There is spacious parking and easy access to the radiology department from the [facility] entrance. Please enter here and check-in with the desk attendant.   Please follow these instructions carefully (unless otherwise directed):  An IV will be required for this test and Nitroglycerin will be given.  Hold all erectile dysfunction medications at least 3 days (72 hrs) prior to test. (Ie viagra, cialis, sildenafil, tadalafil, etc)   On the Night Before the Test: Be sure to Drink plenty of water. Do not consume any  caffeinated/decaffeinated beverages or chocolate 12 hours prior to your test. Do not take any antihistamines 12 hours prior to your test.   On the Day of the Test: Drink plenty of water until 1 hour prior to the test. Do not eat any food 1 hour prior to test. You may take your regular medications prior to the test.  Take metoprolol (Lopressor) one tablet by mouth ( 50 mg) two hours prior to test. If you take olmesartan-hydrochlorothiazide  please HOLD on the morning of the test.      After the Test: Drink plenty of water. After receiving IV contrast, you may experience a mild flushed feeling. This is normal. On occasion, you may experience a mild rash up to 24 hours after the test. This is not dangerous. If this occurs, you can take Benadryl 25 mg and increase your fluid intake. If you experience trouble breathing, this can be serious. If it is severe call 911 IMMEDIATELY. If it is mild, please call our office. If you take any of these medications: Metformin  please do not take 48 hours after completing test unless otherwise instructed.  We will call to schedule your test 2-4 weeks out understanding that some insurance companies will need an authorization prior to the service being performed.   For more information and frequently asked questions, please visit our website : [email or web address]  For non-scheduling related questions, please contact the cardiac imaging nurse navigator should you have any questions/concerns: Cardiac Imaging Nurse Navigators Direct Office Dial: [phone]   For scheduling needs, including cancellations  and rescheduling, please call [place], [phone].    Follow-Up: At [facility], you and your health needs are our priority.  As part of our continuing mission to provide you with exceptional heart care, we have created designated Provider Care Teams.  These Care Teams include your primary Cardiologist (physician) and Advanced  Practice Providers (APPs -  Physician Assistants and Nurse Practitioners) who all work together to provide you with the care you need, when you need it.  We recommend signing up for the patient portal called "MyChart".  Sign up information is provided on this After Visit Summary.  MyChart is used to connect with patients for Virtual Visits (Telemedicine).  Patients are able to view lab/test results, encounter notes, upcoming appointments, etc.  Non-urgent messages can be sent to your provider as well.   To learn more about what you can do with MyChart, go to [email or web address].    Your next appointment:   2 month(s)  Provider:   Eligha Bridegroom, NP    Other Instructions

## 2023-08-10 NOTE — H&P (View-Only) (Signed)
Cardiology Office Note:  .   Date:  08/10/2023 ID:  Roy Avila, DOB 10-06-1951, MRN [id number] PCP: Ardith Dark, MD [facility] Providers Cardiologist:  None   Patient Profile: .      PMH Elevated coronary calcium score 2310 (94th percentile) Dyslipidemia Type 2 DM Hypertension GERD Family history CAD Father died at age 71 from heart problems       History of Present Illness: .   Roy Avila is a very pleasant 72 y.o. male  who is here today for new patient consult for elevated coronary calcium score. He reports his daughter is a Scientist, clinical (histocompatibility and immunogenetics) and encouraged him to undergo coronary calcium score for risk assessment. He is on [facility] [employer] and has recently lost 25 pounds.  He is generally eating less, but admits he does not like vegetables. Eats a lot of sandwiches. A1C on 07/07/23 was 5.5. Admits he has not been very active since the beginning of summer due to the heat.  He was previously walking about 2 miles daily at a good pace. He notes shortness of breath with walking up inclines. Feels this is worsened over the last few months. Is a social smoker, smokes when drinking Etoh 2 x per week at most.  He has been on atorvastatin for some time but it was increased following CT. Is traveling to [place] at the end of the month on a river cruise, will be gone 2 weeks.  He denies chest pain, orthopnea, PND, palpitations, edema, presyncope, syncope.  Family history: His family history includes Breast cancer in his mother; Heart disease in his father; Hyperlipidemia in his father; Hypertension in his father and mother; Obesity in his mother; Sudden death in his father.   ASCVD Risk Score:  RESULT SUMMARY: 41.0 % Risk of cardiovascular event (coronary or stroke death or non-fatal MI or stroke) in next 10 years.   INPUTS: History of ASCVD --> 0 = No LDL Cholesterol >=190mg /dL (7.82 mmol/L) --> 0 = No Age --> 72 years Diabetes --> 1 = Yes Sex --> 1 = Male Total Cholesterol --> 111  mg/dL HDL Cholesterol --> 31 mg/dL Systolic Blood Pressure --> 140 mm Hg Treatment for Hypertension --> 1 = Yes Smoker --> 0 = No Race --> 1 = White   Diet: Eating less food since starting Moun[employer]o Breakfast - a shake with a banana Lunch - usually a sandwich Dinner - lean protein, does not eat a lot of vegetables  Activity: Was waling about 2 miles prior to hot weather, not active recently  ROS: See HPI       Studies Reviewed: Marland Kitchen   EKG Interpretation Date/Time:  Wednesday August 10 2023 13:39:50 EDT Ventricular Rate:  72 PR Interval:  184 QRS Duration:  96 QT Interval:  396 QTC Calculation: 433 R Axis:   -7  Text Interpretation: Normal sinus rhythm Normal ECG No previous ECGs available No ST abnormality Confirmed by Eligha Bridegroom [phone]) on 08/10/2023 1:42:49 PM     Coronary arteries: Normal origins.   Coronary Calcium Score:   Left main: 220   Left anterior descending artery: 1092   Left circumflex artery: 32   Right coronary artery: 966   Total: 2310   Percentile: 94   Pericardium: Normal.   Ascending Aorta: Normal caliber.   Pulmonary artery: Dilated main PA measuring 31mm   Non-cardiac: See separate report from [facility].   IMPRESSION: Coronary calcium score of 2310. This was 94th percentile for age-, race-, and sex-matched controls.  Dilated main pulmonary artery measuring 31mm  Risk Assessment/Calculations:            Physical Exam:   VS: BP 122/60   Pulse 72   Ht 6' (1.829 m)   Wt 235 lb (106.6 kg)   BMI 31.87 kg/m   Wt Readings from Last 3 Encounters:  08/10/23 235 lb (106.6 kg)  07/07/23 239 lb 12.8 oz (108.8 kg)  06/15/23 242 lb (109.8 kg)     GEN: Obese, well developed in no acute distress NECK: No JVD; No carotid bruits CARDIAC: RRR, no murmurs, rubs, gallops RESPIRATORY:  Clear to auscultation without rales, wheezing or rhonchi  ABDOMEN: Soft, non-tender, non-distended EXTREMITIES:  No edema; No deformity      ASSESSMENT AND PLAN: .    CAD: CT calcium score with Agatston score 2310 on 08/02/23. ASCVD risk score is 41%.  He was walking approximately 2 miles several months ago but admits to not being active during the summer months.  He has shortness of breath on exertion.  No chest pain. Additional risk factors of hypertension, age, smoking, and family history. We will get coronary CTA for mapping of coronary anatomy.  Will have him take Lopressor 50 mg 2 hours prior to test.  I encouraged him to start aspirin 81 mg daily due to significant coronary calcium and upcoming travel plans. Cholesterol is well-controlled with LDL 49.  Will see him back in 2 months. If CTA does not reveal concerning obstruction, will emphasize secondary prevention with diet, exercise, and lifestyle modification.  Addendum: Coronary CTA with positive FFR concerning for obstruction. Risks of cardiac catheterization reviewed and he has agreed to proceed. He is on aspirin and statin.   DOE: He is having dyspnea on exertion, more noticeable recently when he has been less active.  Volume status is difficult to assess due to body habitus. He denies orthopnea, PND, edema.  Echo 02/2021 showed normal heart function. Consider repeat echo if symptoms persist and CT is unrevealing.   Essential hypertension: BP is well controlled.   Dyslipidemia: LDL 49 on 07/07/23.  Continue atorvastatin.  Tobacco use: He smokes 1-2 times per week when drinking Etoh. Complete cessation advised.   Informed Consent   Shared Decision Making/Informed Consent The risks [stroke (1 in 1000), death (1 in 1000), kidney failure [usually temporary] (1 in 500), bleeding (1 in 200), allergic reaction [possibly serious] (1 in 200)], benefits (diagnostic support and management of coronary artery disease) and alternatives of a cardiac catheterization were discussed in detail with Roy Avila and he is willing to proceed.      Plan/Goals: Work on reducing intake of simple  carbohydrates Smoking cessation advised  Activity: Resume walking as tolerated, aim for 30 minutes at least 5 days per week. Notify us or seek immediate medical attention if you have worsening symptoms of DOE or chest pain.         Dispo: 2 months with me  Signed, Eligha Bridegroom, NP-C

## 2023-08-11 ENCOUNTER — Encounter (HOSPITAL_COMMUNITY): Payer: Self-pay

## 2023-08-16 ENCOUNTER — Encounter (HOSPITAL_COMMUNITY): Payer: Self-pay

## 2023-08-18 ENCOUNTER — Ambulatory Visit (HOSPITAL_COMMUNITY)
Admission: RE | Admit: 2023-08-18 | Discharge: 2023-08-18 | Disposition: A | Discharge status: Home / Self Care | Payer: Medicare Other | Source: Ambulatory Visit | Attending: Cardiovascular Disease | Admitting: Cardiovascular Disease

## 2023-08-18 ENCOUNTER — Ambulatory Visit (HOSPITAL_COMMUNITY)
Admission: RE | Admit: 2023-08-18 | Discharge: 2023-08-18 | Disposition: A | Discharge status: Home / Self Care | Payer: Medicare Other | Source: Ambulatory Visit | Attending: Nurse Practitioner | Admitting: Nurse Practitioner

## 2023-08-18 VITALS — BP 133/77

## 2023-08-18 DIAGNOSIS — R931 Abnormal findings on diagnostic imaging of heart and coronary circulation: Secondary | ICD-10-CM | POA: Insufficient documentation

## 2023-08-18 DIAGNOSIS — I2583 Coronary atherosclerosis due to lipid rich plaque: Secondary | ICD-10-CM | POA: Insufficient documentation

## 2023-08-18 DIAGNOSIS — I10 Essential (primary) hypertension: Secondary | ICD-10-CM | POA: Diagnosis present

## 2023-08-18 DIAGNOSIS — R0609 Other forms of dyspnea: Secondary | ICD-10-CM | POA: Insufficient documentation

## 2023-08-18 DIAGNOSIS — I2510 Atherosclerotic heart disease of native coronary artery without angina pectoris: Secondary | ICD-10-CM | POA: Diagnosis present

## 2023-08-18 MED ORDER — NITROGLYCERIN 0.4 MG SL SUBL
SUBLINGUAL_TABLET | SUBLINGUAL | Status: AC
  Filled 2023-08-18: qty 2

## 2023-08-18 MED ORDER — NITROGLYCERIN 0.4 MG SL SUBL
0.8000 mg | SUBLINGUAL_TABLET | Freq: Once | SUBLINGUAL | Status: AC
  Administered 2023-08-18: 0.8 mg via SUBLINGUAL

## 2023-08-18 MED ORDER — IOHEXOL 350 MG/ML SOLN
95.0000 mL | Freq: Once | INTRAVENOUS | Status: AC | PRN
  Administered 2023-08-18: 95 mL via INTRAVENOUS

## 2023-08-19 ENCOUNTER — Other Ambulatory Visit: Payer: Self-pay | Admitting: *Deleted

## 2023-08-19 ENCOUNTER — Telehealth: Payer: Self-pay | Admitting: Nurse Practitioner

## 2023-08-19 ENCOUNTER — Encounter: Payer: Self-pay | Admitting: *Deleted

## 2023-08-19 DIAGNOSIS — Z79899 Other long term (current) drug therapy: Secondary | ICD-10-CM

## 2023-08-19 LAB — POCT I-STAT CREATININE: Creatinine, Ser: 0.9 mg/dL (ref 0.61–1.24)

## 2023-08-19 MED ORDER — METOPROLOL TARTRATE 25 MG PO TABS: 12.5000 mg | ORAL_TABLET | Freq: Two times a day (BID) | ORAL | 3 refills | Status: DC

## 2023-08-19 NOTE — Telephone Encounter (Signed)
Pt would like a callback regarding test results. Please advise

## 2023-08-19 NOTE — Addendum Note (Signed)
Addended by: Levi Aland on: 08/19/2023 06:06 PM   Modules accepted: Orders

## 2023-08-19 NOTE — Telephone Encounter (Signed)
FYI he is calling on his CT from yesterday. I anticipate he probably read the MyChart report. Looks like he might need LHC. Let me know if I can help!  Alver Sorrow, NP

## 2023-08-19 NOTE — Addendum Note (Signed)
Addended by: Burnetta Sabin on: 08/19/2023 01:30 PM   Modules accepted: Orders

## 2023-08-19 NOTE — Telephone Encounter (Signed)
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 Results reviewed with patient via telephone. Risks of cardiac catheterization were reviewed and he would like to proceed. Will ask Elliot Cousin, RMA to schedule left heart cath for early next week (Tuesday or Wednesday).  He is aware he will need to come in for lab work on Monday.  Consent for procedure is in patient's office note from 08/10/23.

## 2023-08-19 NOTE — Telephone Encounter (Signed)
Pt has been made aware that we have his procedure scheduled.  Verbally went over instructions with pt and am also sending a copy via mychart.           Cardiac/Peripheral Catheterization   You are scheduled for a Cardiac Catheterization on Wednesday, September 25 with Dr. Peter [place].  1. Please arrive at the [facility] (Main Entrance A) at [facility]: [address] [place], [place] [address] at 8:00 AM (This time is 2 hour(s) before your procedure to ensure your preparation). Free valet parking service is available. You will check in at ADMITTING. The support person will be asked to wait in the waiting room.  It is OK to have someone drop you off and come back when you are ready to be discharged.        Special note: Every effort is made to have your procedure done on time. Please understand that emergencies sometimes delay scheduled procedures.  2. Diet: Do not eat solid foods after midnight.  You may have clear liquids until 5 AM the day of the procedure.  3. Labs: You will need to have blood drawn on MONDAY SEPTEMBER 23 AT THE [facility]  4. Medication instructions in preparation for your procedure:   Contrast Allergy: No    Stop taking, Benicar (Olmesartan) Monday, 08/22/23 will be your last dose until after your procedure     Do not take Diabetes Med Glucophage (Metformin) on the day of the procedure and HOLD 48 HOURS AFTER THE PROCEDURE.  On the morning of your procedure, take Aspirin 81 mg and any morning medicines NOT listed above.  You may use sips of water.  5. Plan to go home the same day, you will only stay overnight if medically necessary. 6. You MUST have a responsible adult to drive you home. 7. An adult MUST be with you the first 24 hours after you arrive home. 8. Bring a current list of your medications, and the last time and date medication taken. 9. Bring ID and current insurance cards. 10.Please wear clothes that are easy to get on and  off and wear slip-on shoes.  Thank you for allowing [name] to care for you!   --  Invasive Cardiovascular services

## 2023-08-23 ENCOUNTER — Telehealth: Payer: Self-pay | Admitting: *Deleted

## 2023-08-23 LAB — BASIC METABOLIC PANEL
BUN/Creatinine Ratio: 14 (ref 10–24)
BUN: 14 mg/dL (ref 8–27)
CO2: 26 mmol/L (ref 20–29)
Calcium: 9.6 mg/dL (ref 8.6–10.2)
Chloride: 98 mmol/L (ref 96–106)
Creatinine, Ser: 1.01 mg/dL (ref 0.76–1.27)
Glucose: 95 mg/dL (ref 70–99)
Potassium: 3.8 mmol/L (ref 3.5–5.2)
Sodium: 138 mmol/L (ref 134–144)
eGFR: 80 mL/min/{1.73_m2} (ref >59)

## 2023-08-23 LAB — CBC
Hematocrit: 43.4 % (ref 37.5–51.0)
Hemoglobin: 14.2 g/dL (ref 13.0–17.7)
MCH: 30.3 pg (ref 26.6–33.0)
MCHC: 32.7 g/dL (ref 31.5–35.7)
MCV: 93 fL (ref 79–97)
Platelets: 445 10*3/uL (ref 150–450)
RBC: 4.69 x10E6/uL (ref 4.14–5.80)
RDW: 14.1 % (ref 11.6–15.4)
WBC: 14.2 10*3/uL — ABNORMAL HIGH (ref 3.4–10.8)

## 2023-08-23 NOTE — Telephone Encounter (Addendum)
Cardiac Catheterization scheduled at [facility] for: Wednesday August 24, 2023 10 AM Arrival time [facility] Main Entrance A at: 8 AM  Nothing to eat after midnight prior to procedure, clear liquids until 5 AM day of procedure.  Medication instructions: -Hold:  Metformin-day of procedure and 48 hours post procedure  Olmesartan-hydrochlorothiazide-AM of procedure   Mounjaro-weekly on Thursdays-last dose 08/18/23 -Other usual morning medications can be taken with sips of water including aspirin 81 mg.  Plan to go home the same day, you will only stay overnight if medically necessary.  You must have responsible adult to drive you home.  Someone must be with you the first 24 hours after you arrive home.  Reviewed procedure instructions with patient, pt denies any signs/symptoms of infection (08/22/23 WBC 14.2).

## 2023-08-24 ENCOUNTER — Encounter (HOSPITAL_COMMUNITY)
Admission: RE | Disposition: A | Discharge status: Home / Self Care | Payer: Self-pay | Source: Ambulatory Visit | Attending: Cardiology

## 2023-08-24 ENCOUNTER — Other Ambulatory Visit: Payer: Self-pay | Admitting: Cardiology

## 2023-08-24 ENCOUNTER — Ambulatory Visit (HOSPITAL_COMMUNITY)
Admission: RE | Admit: 2023-08-24 | Discharge: 2023-08-24 | Disposition: A | Discharge status: Home / Self Care | Payer: Medicare Other | Source: Ambulatory Visit | Attending: Cardiology | Admitting: Cardiology

## 2023-08-24 ENCOUNTER — Other Ambulatory Visit: Payer: Self-pay

## 2023-08-24 DIAGNOSIS — Z79899 Other long term (current) drug therapy: Secondary | ICD-10-CM | POA: Diagnosis not present

## 2023-08-24 DIAGNOSIS — E119 Type 2 diabetes mellitus without complications: Secondary | ICD-10-CM | POA: Diagnosis not present

## 2023-08-24 DIAGNOSIS — Z8249 Family history of ischemic heart disease and other diseases of the circulatory system: Secondary | ICD-10-CM | POA: Diagnosis not present

## 2023-08-24 DIAGNOSIS — E785 Hyperlipidemia, unspecified: Secondary | ICD-10-CM | POA: Diagnosis not present

## 2023-08-24 DIAGNOSIS — Z7985 Long-term (current) use of injectable non-insulin antidiabetic drugs: Secondary | ICD-10-CM | POA: Diagnosis not present

## 2023-08-24 DIAGNOSIS — I2510 Atherosclerotic heart disease of native coronary artery without angina pectoris: Secondary | ICD-10-CM

## 2023-08-24 DIAGNOSIS — I10 Essential (primary) hypertension: Secondary | ICD-10-CM | POA: Insufficient documentation

## 2023-08-24 DIAGNOSIS — R0609 Other forms of dyspnea: Secondary | ICD-10-CM | POA: Diagnosis present

## 2023-08-24 DIAGNOSIS — F17200 Nicotine dependence, unspecified, uncomplicated: Secondary | ICD-10-CM | POA: Diagnosis not present

## 2023-08-24 DIAGNOSIS — K219 Gastro-esophageal reflux disease without esophagitis: Secondary | ICD-10-CM | POA: Diagnosis not present

## 2023-08-24 DIAGNOSIS — Z7982 Long term (current) use of aspirin: Secondary | ICD-10-CM | POA: Diagnosis not present

## 2023-08-24 DIAGNOSIS — I2584 Coronary atherosclerosis due to calcified coronary lesion: Secondary | ICD-10-CM | POA: Diagnosis not present

## 2023-08-24 HISTORY — PX: LEFT HEART CATH AND CORONARY ANGIOGRAPHY: CATH118249

## 2023-08-24 LAB — GLUCOSE, CAPILLARY: Glucose-Capillary: 103 mg/dL — ABNORMAL HIGH (ref 70–99)

## 2023-08-24 SURGERY — LEFT HEART CATH AND CORONARY ANGIOGRAPHY
Anesthesia: LOCAL

## 2023-08-24 MED ORDER — FENTANYL CITRATE (PF) 100 MCG/2ML IJ SOLN
INTRAMUSCULAR | Status: DC | PRN
  Administered 2023-08-24: 25 ug via INTRAVENOUS

## 2023-08-24 MED ORDER — VERAPAMIL HCL 2.5 MG/ML IV SOLN
INTRAVENOUS | Status: AC
  Filled 2023-08-24: qty 2

## 2023-08-24 MED ORDER — ASPIRIN 81 MG PO CHEW: 81.0000 mg | CHEWABLE_TABLET | ORAL | Status: DC

## 2023-08-24 MED ORDER — SODIUM CHLORIDE 0.9 % WEIGHT BASED INFUSION: 1.0000 mL/kg/h | INTRAVENOUS | Status: DC

## 2023-08-24 MED ORDER — HEPARIN SODIUM (PORCINE) 1000 UNIT/ML IJ SOLN
INTRAMUSCULAR | Status: DC | PRN
  Administered 2023-08-24: 5000 [IU] via INTRAVENOUS

## 2023-08-24 MED ORDER — IOHEXOL 350 MG/ML SOLN
INTRAVENOUS | Status: DC | PRN
  Administered 2023-08-24: 80 mL via INTRA_ARTERIAL

## 2023-08-24 MED ORDER — SODIUM CHLORIDE 0.9 % IV SOLN: 250.0000 mL | INTRAVENOUS | Status: DC | PRN

## 2023-08-24 MED ORDER — MIDAZOLAM HCL 2 MG/2ML IJ SOLN
INTRAMUSCULAR | Status: AC
  Filled 2023-08-24: qty 2

## 2023-08-24 MED ORDER — SODIUM CHLORIDE 0.9 % WEIGHT BASED INFUSION
3.0000 mL/kg/h | INTRAVENOUS | Status: AC
  Administered 2023-08-24: 3 mL/kg/h via INTRAVENOUS

## 2023-08-24 MED ORDER — METFORMIN HCL ER 500 MG PO TB24: 500.0000 mg | ORAL_TABLET | Freq: Every day | ORAL | Status: DC

## 2023-08-24 MED ORDER — HEPARIN SODIUM (PORCINE) 1000 UNIT/ML IJ SOLN
INTRAMUSCULAR | Status: AC
  Filled 2023-08-24: qty 10

## 2023-08-24 MED ORDER — HYDRALAZINE HCL 20 MG/ML IJ SOLN: 10.0000 mg | INTRAMUSCULAR | Status: DC | PRN

## 2023-08-24 MED ORDER — SODIUM CHLORIDE 0.9% FLUSH: 3.0000 mL | INTRAVENOUS | Status: DC | PRN

## 2023-08-24 MED ORDER — ONDANSETRON HCL 4 MG/2ML IJ SOLN: 4.0000 mg | Freq: Four times a day (QID) | INTRAMUSCULAR | Status: DC | PRN

## 2023-08-24 MED ORDER — ACETAMINOPHEN 325 MG PO TABS: 650.0000 mg | ORAL_TABLET | ORAL | Status: DC | PRN

## 2023-08-24 MED ORDER — FENTANYL CITRATE (PF) 100 MCG/2ML IJ SOLN
INTRAMUSCULAR | Status: AC
  Filled 2023-08-24: qty 2

## 2023-08-24 MED ORDER — SODIUM CHLORIDE 0.9% FLUSH: 3.0000 mL | Freq: Two times a day (BID) | INTRAVENOUS | Status: DC

## 2023-08-24 MED ORDER — VERAPAMIL HCL 2.5 MG/ML IV SOLN
INTRAVENOUS | Status: DC | PRN
  Administered 2023-08-24: 10 mL via INTRA_ARTERIAL

## 2023-08-24 MED ORDER — MIDAZOLAM HCL 2 MG/2ML IJ SOLN
INTRAMUSCULAR | Status: DC | PRN
  Administered 2023-08-24: 1 mg via INTRAVENOUS

## 2023-08-24 MED ORDER — HEPARIN (PORCINE) IN NACL 1000-0.9 UT/500ML-% IV SOLN
INTRAVENOUS | Status: DC | PRN
  Administered 2023-08-24 (×2): 500 mL

## 2023-08-24 MED ORDER — LIDOCAINE HCL (PF) 1 % IJ SOLN
INTRAMUSCULAR | Status: DC | PRN
  Administered 2023-08-24: 2 mL

## 2023-08-24 MED ORDER — LIDOCAINE HCL (PF) 1 % IJ SOLN
INTRAMUSCULAR | Status: AC
  Filled 2023-08-24: qty 30

## 2023-08-24 SURGICAL SUPPLY — 8 items
CATH 5FR JL3.5 JR4 ANG PIG MP (CATHETERS) IMPLANT
DEVICE RAD COMP TR BAND LRG (VASCULAR PRODUCTS) IMPLANT
GLIDESHEATH SLEND SS 6F .021 (SHEATH) IMPLANT
GUIDEWIRE INQWIRE 1.5J.035X260 (WIRE) IMPLANT
INQWIRE 1.5J .035X260CM (WIRE) ×1
KIT PV (KITS) IMPLANT
PACK CARDIAC CATHETERIZATION (CUSTOM PROCEDURE TRAY) ×1 IMPLANT
TRANSDUCER W/STOPCOCK (MISCELLANEOUS) IMPLANT

## 2023-08-24 NOTE — Discharge Instructions (Signed)

## 2023-08-24 NOTE — Interval H&P Note (Signed)
History and Physical Interval Note:  08/24/2023 9:36 AM  Francene Boyers  has presented today for surgery, with the diagnosis of abnormal cardiac ct.  The various methods of treatment have been discussed with the patient and family. After consideration of risks, benefits and other options for treatment, the patient has consented to  Procedure(s): LEFT HEART CATH AND CORONARY ANGIOGRAPHY (N/A) as a surgical intervention.  The patient's history has been reviewed, patient examined, no change in status, stable for surgery.  I have reviewed the patient's chart and labs.  Questions were answered to the patient's satisfaction.   Cath Lab Visit (complete for each Cath Lab visit)  Clinical Evaluation Leading to the Procedure:   ACS: No.  Non-ACS:    Anginal Classification: CCS I  Anti-ischemic medical therapy: Maximal Therapy (2 or more classes of medications)  Non-Invasive Test Results: High-risk stress test findings: cardiac mortality >3%/year  Prior CABG: No previous CABG        Theron Arista [facility] 08/24/2023 9:36 AM

## 2023-08-24 NOTE — Progress Notes (Signed)
TR band off at 1220, gauze dressing applied. Right radial, level 0, clean, dry, and intact. Patient walked to the bathroom without difficulties.

## 2023-08-25 ENCOUNTER — Encounter (HOSPITAL_COMMUNITY): Payer: Self-pay | Admitting: Cardiology

## 2023-08-29 ENCOUNTER — Encounter (HOSPITAL_BASED_OUTPATIENT_CLINIC_OR_DEPARTMENT_OTHER): Payer: Self-pay

## 2023-09-01 ENCOUNTER — Encounter (HOSPITAL_BASED_OUTPATIENT_CLINIC_OR_DEPARTMENT_OTHER): Payer: Self-pay

## 2023-09-01 NOTE — Telephone Encounter (Signed)
Here ya go ladies!

## 2023-09-08 ENCOUNTER — Ambulatory Visit (HOSPITAL_COMMUNITY): Payer: Medicare Other | Attending: Cardiology

## 2023-09-08 DIAGNOSIS — I2583 Coronary atherosclerosis due to lipid rich plaque: Secondary | ICD-10-CM | POA: Diagnosis not present

## 2023-09-08 DIAGNOSIS — I2510 Atherosclerotic heart disease of native coronary artery without angina pectoris: Secondary | ICD-10-CM

## 2023-09-08 LAB — ECHOCARDIOGRAM COMPLETE
Area-P 1/2: 2.6 cm2
S' Lateral: 2.7 cm

## 2023-09-08 NOTE — Progress Notes (Signed)
[address]       [place] [address]             [phone]        Roy Avila [facility] Medical Record #[id number] Date of Birth: 02-19-1951  Referring: [place], Peter M, MD Primary Care: Ardith Dark, MD Primary Cardiologist:None  Chief Complaint:    Chief Complaint  Patient presents with   Coronary Artery Disease    New patient consultation, CATH 9/25, ECHO 10/10    History of Present Illness:     Roy Avila 72 y.o. 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  This was prompted by a coronary calcium CT due to a strong family history of heart disease.  He has been asymptomatic from an anginal standpoint.  He denies any chest pain, reflux, or nausea.  He only has some shortness of breath when he exerts himself, but is able to walk a mile before he experiences this.  He has smoked up to the last 2 weeks.     Past Medical and Surgical History: Previous Chest Surgery: no Previous Chest Radiation: no Diabetes Mellitus: yes.  HbA1C 5.5 Creatinine:  Lab Results  Component Value Date   CREATININE 1.01 08/22/2023   CREATININE 0.90 08/18/2023   CREATININE 0.72 07/07/2023     Past Medical History:  Diagnosis Date   Abnormal LFTs (liver function tests), monitoring, likely due to fatty liver, may need [name] 01/27/2017   Anxiety    Back pain    Chronic otitis media after insertion of tympanic ventilation tube, right 07/13/2018   Depression    GERD (gastroesophageal reflux disease)    High blood sugar    Hyperlipidemia    Hypertension    Obstructive sleep apnea syndrome 01/22/2017   Prediabetes    Sleep apnea    Swallowing difficulty     Past Surgical History:  Procedure Laterality Date   HERNIA REPAIR     LEFT HEART CATH AND CORONARY ANGIOGRAPHY N/A 08/24/2023   Procedure: LEFT HEART CATH AND CORONARY ANGIOGRAPHY;  Surgeon: [place], Peter M, MD;  [facility];  Service: Cardiovascular;  Laterality: N/A;    Social  History:  Social History   Tobacco Use  Smoking Status Some Days   Types: Cigarettes  Smokeless Tobacco Never  Tobacco Comments   Social smoker, 1 pack per week    Social History   Substance and Sexual Activity  Alcohol Use Yes   Alcohol/week: 4.0 standard drinks of alcohol   Types: 4 Shots of liquor per week   Comment: socially     Allergies  Allergen Reactions   Clarithromycin Rash    Medications:   Current Outpatient Medications  Medication Sig Dispense Refill   amLODipine (NORVASC) 5 MG tablet TAKE 1 TABLET DAILY 90 tablet 3   aspirin EC 81 MG tablet Take 1 tablet (81 mg total) by mouth daily. Swallow whole.     atorvastatin (LIPITOR) 20 MG tablet Take 2 tablets (40 mg total) by mouth daily.     Clobetasol Propionate 0.05 % shampoo Apply 1 Application topically every other day.     doxycycline (VIBRAMYCIN) 100 MG capsule Take 100 mg by mouth daily.     esomeprazole (NEXIUM) 40 MG capsule Take 1 capsule (40 mg total) by mouth daily. 10 capsule 0   metFORMIN (GLUCOPHAGE-XR) 500 MG 24 hr tablet Take 1 tablet (500 mg total) by mouth at bedtime.  metoprolol tartrate (LOPRESSOR) 25 MG tablet Take 0.5 tablets (12.5 mg total) by mouth 2 (two) times daily. 90 tablet 3   olmesartan-hydrochlorothiazide (BENICAR HCT) 40-25 MG tablet Take 1 tablet by mouth daily. 90 tablet 1   tirzepatide (MOUNJARO) 5 MG/0.5ML Pen Inject 5 mg into the skin once a week. 6 mL 1   No current facility-administered medications for this visit.    (Not in a hospital admission)   Family History  Problem Relation Age of Onset   Breast cancer Mother    Hypertension Mother    Obesity Mother    Heart disease Father    Hypertension Father    Hyperlipidemia Father    Sudden death Father    Prostate cancer Neg Hx    Colon cancer Neg Hx      Review of Systems:   Review of Systems  Constitutional:  Positive for malaise/fatigue. Negative for weight loss.  Respiratory:  Positive for shortness  of breath.   Cardiovascular:  Negative for chest pain, palpitations, orthopnea and leg swelling.  Gastrointestinal:  Negative for abdominal pain, heartburn and nausea.  Neurological: Negative.       Physical Exam: BP 133/60 (BP Location: Left Arm, Patient Position: Sitting, Cuff Size: Normal)   Pulse 68   Resp 20   Ht 5\' 11"  (1.803 m)   Wt 235 lb 9.6 oz (106.9 kg)   SpO2 96% Comment: RA  BMI 32.86 kg/m  Physical Exam Constitutional:      General: He is not in acute distress.    Appearance: He is not ill-appearing.  HENT:     Head: Normocephalic and atraumatic.  Eyes:     Extraocular Movements: Extraocular movements intact.  Cardiovascular:     Rate and Rhythm: Normal rate.  Pulmonary:     Effort: Pulmonary effort is normal. No respiratory distress.  Abdominal:     General: There is no distension.  Musculoskeletal:        General: Normal range of motion.     Cervical back: Normal range of motion.  Skin:    General: Skin is warm and dry.  Neurological:     General: No focal deficit present.     Mental Status: He is alert and oriented to person, place, and time.       Diagnostic Studies & Laboratory data:    Left Heart Catherization:  Intervention  Echo: 1. Left ventricular ejection fraction, by estimation, is 60 to 65%. The  left ventricle has normal function. The left ventricle has no regional  wall motion abnormalities. There is mild left ventricular hypertrophy.  Left ventricular diastolic parameters  are consistent with Grade I diastolic dysfunction (impaired relaxation).  The average left ventricular global longitudinal strain is -16.5 %.   2. Right ventricular systolic function is normal. The right ventricular  size is normal.   3. The mitral valve is normal in structure. Trivial mitral valve  regurgitation. No evidence of mitral stenosis.   4. The aortic valve is normal in structure. There is mild calcification  of the aortic valve. Aortic valve  regurgitation is not visualized. Aortic  valve sclerosis is present, with no evidence of aortic valve stenosis.   5. The inferior vena cava is normal in size with greater than 50%  respiratory variability, suggesting right atrial pressure of 3 mmHg.     EKG: Sinus  I have independently reviewed the above radiologic studies and discussed with the patient   Recent Lab Findings: Lab Results  Component  Value Date   WBC 14.2 (H) 08/22/2023   HGB 14.2 08/22/2023   HCT 43.4 08/22/2023   PLT 445 08/22/2023   GLUCOSE 95 08/22/2023   CHOL 111 07/07/2023   TRIG 155.0 (H) 07/07/2023   HDL 31.00 (L) 07/07/2023   LDLDIRECT 54.0 10/28/2022   LDLCALC 49 07/07/2023   ALT 22 07/07/2023   AST 19 07/07/2023   NA 138 08/22/2023   K 3.8 08/22/2023   CL 98 08/22/2023   CREATININE 1.01 08/22/2023   BUN 14 08/22/2023   CO2 26 08/22/2023   TSH 2.46 10/28/2022   HGBA1C 5.5 07/07/2023      Assessment / Plan:   72yo male with 2V CAD.  Echocardiogram shows preserved biventricular function and no significant valvular disease.  Given that he is asymptomatic, there is questionable benefit of surgical revascularization over medical management.  I will discuss his case in Cath conference, and follow-up with him next week.      LAD, D1, D2, PDA   I  spent 40 minutes counseling the patient face to face.   Corliss Skains 09/09/2023 9:55 AM

## 2023-09-09 ENCOUNTER — Encounter: Payer: Self-pay | Admitting: Thoracic Surgery (Cardiothoracic Vascular Surgery)

## 2023-09-09 ENCOUNTER — Institutional Professional Consult (permissible substitution): Payer: Medicare Other | Admitting: Thoracic Surgery (Cardiothoracic Vascular Surgery)

## 2023-09-09 VITALS — BP 133/60 | HR 68 | Resp 20 | Ht 71.0 in | Wt 235.6 lb

## 2023-09-09 DIAGNOSIS — I2510 Atherosclerotic heart disease of native coronary artery without angina pectoris: Secondary | ICD-10-CM

## 2023-09-09 DIAGNOSIS — I2583 Coronary atherosclerosis due to lipid rich plaque: Secondary | ICD-10-CM

## 2023-09-14 ENCOUNTER — Encounter (HOSPITAL_BASED_OUTPATIENT_CLINIC_OR_DEPARTMENT_OTHER): Payer: Self-pay

## 2023-09-16 ENCOUNTER — Ambulatory Visit: Payer: Medicare Other | Admitting: Thoracic Surgery (Cardiothoracic Vascular Surgery)

## 2023-09-16 ENCOUNTER — Ambulatory Visit: Payer: Self-pay | Admitting: Emergency Medicine

## 2023-09-16 DIAGNOSIS — I2583 Coronary atherosclerosis due to lipid rich plaque: Secondary | ICD-10-CM | POA: Diagnosis not present

## 2023-09-16 DIAGNOSIS — I2510 Atherosclerotic heart disease of native coronary artery without angina pectoris: Secondary | ICD-10-CM | POA: Diagnosis not present

## 2023-09-16 NOTE — Heart Team MDD (Signed)
   Heart Team Multi-Disciplinary Discussion  Patient: Roy Avila  DOB: 06-Aug-1951  MRN: [id number]   Date: 09/16/2023  10:46 AM    Attendees: Interventional Cardiology: Verne Carrow, MD Nanetta Batty, MD Lorine Bears, MD Shawnie Pons, MD Alverda Skeans, MD Bryan Lemma, MD Yates Decamp, MD Truett Mainland, MD Yvonne Kendall, MD Tonny Bollman, MD  Cardiothoracic Surgery: Brynda Greathouse, MD     Patient History: 72 y.o. male with c/o SOB with exertion but able to walk up to a mile before experiencing any symptoms. Pt denies CP, denies reflux and denies nausea.     Risk Factors: Hypertension Hyperlipidemia Tobacco Abuse Additional Risk Factors: anxiety, back pain, GERD. OSA, Elevated coronary calcium score 2310 (94th percentile).     Review of Prior Angiography and PCI Procedures: The left heart cath and coronary angiogram images were reviewed and discussed in detail including: complex 2 vessel obstructive CAD with sequential bifurcation lesions in the proximal and mid LAD involving moderate sized first and second diagonal branches. Mid RCA disease and significant calcification.      Discussion: After presentation, consideration of treatment options occurred with heavy discussion on the fact that the patient is active and has indicated that he does not want to have surgery and is fairly asymptomatic. Consensus from the team was for aggressive medical therapy.    Recommendations: Medical Therapy      Alison Murray, RN  09/16/2023 10:46 AM

## 2023-09-16 NOTE — Progress Notes (Signed)
     [address]       [place] [address]             [phone]       Patient: Home Provider: Office Consent for Telemedicine visit obtained.  Today's visit was completed via a real-time telehealth (see specific modality noted below). The patient/authorized person provided oral consent at the time of the visit to engage in a telemedicine encounter with the present provider at [facility]. The patient/authorized person was informed of the potential benefits, limitations, and risks of telemedicine. The patient/authorized person expressed understanding that the laws that protect confidentiality also apply to telemedicine. The patient/authorized person acknowledged understanding that telemedicine does not provide emergency services and that he or she would need to call 911 or proceed to the nearest hospital for help if such a need arose.   Total time spent in the clinical discussion 10 minutes.  Telehealth Modality: Phone visit (audio only)  I had a telephone visit with Mr. Sulewski  I spoke with Mr. Axline and his wife.  Given that he is asymptomatic, the concensus recommendation from the Heart Team meeting was that he should continue with medical management for now.  If he develops symptoms in the future, he will be referred back to [name].  He is agreeable with that plan.  Janayla Marik Keane Scrape

## 2023-09-22 ENCOUNTER — Other Ambulatory Visit: Payer: Self-pay | Admitting: Family Medicine

## 2023-09-22 ENCOUNTER — Other Ambulatory Visit: Payer: Self-pay | Admitting: *Deleted

## 2023-09-22 ENCOUNTER — Telehealth: Payer: Self-pay | Admitting: Family Medicine

## 2023-09-22 MED ORDER — TIRZEPATIDE 5 MG/0.5ML ~~LOC~~ SOAJ: 5.0000 mg | SUBCUTANEOUS | 1 refills | Status: DC

## 2023-09-22 NOTE — Telephone Encounter (Signed)
Rx send to pharmacy  

## 2023-09-22 NOTE — Progress Notes (Signed)
Cardiology Office Note:    Date:  10/04/2023   ID:  Roy Avila, DOB 08-16-1951, MRN [id number]  PCP:  Ardith Dark, MD   [facility] Providers Cardiologist:  None     Referring MD: Ardith Dark, MD   Chief Complaint  Patient presents with   Coronary Artery Disease    History of Present Illness:    Roy Avila is a 72 y.o. male seen for follow up CAD. He has a history of HTN, DM, HLD. Social smoker. Was seen initially in September for evaluation. Had a very high calcium score of 2310. Encouraged to have this done by daughter who is Scientist, clinical (histocompatibility and immunogenetics). He noted some dyspnea walking up incline and coronary CTA done indicating obstructive CAD. This led to cardiac cath showing complex 2 vessel CAD with sequential bifurcation LAD lesions in the proximal and mid LAD and mid RCA. LV function was normal. He was seen in consultation by Dr Cliffton Asters and also discussion at our Heart team meeting. Given minimal symptoms and patient desire to avoid surgery recommendation was to treat medically unless symptoms progressed at which point CABG is best option. Echo was done and is normal.   On follow up today he is doing OK. Denies any chest pain or SOB. Just notes that he gets tired if working or walking more than 1/2 mile. He has lost 30 lbs on Mounjaro since April. Is interested in seeing nutritionist. Is not smoking at all.     Past Medical History:  Diagnosis Date   Abnormal LFTs (liver function tests), monitoring, likely due to fatty liver, may need [name] 01/27/2017   Anxiety    Back pain    Chronic otitis media after insertion of tympanic ventilation tube, right 07/13/2018   Depression    GERD (gastroesophageal reflux disease)    High blood sugar    Hyperlipidemia    Hypertension    Obstructive sleep apnea syndrome 01/22/2017   Prediabetes    Sleep apnea    Swallowing difficulty     Past Surgical History:  Procedure Laterality Date   HERNIA REPAIR     LEFT HEART CATH AND CORONARY  ANGIOGRAPHY N/A 08/24/2023   Procedure: LEFT HEART CATH AND CORONARY ANGIOGRAPHY;  Surgeon: [place], Cage Gupton M, MD;  Location: [facility] INVAS[facility] CV LAB;  Service: Cardiovascular;  Laterality: N/A;    Current Medications: Current Meds  Medication Sig   amLODipine (NORVASC) 5 MG tablet TAKE 1 TABLET DAILY   aspirin EC 81 MG tablet Take 1 tablet (81 mg total) by mouth daily. Swallow whole.   atorvastatin (LIPITOR) 20 MG tablet Take 2 tablets (40 mg total) by mouth daily.   Clobetasol Propionate 0.05 % shampoo Apply 1 Application topically every other day.   doxycycline (VIBRAMYCIN) 100 MG capsule Take 100 mg by mouth daily.   esomeprazole (NEXIUM) 40 MG capsule Take 1 capsule (40 mg total) by mouth daily.   metFORMIN (GLUCOPHAGE-XR) 500 MG 24 hr tablet Take 1 tablet (500 mg total) by mouth at bedtime.   metoprolol succinate (TOPROL XL) 25 MG 24 hr tablet Take 1 tablet (25 mg total) by mouth daily.   nitroGLYCERIN (NITROSTAT) 0.4 MG SL tablet Place 1 tablet (0.4 mg total) under the tongue every 5 (five) minutes as needed for chest pain.   olmesartan-hydrochlorothiazide (BENICAR HCT) 40-25 MG tablet Take 1 tablet by mouth daily.   tirzepatide (MOUNJARO) 5 MG/0.5ML Pen INJECT 5 MG SUBCUTANEOUSLY WEEKLY   [DISCONTINUED] metoprolol tartrate (LOPRESSOR) 25 MG  tablet Take 0.5 tablets (12.5 mg total) by mouth 2 (two) times daily.     Allergies:   Clarithromycin   Social History   Socioeconomic History   Marital status: Married    Spouse name: Inocencio Homes   Number of children: 2   Years of education: college 4   Highest education level: Not on file  Occupational History   Occupation: Retired from [employer]   Occupation: Sales  Tobacco Use   Smoking status: Some Days    Types: Cigarettes   Smokeless tobacco: Never   Tobacco comments:    Social smoker, 1 pack per week  Substance and Sexual Activity   Alcohol use: Yes    Alcohol/week: 4.0 standard drinks of alcohol    Types: 4 Shots of liquor per week     Comment: socially   Drug use: No   Sexual activity: Yes    Partners: Female    Birth control/protection: None  Other Topics Concern   Not on file  Social History Narrative   Not on file   Social Determinants of Health   Financial Resource Strain: Low Risk  (08/10/2023)   Overall Financial Resource Strain (CARDIA)    Difficulty of Paying Living Expenses: Not hard at all  Food Insecurity: No Food Insecurity (08/10/2023)   Hunger Vital Sign    Worried About Running Out of Food in the Last Year: Never true    Ran Out of Food in the Last Year: Never true  Transportation Needs: No Transportation Needs (08/10/2023)   PRAPARE - Administrator, Civil Service (Medical): No    Lack of Transportation (Non-Medical): No  Physical Activity: Inactive (08/10/2023)   Exercise Vital Sign    Days of Exercise per Week: 0 days    Minutes of Exercise per Session: 0 min  Stress: Not on file  Social Connections: Not on file     Family History: The patient's family history includes Breast cancer in his mother; Heart disease in his father; Hyperlipidemia in his father; Hypertension in his father and mother; Obesity in his mother; Sudden death in his father. There is no history of Prostate cancer or Colon cancer.  ROS:   Please see the history of present illness.     All other systems reviewed and are negative.  EKGs/Labs/Other Studies Reviewed:    The following studies were reviewed today: OVER-READ INTERPRETATION  CT CHEST   The following report is an over-read performed by radiologist Dr. Aram Candela of [facility] on 09/06/2023. This over-read does not include interpretation of cardiac or coronary anatomy or pathology. The coronary calcium score/coronary CTA interpretation by the cardiologist is attached.   COMPARISON:  July 19, 2023   FINDINGS: Cardiovascular: There are no significant extracardiac vascular findings.   Mediastinum/Nodes: There are no enlarged  lymph nodes within the visualized mediastinum.   Lungs/Pleura: There is no pleural effusion. Mild lingular and posterior left basilar atelectasis is seen.   Upper abdomen: No significant findings in the visualized upper abdomen.   Musculoskeletal/Chest wall: No chest wall mass or suspicious osseous findings within the visualized chest.   IMPRESSION: No significant extracardiac findings within the visualized chest.     Electronically Signed   By: Aram Candela M.D.   On: 09/06/2023 03:58    Addended by Theophilus Bones, MD on 09/06/2023  4:01 AM    Study Result  Narrative & Impression  CLINICAL DATA:  Chest pain   EXAM: Cardiac CTA   MEDICATIONS:  Sub lingual nitro. 4mg  and lopressor 50mg    TECHNIQUE: The patient was scanned on a Siemens Force 192 slice scanner. Gantry rotation speed was 250 msecs. Collimation was .6 mm. A 120 kV prospective scan was triggered in the ascending thoracic aorta at 140 HU's Full mA was used between 35% and 75% of the R-R interval. Average HR during the scan was 60 bpm. The 3D data set was interpreted on a dedicated work station using MPR, MIP and VRT modes. A total of 80 cc of contrast was used.   FINDINGS: Non-cardiac: See separate report from [facility]. No significant findings on limited lung and soft tissue windows.   Calcium Score: Severe LM and 3 vessel calcium noted   LM 148   LAD 1068   RCA 827   LCX 29.1   Total 2072   Coronary Arteries: Right dominant with no anomalies   LM: 1-24% calcified plaque ostial/proximal   LAD: 25-49% mixed plaque proximally > 70% calcified plaque mid/distal vessel   D1: > 70% calcified plaque proximally   D2: 25-49% calcified plaque proximally > 70% calcified mid bifurcation lesion   D3: Normal   Circumflex: 25-49% calcified plaque proximally   OM1: Normal   OM2: Normal   RCA: > 70% calcified ostial stenosis, 50-69% dense calcified plaque mid vessel 1-24%  calcified plaque distally   PDA: 1-24% calcified plaque proximally   PLA: 1-24% calcified plaque   IMPRESSION: 1. Calcium score 2072 which is 93 rd percentile for age/sex   2.  Normal ascending thoracic aorta 3.1 cm   3. Obstructive CAD in mid RCA/LAD as well as D1 and D2 Study sent for [facility] CT   Roy Avila   Electronically Signed: By: Roy Avila M.D. On: 08/18/2023 16:06       TECHNIQUE: The best systolic and diastolic phases of the patients gated cardiac CTA sent to [facility] for hemodynamic analysis   FINDINGS: RCA positive 0.90 proximally decreasing to 0.76 in mid to distal RCA   LAD positive 0.98 proximally decreasing to 0.73 in mid and 0.69 in distal vessel   D1 positive 0.78   D2: Positive 0.71 in lateral branch and 0.65 in medial branch   LCX negative 0.92   IMPRESSION: Obstructive CAD in mid RCA/LAD including D1 and D2 branches patient should be referred for heart catheterizaiton   Roy Avila     Electronically Signed   By: Roy Avila M.D.   On: 08/18/2023 16:04   LEFT HEART CATH AND CORONARY ANGIOGRAPHY   Conclusion  Complex 2 vessel obstructive CAD. Sequential bifurcation lesions in the proximal and mid LAD involving moderate sized first and second diagonal branches. Mid RCA disease. Significant calcification Normal LV function Mildly elevated LVEDP   Plan: given complexity of disease in a diabetic would recommend referral for CABG. Targets look good.    Diagnostic Dominance: Right  Intervention   Echo 09/08/23:IMPRESSIONS   1. Left ventricular ejection fraction, by estimation, is 60 to 65%. The  left ventricle has normal function. The left ventricle has no regional  wall motion abnormalities. There is mild left ventricular hypertrophy.  Left ventricular diastolic parameters  are consistent with Grade I diastolic dysfunction (impaired relaxation).  The average left ventricular global longitudinal strain is -16.5 %.   2. Right  ventricular systolic function is normal. The right ventricular  size is normal.   3. The mitral valve is normal in structure. Trivial mitral valve  regurgitation. No evidence of mitral stenosis.  4. The aortic valve is normal in structure. There is mild calcification  of the aortic valve. Aortic valve regurgitation is not visualized. Aortic  valve sclerosis is present, with no evidence of aortic valve stenosis.   5. The inferior vena cava is normal in size with greater than 50%  respiratory variability, suggesting right atrial pressure of 3 mmHg.      Recent Labs: 10/28/2022: TSH 2.46 07/07/2023: ALT 22 08/22/2023: BUN 14; Creatinine, Ser 1.01; Hemoglobin 14.2; Platelets 445; Potassium 3.8; Sodium 138  Recent Lipid Panel    Component Value Date/Time   CHOL 111 07/07/2023 0931   CHOL 132 08/20/2021 0757   TRIG 155.0 (H) 07/07/2023 0931   HDL 31.00 (L) 07/07/2023 0931   HDL 28 (L) 08/20/2021 0757   CHOLHDL 4 07/07/2023 0931   VLDL 31.0 07/07/2023 0931   LDLCALC 49 07/07/2023 0931   LDLCALC 79 08/20/2021 0757   LDLDIRECT 54.0 10/28/2022 1408     Risk Assessment/Calculations:                 Physical Exam:    VS:  BP 120/64 (BP Location: Left Arm, Patient Position: Sitting, Cuff Size: Normal)   Pulse 68   Ht 6\' 1"  (1.854 m)   Wt 234 lb (106.1 kg)   SpO2 93%   BMI 30.87 kg/m     Wt Readings from Last 3 Encounters:  10/04/23 234 lb (106.1 kg)  09/09/23 235 lb 9.6 oz (106.9 kg)  08/24/23 237 lb (107.5 kg)     GEN:  Well nourished, well developed in no acute distress HEENT: Normal NECK: No JVD; No carotid bruits LYMPHATICS: No lymphadenopathy CARDIAC: RRR, no murmurs, rubs, gallops. Radial cath site has healed well.  RESPIRATORY:  Clear to auscultation without rales, wheezing or rhonchi  ABDOMEN: Soft, non-tender, non-distended MUSCULOSKELETAL:  No edema; No deformity  SKIN: Warm and dry NEUROLOGIC:  Alert and oriented x 3 PSYCHIATRIC:  Normal affect    ASSESSMENT:    1. Coronary artery disease due to lipid rich plaque   2. Essential hypertension   3. Hyperlipidemia LDL goal <70   4. Type 2 diabetes mellitus without complication, without long-term current use of insulin (HCC)    PLAN:    In order of problems listed above:  CAD with 2 vessel obstructive CAD involving sequential lesions in the proximal and mid LAD involving D1 and D2 and mid RCA disease. Mild symptoms of DOE. Normal EF. Plan medical therapy unless symptoms progress in which case CABG would be favored. Continue medical management with ASA, statin, amlodipine and metoprolol. Follow up in 6 months.  DM type 2 on metformin and Mounjaro. A1c 5.5% in August. Will refer to nutritionist HLD LDL at goal 49. On high dose lipitor 40 mg daily HTN well controlled. Continue current meds Tobacco use. Encourage continued complete smoking cessation.            Medication Adjustments/Labs and Tests Ordered: Current medicines are reviewed at length with the patient today.  Concerns regarding medicines are outlined above.  No orders of the defined types were placed in this encounter.  Meds ordered this encounter  Medications   metoprolol succinate (TOPROL XL) 25 MG 24 hr tablet    Sig: Take 1 tablet (25 mg total) by mouth daily.    Dispense:  90 tablet    Refill:  3   nitroGLYCERIN (NITROSTAT) 0.4 MG SL tablet    Sig: Place 1 tablet (0.4 mg total) under the tongue every  5 (five) minutes as needed for chest pain.    Dispense:  90 tablet    Refill:  3    There are no Patient Instructions on file for this visit.   Signed, Jovante Hammitt [place], MD  10/04/2023 9:36 AM    [facility]

## 2023-09-22 NOTE — Telephone Encounter (Signed)
Continuing prescription.  Roy Avila. Jimmey Ralph, MD 09/22/2023 12:38 PM

## 2023-09-22 NOTE — Telephone Encounter (Signed)
Prescription Request  09/22/2023  LOV: 02/14/2023  What is the name of the medication or equipment?  tirzepatide [facility]) 5 MG/0.5ML Pen    Have you contacted your pharmacy to request a refill? No   Which pharmacy would you like this sent to? [id number] - [place],  - [name] [place] [address] Phone: [phone] Fax: [phone]   Patient notified that their request is being sent to the clinical staff for review and that they should receive a response within 2 business days.   Please advise at Mobile [phone] (mobile)

## 2023-09-22 NOTE — Telephone Encounter (Signed)
Pharmacy comment: Alternative Requested:HAS PATIENT BEEN ON MOUNJARO 2.5MG ?  NEED TO TITRATE.

## 2023-10-04 ENCOUNTER — Ambulatory Visit: Payer: Medicare Other | Attending: Cardiology | Admitting: Cardiology

## 2023-10-04 ENCOUNTER — Encounter: Payer: Self-pay | Admitting: Cardiology

## 2023-10-04 VITALS — BP 120/64 | HR 68 | Ht 73.0 in | Wt 234.0 lb

## 2023-10-04 DIAGNOSIS — I10 Essential (primary) hypertension: Secondary | ICD-10-CM

## 2023-10-04 DIAGNOSIS — I2510 Atherosclerotic heart disease of native coronary artery without angina pectoris: Secondary | ICD-10-CM

## 2023-10-04 DIAGNOSIS — I2583 Coronary atherosclerosis due to lipid rich plaque: Secondary | ICD-10-CM

## 2023-10-04 DIAGNOSIS — E119 Type 2 diabetes mellitus without complications: Secondary | ICD-10-CM | POA: Diagnosis not present

## 2023-10-04 DIAGNOSIS — E785 Hyperlipidemia, unspecified: Secondary | ICD-10-CM

## 2023-10-04 MED ORDER — NITROGLYCERIN 0.4 MG SL SUBL: 0.4000 mg | SUBLINGUAL_TABLET | SUBLINGUAL | 3 refills | Status: AC | PRN

## 2023-10-04 MED ORDER — METOPROLOL SUCCINATE ER 25 MG PO TB24: 25.0000 mg | ORAL_TABLET | Freq: Every day | ORAL | 3 refills | Status: DC

## 2023-10-04 NOTE — Patient Instructions (Signed)
Medication Instructions:  Switched Metoprolol tartrate to metoprolol succinate 25 mg  Continue taking all other medication  *If you need a refill on your cardiac medications before your next appointment, please call your pharmacy*   Lab Work: None needed     Testing/Procedures: None ordered    Follow-Up: At [facility], you and your health needs are our priority.  As part of our continuing mission to provide you with exceptional heart care, we have created designated Provider Care Teams.  These Care Teams include your primary Cardiologist (physician) and Advanced Practice Providers (APPs -  Physician Assistants and Nurse Practitioners) who all work together to provide you with the care you need, when you need it.    Your next appointment:   6 month(s)  Provider:   Dr.Jordan

## 2023-10-17 ENCOUNTER — Encounter: Payer: Self-pay | Admitting: Family Medicine

## 2023-10-17 ENCOUNTER — Ambulatory Visit: Payer: Medicare Other | Admitting: Family Medicine

## 2023-10-17 VITALS — BP 109/70 | HR 64 | Temp 98.0°F | Ht 73.0 in | Wt 241.6 lb

## 2023-10-17 DIAGNOSIS — E785 Hyperlipidemia, unspecified: Secondary | ICD-10-CM | POA: Diagnosis not present

## 2023-10-17 DIAGNOSIS — F419 Anxiety disorder, unspecified: Secondary | ICD-10-CM

## 2023-10-17 DIAGNOSIS — Z7985 Long-term (current) use of injectable non-insulin antidiabetic drugs: Secondary | ICD-10-CM

## 2023-10-17 DIAGNOSIS — E1165 Type 2 diabetes mellitus with hyperglycemia: Secondary | ICD-10-CM

## 2023-10-17 DIAGNOSIS — I2583 Coronary atherosclerosis due to lipid rich plaque: Secondary | ICD-10-CM

## 2023-10-17 DIAGNOSIS — I10 Essential (primary) hypertension: Secondary | ICD-10-CM | POA: Diagnosis not present

## 2023-10-17 DIAGNOSIS — Z7984 Long term (current) use of oral hypoglycemic drugs: Secondary | ICD-10-CM

## 2023-10-17 DIAGNOSIS — I2510 Atherosclerotic heart disease of native coronary artery without angina pectoris: Secondary | ICD-10-CM

## 2023-10-17 MED ORDER — ALPRAZOLAM 0.5 MG PO TABS: 0.5000 mg | ORAL_TABLET | Freq: Two times a day (BID) | ORAL | 0 refills | Status: AC | PRN

## 2023-10-17 NOTE — Assessment & Plan Note (Addendum)
Continue management per cardiology. On aspirin and statin.

## 2023-10-17 NOTE — Progress Notes (Signed)
   Roy Avila is a 72 y.o. male who presents today for an office visit.  Assessment/Plan:  Chronic Problems Addressed Today: Anxiety Database reviewed without red flags.  He does have alprazolam 0.5 mg to use as needed though uses this very infrequently.  His last prescription for 20 pills was given 5 years ago and he still has pills in the pill bottle.  Will refill today.  We did discuss that if he needs to use this with any increasing frequency we should discuss alternatives.  He is aware of potential side effects.  Type 2 diabetes mellitus with hyperglycemia, without long-term current use of insulin (HCC) Last A1c 5.5 three months ago.  Will continue Mounjaro 5 mg weekly and metformin 500 mg daily.  Recheck A1c in 3 to 6 months.  Essential hypertension Blood pressure at goal today with amlodipine 5 mg daily and olmesartan HCTZ 40-25 once daily.  Dyslipidemia Most recent lipid panel at goal.  Continue Lipitor 40 mg daily.  Coronary artery disease due to lipid rich plaque Continue management per cardiology.  On aspirin and statin.       Subjective:  HPI:  See Assessment / plan for status of chronic conditions. HE did have a flare up of his anxiety this past weekend. States that was due to multiple factors including house guests, work being done on his home, and his personal health. He does have alprazolam that he uses very infequently. His last prescription was 5 years ago for 20 pills. He took one dose a few days ago during a flare up of his anxiety and this worked well to alleviate his symptoms. He is not having any side effects.  He feels well at baseline.  He has been doing this for many years without any issues.  We last saw him about 8 months ago.  At that time we started him on [facility].  He did well with this and saw a different provider here about 3 months ago for follow-up visit.  A1c at that time was 5.5.  A cardiac CT scan was ordered which did show significant  calcifications.  He was subsequently referred to cardiology.  He underwent cardiac catheterization which showed complex two-vessel disease and referred to cardiovascular surgery to discuss CABG.  Given his minimal symptoms, the decision was made to treat medically at this point.  He saw cardiology a couple of weeks ago.        Objective:  Physical Exam: BP 109/70   Pulse 64   Temp 98 F (36.7 C) (Temporal)   Ht 6\' 1"  (1.854 m)   Wt 241 lb 9.6 oz (109.6 kg)   SpO2 100%   BMI 31.88 kg/m   Wt Readings from Last 3 Encounters:  10/17/23 241 lb 9.6 oz (109.6 kg)  10/04/23 234 lb (106.1 kg)  09/09/23 235 lb 9.6 oz (106.9 kg)    Gen: No acute distress, resting comfortably Neuro: Grossly normal, moves all extremities Psych: Normal affect and thought content      Meryl Hubers M. Jimmey Ralph, MD 10/17/2023 11:34 AM

## 2023-10-17 NOTE — Assessment & Plan Note (Addendum)
Most recent lipid panel at goal.  Continue Lipitor 40 mg daily.

## 2023-10-17 NOTE — Patient Instructions (Signed)
It was very nice to see you today!  I will refill your medication.  Return in about 6 months (around 04/15/2024) for Annual Physical.   Take care, Dr Jimmey Ralph  PLEASE NOTE:  If you had any lab tests, please let us know if you have not heard back within a few days. You may see your results on mychart before we have a chance to review them but we will give you a call once they are reviewed by [name].   If we ordered any referrals today, please let us know if you have not heard from their office within the next week.   If you had any urgent prescriptions sent in today, please check with the pharmacy within an hour of our visit to make sure the prescription was transmitted appropriately.   Please try these tips to maintain a healthy lifestyle:  Eat at least 3 REAL meals and 1-2 snacks per day.  Aim for no more than 5 hours between eating.  If you eat breakfast, please do so within one hour of getting up.   Each meal should contain half fruits/vegetables, one quarter protein, and one quarter carbs (no bigger than a computer mouse)  Cut down on sweet beverages. This includes juice, soda, and sweet tea.   Drink at least 1 glass of water with each meal and aim for at least 8 glasses per day  Exercise at least 150 minutes every week.

## 2023-10-17 NOTE — Assessment & Plan Note (Signed)
Blood pressure at goal today with amlodipine 5 mg daily and olmesartan HCTZ 40-25 once daily.

## 2023-10-17 NOTE — Assessment & Plan Note (Signed)
Last A1c 5.5 three months ago.  Will continue Mounjaro 5 mg weekly and metformin 500 mg daily.  Recheck A1c in 3 to 6 months.

## 2023-10-17 NOTE — Assessment & Plan Note (Signed)
Database reviewed without red flags.  He does have alprazolam 0.5 mg to use as needed though uses this very infrequently.  His last prescription for 20 pills was given 5 years ago and he still has pills in the pill bottle.  Will refill today.  We did discuss that if he needs to use this with any increasing frequency we should discuss alternatives.  He is aware of potential side effects.

## 2023-10-19 ENCOUNTER — Other Ambulatory Visit: Payer: Self-pay | Admitting: *Deleted

## 2023-10-19 DIAGNOSIS — E785 Hyperlipidemia, unspecified: Secondary | ICD-10-CM

## 2023-10-19 MED ORDER — ATORVASTATIN CALCIUM 40 MG PO TABS: 40.0000 mg | ORAL_TABLET | Freq: Every day | ORAL | 3 refills | Status: DC

## 2023-10-26 ENCOUNTER — Ambulatory Visit (HOSPITAL_BASED_OUTPATIENT_CLINIC_OR_DEPARTMENT_OTHER): Payer: Medicare Other | Admitting: Nurse Practitioner

## 2023-11-01 ENCOUNTER — Telehealth: Payer: Self-pay | Admitting: *Deleted

## 2023-11-01 NOTE — Telephone Encounter (Signed)
LVM for pt per Lebron Conners. Pt does NOT have to keep appointment tomorrow with her. Pt has seen Dr. Thomasene Lot recently. Appt canceled.

## 2023-11-02 ENCOUNTER — Encounter (HOSPITAL_BASED_OUTPATIENT_CLINIC_OR_DEPARTMENT_OTHER): Payer: Self-pay

## 2023-11-02 ENCOUNTER — Ambulatory Visit (HOSPITAL_BASED_OUTPATIENT_CLINIC_OR_DEPARTMENT_OTHER): Payer: Medicare Other | Admitting: Nurse Practitioner

## 2023-11-24 ENCOUNTER — Other Ambulatory Visit: Payer: Self-pay | Admitting: Family Medicine

## 2023-11-24 DIAGNOSIS — I10 Essential (primary) hypertension: Secondary | ICD-10-CM

## 2023-12-12 ENCOUNTER — Other Ambulatory Visit: Payer: Self-pay | Admitting: *Deleted

## 2023-12-12 ENCOUNTER — Ambulatory Visit: Payer: Medicare Other | Admitting: Dietician

## 2023-12-12 DIAGNOSIS — I10 Essential (primary) hypertension: Secondary | ICD-10-CM

## 2023-12-12 MED ORDER — AMLODIPINE BESYLATE 5 MG PO TABS: ORAL_TABLET | ORAL | 3 refills | Status: AC

## 2023-12-19 ENCOUNTER — Other Ambulatory Visit: Payer: Self-pay | Admitting: *Deleted

## 2023-12-19 DIAGNOSIS — I10 Essential (primary) hypertension: Secondary | ICD-10-CM

## 2023-12-19 MED ORDER — ATORVASTATIN CALCIUM 40 MG PO TABS: 40.0000 mg | ORAL_TABLET | Freq: Every day | ORAL | 1 refills | Status: DC

## 2023-12-19 MED ORDER — ESOMEPRAZOLE MAGNESIUM 40 MG PO CPDR: 40.0000 mg | DELAYED_RELEASE_CAPSULE | Freq: Every day | ORAL | 1 refills | Status: DC

## 2023-12-19 MED ORDER — OLMESARTAN MEDOXOMIL-HCTZ 40-25 MG PO TABS: 1.0000 | ORAL_TABLET | Freq: Every day | ORAL | 1 refills | Status: DC

## 2023-12-19 MED ORDER — METFORMIN HCL ER 500 MG PO TB24: 500.0000 mg | ORAL_TABLET | Freq: Every day | ORAL | 1 refills | Status: DC

## 2023-12-27 ENCOUNTER — Other Ambulatory Visit: Payer: Self-pay

## 2023-12-27 ENCOUNTER — Telehealth: Payer: Self-pay

## 2023-12-27 MED ORDER — TIRZEPATIDE 7.5 MG/0.5ML ~~LOC~~ SOAJ: 7.5000 mg | SUBCUTANEOUS | 0 refills | Status: DC

## 2023-12-27 NOTE — Telephone Encounter (Signed)
Copied from CRM [id number]. Topic: Clinical - Medication Question >> Dec 27, 2023  9:36 AM Almira Coaster wrote: Reason for CRM: Patient is calling regarding his tirzepatide [facility]) 5 MG/0.5ML Pen prescription. Patient thinks it's time to go up on the dosage as the one he is on now seems not to be affective.  Please see msg above from pt call and please advise on dose increase of Mounjaro or needing to schedule an appointment to discuss.

## 2023-12-27 NOTE — Telephone Encounter (Signed)
Rx sent to pharmacy per PCP recommendations; LVM for pt with prescription information on dose change and advising to call or schedule visit via MyChart soon for Diabetes follow up/A1C check

## 2023-12-27 NOTE — Telephone Encounter (Signed)
Ok to go to 7.5 mg daily and have him come in soon for A1c check.  Katina Degree. Jimmey Ralph, MD 12/27/2023 12:59 PM

## 2023-12-30 ENCOUNTER — Telehealth: Payer: Self-pay

## 2023-12-30 NOTE — Telephone Encounter (Signed)
Copied from CRM [id number]. Topic: Clinical - Request for Lab/Test Order >> Dec 30, 2023 10:17 AM Isabell A wrote: Reason for CRM: Patient scheduled for labs on Monday 2/3, no orders on file for A1C check.  Per notes patient should be scheduling a visit with PCP for follow up and A1C check; not lab only appt; please call patient to schedule

## 2024-01-02 ENCOUNTER — Other Ambulatory Visit: Payer: Medicare Other

## 2024-01-03 ENCOUNTER — Ambulatory Visit: Payer: Medicare Other | Admitting: Family Medicine

## 2024-01-03 ENCOUNTER — Encounter: Payer: Self-pay | Admitting: Family Medicine

## 2024-01-03 VITALS — BP 113/58 | HR 68 | Temp 97.5°F | Ht 73.0 in | Wt 235.0 lb

## 2024-01-03 DIAGNOSIS — E1165 Type 2 diabetes mellitus with hyperglycemia: Secondary | ICD-10-CM | POA: Diagnosis not present

## 2024-01-03 DIAGNOSIS — I2583 Coronary atherosclerosis due to lipid rich plaque: Secondary | ICD-10-CM | POA: Diagnosis not present

## 2024-01-03 DIAGNOSIS — I2510 Atherosclerotic heart disease of native coronary artery without angina pectoris: Secondary | ICD-10-CM

## 2024-01-03 DIAGNOSIS — Z7984 Long term (current) use of oral hypoglycemic drugs: Secondary | ICD-10-CM

## 2024-01-03 DIAGNOSIS — Z7985 Long-term (current) use of injectable non-insulin antidiabetic drugs: Secondary | ICD-10-CM

## 2024-01-03 LAB — POCT GLYCOSYLATED HEMOGLOBIN (HGB A1C): Hemoglobin A1C: 5.6 % (ref 4.0–5.6)

## 2024-01-03 NOTE — Assessment & Plan Note (Signed)
Following with cardiology.  Lipids were at goal.  He is on aspirin and statin.  We are adjusting his Mounjaro as above.  A1c is at goal.

## 2024-01-03 NOTE — Progress Notes (Signed)
   Roy Avila is a 73 y.o. male who presents today for an office visit.  Assessment/Plan:  Chronic Problems Addressed Today: Type 2 diabetes mellitus with hyperglycemia, without long-term current use of insulin  (HCC) A1 stable 5.6.  He is doing well with Mounjaro  7.5 mg weekly.  He does note that his weight has plateaued the last couple of months.  We will continue his Mounjaro  and metformin  500 mg daily.  He will follow-up with me in 6 months for CPE and we can recheck A1c at that time.  He will send a message via MyChart in a few weeks to let us  know how he is doing with Mounjaro  we can titrate as needed.  Coronary artery disease due to lipid rich plaque Following with cardiology.  Lipids were at goal.  He is on aspirin  and statin.  We are adjusting his Mounjaro  as above.  A1c is at goal.     Subjective:  HPI:  See Assessment / plan for status of chronic conditions.  Patient is here today for follow-up.  I saw him a couple of months ago.  At that time was doing well on Mounjaro  5 mg weekly and metformin  500 mg daily. He did notice that he was not losing much weight and request we increase the dose He is here today to follow-up on A1c.       Objective:  Physical Exam: BP (!) 113/58   Pulse 68   Temp (!) 97.5 F (36.4 C) (Temporal)   Ht 6' 1 (1.854 m)   Wt 235 lb (106.6 kg)   SpO2 96%   BMI 31.00 kg/m   Wt Readings from Last 3 Encounters:  01/03/24 235 lb (106.6 kg)  10/17/23 241 lb 9.6 oz (109.6 kg)  10/04/23 234 lb (106.1 kg)    Gen: No acute distress, resting comfortably Neuro: Grossly normal, moves all extremities Psych: Normal affect and thought content      Tamikia Chowning M. Kennyth, MD 01/03/2024 8:30 AM

## 2024-01-03 NOTE — Patient Instructions (Addendum)
 It was very nice to see you today!  We will continue with your current medications.  Sending a message in a few weeks to let me know how this is working for you.  We can adjust the dose as needed.  Return in about 6 months (around 07/02/2024) for Annual Physical.   Take care, Dr Kennyth  PLEASE NOTE:  If you had any lab tests, please let us  know if you have not heard back within a few days. You may see your results on mychart before we have a chance to review them but we will give you a call once they are reviewed by us .   If we ordered any referrals today, please let us  know if you have not heard from their office within the next week.   If you had any urgent prescriptions sent in today, please check with the pharmacy within an hour of our visit to make sure the prescription was transmitted appropriately.   Please try these tips to maintain a healthy lifestyle:  Eat at least 3 REAL meals and 1-2 snacks per day.  Aim for no more than 5 hours between eating.  If you eat breakfast, please do so within one hour of getting up.   Each meal should contain half fruits/vegetables, one quarter protein, and one quarter carbs (no bigger than a computer mouse)  Cut down on sweet beverages. This includes juice, soda, and sweet tea.   Drink at least 1 glass of water with each meal and aim for at least 8 glasses per day  Exercise at least 150 minutes every week.

## 2024-01-03 NOTE — Assessment & Plan Note (Signed)
 A1 stable 5.6.  He is doing well with Mounjaro  7.5 mg weekly.  He does note that his weight has plateaued the last couple of months.  We will continue his Mounjaro  and metformin  500 mg daily.  He will follow-up with me in 6 months for CPE and we can recheck A1c at that time.  He will send a message via MyChart in a few weeks to let us  know how he is doing with Mounjaro  we can titrate as needed.

## 2024-01-25 ENCOUNTER — Encounter (HOSPITAL_BASED_OUTPATIENT_CLINIC_OR_DEPARTMENT_OTHER): Payer: Self-pay

## 2024-01-25 ENCOUNTER — Emergency Department (HOSPITAL_BASED_OUTPATIENT_CLINIC_OR_DEPARTMENT_OTHER): Payer: Medicare Other | Admitting: Radiology

## 2024-01-25 ENCOUNTER — Other Ambulatory Visit: Payer: Self-pay

## 2024-01-25 DIAGNOSIS — R0602 Shortness of breath: Secondary | ICD-10-CM | POA: Diagnosis not present

## 2024-01-25 DIAGNOSIS — R1010 Upper abdominal pain, unspecified: Secondary | ICD-10-CM | POA: Diagnosis present

## 2024-01-25 DIAGNOSIS — Z5321 Procedure and treatment not carried out due to patient leaving prior to being seen by health care provider: Secondary | ICD-10-CM | POA: Diagnosis not present

## 2024-01-25 DIAGNOSIS — R531 Weakness: Secondary | ICD-10-CM | POA: Diagnosis not present

## 2024-01-25 DIAGNOSIS — R5383 Other fatigue: Secondary | ICD-10-CM | POA: Diagnosis not present

## 2024-01-25 LAB — BASIC METABOLIC PANEL
Anion gap: 14 (ref 5–15)
BUN: 14 mg/dL (ref 8–23)
CO2: 25 mmol/L (ref 22–32)
Calcium: 10.3 mg/dL (ref 8.9–10.3)
Chloride: 98 mmol/L (ref 98–111)
Creatinine, Ser: 0.83 mg/dL (ref 0.61–1.24)
GFR, Estimated: >60 mL/min (ref >60)
Glucose, Bld: 115 mg/dL — ABNORMAL HIGH (ref 70–99)
Potassium: 3.6 mmol/L (ref 3.5–5.1)
Sodium: 137 mmol/L (ref 135–145)

## 2024-01-25 LAB — CBC
HCT: 42.9 % (ref 39.0–52.0)
Hemoglobin: 14.7 g/dL (ref 13.0–17.0)
MCH: 30.3 pg (ref 26.0–34.0)
MCHC: 34.3 g/dL (ref 30.0–36.0)
MCV: 88.5 fL (ref 80.0–100.0)
Platelets: 390 10*3/uL (ref 150–400)
RBC: 4.85 MIL/uL (ref 4.22–5.81)
RDW: 14.7 % (ref 11.5–15.5)
WBC: 12.6 10*3/uL — ABNORMAL HIGH (ref 4.0–10.5)
nRBC: 0 % (ref 0.0–0.2)

## 2024-01-25 LAB — RESP PANEL BY RT-PCR (RSV, FLU A&B, COVID)  RVPGX2
Influenza A by PCR: NEGATIVE
Influenza B by PCR: NEGATIVE
Resp Syncytial Virus by PCR: NEGATIVE
SARS Coronavirus 2 by RT PCR: NEGATIVE

## 2024-01-25 LAB — TROPONIN I (HIGH SENSITIVITY): Troponin I (High Sensitivity): 4 ng/L (ref <18)

## 2024-01-25 NOTE — ED Triage Notes (Signed)
 Pt reports upper abdominal pain starting 3 hrs ago. Pain progressed to epigastric, radiating through to back 1 hr ago. Hx of cardiac blockages, no stents. Associated mild SOB, weakness, fatigue.

## 2024-01-26 ENCOUNTER — Emergency Department (HOSPITAL_BASED_OUTPATIENT_CLINIC_OR_DEPARTMENT_OTHER)
Admission: EM | Admit: 2024-01-26 | Discharge: 2024-01-26 | Discharge status: Against Medical Advice | Payer: Medicare Other | Attending: Emergency Medicine | Admitting: Emergency Medicine

## 2024-01-26 ENCOUNTER — Ambulatory Visit: Payer: Self-pay | Admitting: Family Medicine

## 2024-01-26 ENCOUNTER — Telehealth: Payer: Self-pay | Admitting: Family Medicine

## 2024-01-26 NOTE — Telephone Encounter (Signed)
 Received call from Triage Nurse stating Patient refused ED. Patient did go to ED but LWBS. Advised by Dr. Jimmey Ralph - Patient needs to be seen within 24 hours. Scheduled 01/27/24 with Jarold Motto.  See previous Triage Notes from 01/26/24.

## 2024-01-26 NOTE — Telephone Encounter (Signed)
 Noted, patient has and office visit tomorrow with Jarold Motto

## 2024-01-26 NOTE — ED Notes (Signed)
 Pt requested to have IV removed while in waiting room. Stated he felt better. Encouraged patient to stay. Triage RN aware and IV removed. Pt left without being seen by provider.

## 2024-01-26 NOTE — Telephone Encounter (Addendum)
 Patient called back and reports that the pain is now subsiding. Patient is requesting a regular office appt with PCP.   Chief Complaint: Abdominal pain Symptoms: pain Frequency: Yesterday, resolved, returned around 1300 today Pertinent Negatives: Patient denies SOB, nausea, vomiting, diarrhea Disposition: [x] ED /[] Urgent Care (no appt availability in office) / [] Appointment(In office/virtual)/ []  [place]/ [] Home Care/ [] Refused Recommended Disposition /[] [place] [facility]/ []  Follow-up with PCP Additional Notes: Patient calls reporting severe abdominal pain that began yesterday and resolved while in ER, but reports it returned around 1300 today. States that nothing is relieving pain- reports abdominal distention and 9/10 pain. States he had a bowel movement yesterday and today. Per protocol, patient to present to ED now for evaluation based of symptoms. Patient declines ED, this RN offered to call EMS because patient reported he is home alone, patient declined, states he may call later. Care advice reviewed. Contacted [facility], notified Harriett Sine of patient refusal of dispo.  Alerting PCP for review and follow up.    Copied from CRM [id number]. Topic: Clinical - Red Word Triage >> Jan 26, 2024  2:13 PM Fonda Kinder J wrote: Red Word that prompted transfer to Nurse Triage: Severe pain in stomach/swelling Reason for Disposition  [1] SEVERE pain (e.g., excruciating) AND [2] present > 1 hour  Answer Assessment - Initial Assessment Questions 1. LOCATION: "Where does it hurt?"      Upper abdomen 2. RADIATION: "Does the pain shoot anywhere else?" (e.g., chest, back)     Denies 3. ONSET: "When did the pain begin?" (Minutes, hours or days ago)      Yesterday 4. SUDDEN: "Gradual or sudden onset?"     Sudden, but resolved at 1930, returned around 1300 today. 5. PATTERN "Does the pain come and go, or is it constant?"    - If it comes and goes: "How long does it last?" "Do you have pain now?"      (Note: Comes and goes means the pain is intermittent. It goes away completely between bouts.)    - If constant: "Is it getting better, staying the same, or getting worse?"      (Note: Constant means the pain never goes away completely; most serious pain is constant and gets worse.)      Comes and goes 6. SEVERITY: "How bad is the pain?"  (e.g., Scale 1-10; mild, moderate, or severe)    - MILD (1-3): Doesn't interfere with normal activities, abdomen soft and not tender to touch.     - MODERATE (4-7): Interferes with normal activities or awakens from sleep, abdomen tender to touch.     - SEVERE (8-10): Excruciating pain, doubled over, unable to do any normal activities.       9/10 7. RECURRENT SYMPTOM: "Have you ever had this type of stomach pain before?" If Yes, ask: "When was the last time?" and "What happened that time?"      Denies 8. CAUSE: "What do you think is causing the stomach pain?"     Unsure, states he was laying down sleeping when it returned 9. RELIEVING/AGGRAVATING FACTORS: "What makes it better or worse?" (e.g., antacids, bending or twisting motion, bowel movement)     Unsure, ate lunch 10. OTHER SYMPTOMS: "Do you have any other symptoms?" (e.g., back pain, diarrhea, fever, urination pain, vomiting)       Denies  Protocols used: Abdominal Pain - Male-A-AH

## 2024-01-26 NOTE — Telephone Encounter (Signed)
 Please see triage note; pt scheduled with Samantha for 01/27/24

## 2024-01-27 ENCOUNTER — Ambulatory Visit (HOSPITAL_BASED_OUTPATIENT_CLINIC_OR_DEPARTMENT_OTHER)
Admission: RE | Admit: 2024-01-27 | Discharge: 2024-01-27 | Disposition: A | Discharge status: Home / Self Care | Payer: Medicare Other | Source: Ambulatory Visit | Attending: Physician Assistant | Admitting: Physician Assistant

## 2024-01-27 ENCOUNTER — Emergency Department (HOSPITAL_COMMUNITY)
Admission: EM | Admit: 2024-01-27 | Discharge: 2024-01-27 | Disposition: A | Discharge status: Home / Self Care | Payer: Medicare Other | Attending: Emergency Medicine | Admitting: Emergency Medicine

## 2024-01-27 ENCOUNTER — Encounter (HOSPITAL_COMMUNITY): Payer: Self-pay

## 2024-01-27 ENCOUNTER — Other Ambulatory Visit: Payer: Self-pay

## 2024-01-27 ENCOUNTER — Ambulatory Visit: Payer: Medicare Other | Admitting: Physician Assistant

## 2024-01-27 ENCOUNTER — Encounter: Payer: Self-pay | Admitting: Physician Assistant

## 2024-01-27 VITALS — BP 120/60 | HR 69 | Temp 98.1°F | Ht 72.0 in | Wt 235.0 lb

## 2024-01-27 DIAGNOSIS — Z7982 Long term (current) use of aspirin: Secondary | ICD-10-CM | POA: Diagnosis not present

## 2024-01-27 DIAGNOSIS — Z7984 Long term (current) use of oral hypoglycemic drugs: Secondary | ICD-10-CM | POA: Diagnosis not present

## 2024-01-27 DIAGNOSIS — I2510 Atherosclerotic heart disease of native coronary artery without angina pectoris: Secondary | ICD-10-CM | POA: Diagnosis not present

## 2024-01-27 DIAGNOSIS — R1013 Epigastric pain: Secondary | ICD-10-CM | POA: Insufficient documentation

## 2024-01-27 DIAGNOSIS — R7401 Elevation of levels of liver transaminase levels: Secondary | ICD-10-CM | POA: Insufficient documentation

## 2024-01-27 DIAGNOSIS — Z79899 Other long term (current) drug therapy: Secondary | ICD-10-CM | POA: Diagnosis not present

## 2024-01-27 DIAGNOSIS — K8020 Calculus of gallbladder without cholecystitis without obstruction: Secondary | ICD-10-CM | POA: Diagnosis present

## 2024-01-27 DIAGNOSIS — E119 Type 2 diabetes mellitus without complications: Secondary | ICD-10-CM | POA: Insufficient documentation

## 2024-01-27 LAB — CBC WITH DIFFERENTIAL/PLATELET
Basophils Absolute: 0.1 10*3/uL (ref 0.0–0.1)
Basophils Relative: 0.6 % (ref 0.0–3.0)
Eosinophils Absolute: 1 10*3/uL — ABNORMAL HIGH (ref 0.0–0.7)
Eosinophils Relative: 7.8 % — ABNORMAL HIGH (ref 0.0–5.0)
HCT: 41.7 % (ref 39.0–52.0)
Hemoglobin: 13.7 g/dL (ref 13.0–17.0)
Lymphocytes Relative: 14.9 % (ref 12.0–46.0)
Lymphs Abs: 2 10*3/uL (ref 0.7–4.0)
MCHC: 33 g/dL (ref 30.0–36.0)
MCV: 92.4 fL (ref 78.0–100.0)
Monocytes Absolute: 1.1 10*3/uL — ABNORMAL HIGH (ref 0.1–1.0)
Monocytes Relative: 8.3 % (ref 3.0–12.0)
Neutro Abs: 9 10*3/uL — ABNORMAL HIGH (ref 1.4–7.7)
Neutrophils Relative %: 68.4 % (ref 43.0–77.0)
Platelets: 394 10*3/uL (ref 150.0–400.0)
RBC: 4.51 Mil/uL (ref 4.22–5.81)
RDW: 15.7 % — ABNORMAL HIGH (ref 11.5–15.5)
WBC: 13.1 10*3/uL — ABNORMAL HIGH (ref 4.0–10.5)

## 2024-01-27 LAB — COMPREHENSIVE METABOLIC PANEL
ALT: 137 U/L — ABNORMAL HIGH (ref 0–53)
AST: 71 U/L — ABNORMAL HIGH (ref 0–37)
Albumin: 4.2 g/dL (ref 3.5–5.2)
Alkaline Phosphatase: 135 U/L — ABNORMAL HIGH (ref 39–117)
BUN: 16 mg/dL (ref 6–23)
CO2: 26 meq/L (ref 19–32)
Calcium: 9.4 mg/dL (ref 8.4–10.5)
Chloride: 99 meq/L (ref 96–112)
Creatinine, Ser: 0.89 mg/dL (ref 0.40–1.50)
GFR: 86.08 mL/min (ref >60.00)
Glucose, Bld: 107 mg/dL — ABNORMAL HIGH (ref 70–99)
Potassium: 3.6 meq/L (ref 3.5–5.1)
Sodium: 136 meq/L (ref 135–145)
Total Bilirubin: 1.2 mg/dL (ref 0.2–1.2)
Total Protein: 8 g/dL (ref 6.0–8.3)

## 2024-01-27 LAB — LIPASE: Lipase: 26 U/L (ref 11.0–59.0)

## 2024-01-27 LAB — TSH: TSH: 4.08 u[IU]/mL (ref 0.35–5.50)

## 2024-01-27 NOTE — ED Triage Notes (Signed)
 Patient is here for evaluation of abnormal labs. States he went to his PCP today and labs were drawn and were elevated, then was sent to have an ultrasound. States was told to come to the ER due to abnormal gallbladder levels. Pt reports pain after eating and has been having pain off and on in the right upper quadrant on Wednesday and Thursday. Denies any pain today.

## 2024-01-27 NOTE — Progress Notes (Signed)
 Roy Avila is a 73 y.o. male here for a new problem.  History of Present Illness:   Chief Complaint  Patient presents with   Abdominal Pain    Pt c/o abdominal pain, epigastric area, started Wednesday, denies nausea. Went to ED yesterday.    HPI  Abdominal Pain: Pt complains of persistent epigastric pain starting Wednesday.  His first episode was after eating dinner on Wednesday and presented to the ED later that night.  While waiting in the ED, his sx suddenly resolved and he went home before being seen.  Pt endorses chills and diaphoresis and rates his pain 8/10 during his episodes. Yesterday his pain returned in the same area and at the same intensity after eating a store bought bologna sandwich with cheese for lunch.  He last ate a shake and a banana for breakfast at 7 AM, states he has this for breakfast daily.  He notes his Greggory Keen was increased from 5 mg to 7.5 a month ago.  Has been taking Mounjaro since April.  His bowel movements are normal, no recent changes.  Takes aspirin daily and avoids taking ibuprofen.  Only drinks alcohol socially, has not drank alcohol before these episodes.  Denies any unusual bleeding, nausea, or vomiting.  Past Medical History:  Diagnosis Date   Abnormal LFTs (liver function tests), monitoring, likely due to fatty liver, may need [name] 01/27/2017   Anxiety    Back pain    Chronic otitis media after insertion of tympanic ventilation tube, right 07/13/2018   Depression    GERD (gastroesophageal reflux disease)    High blood sugar    Hyperlipidemia    Hypertension    Obstructive sleep apnea syndrome 01/22/2017   Prediabetes    Sleep apnea    Swallowing difficulty      Social History   Tobacco Use   Smoking status: Former    Types: Cigarettes   Smokeless tobacco: Never   Tobacco comments:    Social smoker, 1 pack per week  Substance Use Topics   Alcohol use: Yes    Comment: social drink   Drug use: No    Past Surgical History:   Procedure Laterality Date   HERNIA REPAIR     LEFT HEART CATH AND CORONARY ANGIOGRAPHY N/A 08/24/2023   Procedure: LEFT HEART CATH AND CORONARY ANGIOGRAPHY;  Surgeon: [place], Peter M, MD;  Location: [facility];  Service: Cardiovascular;  Laterality: N/A;    Family History  Problem Relation Age of Onset   Breast cancer Mother    Hypertension Mother    Obesity Mother    Heart disease Father    Hypertension Father    Hyperlipidemia Father    Sudden death Father    Prostate cancer Neg Hx    Colon cancer Neg Hx     Allergies  Allergen Reactions   Clarithromycin Rash    Current Medications:   Current Outpatient Medications:    ALPRAZolam (XANAX) 0.5 MG tablet, Take 1 tablet (0.5 mg total) by mouth 2 (two) times daily as needed for anxiety., Disp: 20 tablet, Rfl: 0   amLODipine (NORVASC) 5 MG tablet, TAKE 1 TABLET DAILY, Disp: 90 tablet, Rfl: 3   aspirin EC 81 MG tablet, Take 1 tablet (81 mg total) by mouth daily. Swallow whole., Disp: , Rfl:    atorvastatin (LIPITOR) 40 MG tablet, Take 1 tablet (40 mg total) by mouth daily., Disp: 90 tablet, Rfl: 1   Clobetasol Propionate 0.05 % shampoo, Apply 1  Application topically every other day., Disp: , Rfl:    doxycycline (VIBRAMYCIN) 100 MG capsule, Take 100 mg by mouth 2 (two) times daily., Disp: , Rfl:    esomeprazole (NEXIUM) 40 MG capsule, Take 1 capsule (40 mg total) by mouth daily., Disp: 90 capsule, Rfl: 1   metFORMIN (GLUCOPHAGE-XR) 500 MG 24 hr tablet, Take 1 tablet (500 mg total) by mouth at bedtime., Disp: 90 tablet, Rfl: 1   metoprolol succinate (TOPROL XL) 25 MG 24 hr tablet, Take 1 tablet (25 mg total) by mouth daily., Disp: 90 tablet, Rfl: 3   olmesartan-hydrochlorothiazide (BENICAR HCT) 40-25 MG tablet, Take 1 tablet by mouth daily., Disp: 90 tablet, Rfl: 1   tirzepatide (MOUNJARO) 7.5 MG/0.5ML Pen, Inject 7.5 mg into the skin once a week., Disp: 6 mL, Rfl: 0   nitroGLYCERIN (NITROSTAT) 0.4 MG SL tablet, Place 1 tablet  (0.4 mg total) under the tongue every 5 (five) minutes as needed for chest pain., Disp: 90 tablet, Rfl: 3   Review of Systems:   Negative unless otherwise specified per HPI.  Vitals:   Vitals:   01/27/24 1121  BP: 120/60  Pulse: 69  Temp: 98.1 F (36.7 C)  TempSrc: Temporal  SpO2: 94%  Weight: 235 lb (106.6 kg)  Height: 6' (1.829 m)     Body mass index is 31.87 kg/m.  Physical Exam:   Physical Exam Vitals and nursing note reviewed.  Constitutional:      General: He is not in acute distress.    Appearance: He is well-developed. He is not ill-appearing or toxic-appearing.  Cardiovascular:     Rate and Rhythm: Normal rate and regular rhythm.     Pulses: Normal pulses.     Heart sounds: Normal heart sounds, S1 normal and S2 normal.  Pulmonary:     Effort: Pulmonary effort is normal.     Breath sounds: Normal breath sounds.  Abdominal:     General: Abdomen is flat. Bowel sounds are normal.     Palpations: Abdomen is soft.     Tenderness: There is no abdominal tenderness. There is no right CVA tenderness, left CVA tenderness, guarding or rebound. Negative signs include Murphy's sign.  Skin:    General: Skin is warm and dry.  Neurological:     Mental Status: He is alert.     GCS: GCS eye subscore is 4. GCS verbal subscore is 5. GCS motor subscore is 6.  Psychiatric:        Speech: Speech normal.        Behavior: Behavior normal. Behavior is cooperative.     Assessment and Plan:   1. Epigastric pain (Primary) - TSH - Lipase - US Abdomen Limited RUQ (LIVER/GB); Future - Urinalysis, Routine w reflex microscopic - Comprehensive metabolic panel - CBC with Differential/Platelet   No evidence of acute abdomen on my exam Discussed differential diagnosis including, but not limited to: gastritis, peptic ulcer disease, gastroesophageal reflux disease, cholelithiasis, IBS, gas, constipation Advised as follows: We are going to get blood work, urinalysis and ultrasound to  further evaluate  Continue Nexium Next time you do eat (please do not eat until after you have been notified of your scheduled ultrasound as you have to have not eaten for this test), please eat very low fat foods If your ultrasound is normal, we will likely proceed with CT scan  If your ultrasound is normal, I will also likely send in carafate for you to take to treat for possible gastritis over the  weekend while we wait on your CT scan results  IF ANY WORSENING SYMPTOM(S) IN THE MEANTIME -- RETURN TO THE ER    I, Isabelle Course, acting as a Neurosurgeon for Jarold Motto, [place]., have documented all relevant documentation on the behalf of Jarold Motto, [place], as directed by  Jarold Motto, PA while in the presence of Jarold Motto, [place].  I, Jarold Motto, [place], have reviewed all documentation for this visit. The documentation on 01/27/24 for the exam, diagnosis, procedures, and orders are all accurate and complete.  I spent a total of 30 minutes on this visit, today 01/27/24, ordering tests, discussing plan of care with patient and using shared-decision making on next steps, ordering imaging and tests and documenting the findings in the note.   Jarold Motto, PA-C

## 2024-01-27 NOTE — ED Provider Notes (Signed)
 [place] EMERGENCY DEPARTMENT AT [facility] Provider Note   CSN: [id number] Arrival date & time: 01/27/24  1706     History  Chief Complaint  Patient presents with   Abnormal Labs    Roy Avila is a 73 y.o. male.  The history is provided by the patient and medical records. No language interpreter was used.     73 year old male history of obesity, diabetes, CAD, dyslipidemia, GERD who was sent here from his PCP for concerns of abnormal labs.  Patient reports within the past week he has had intermittent episode of postprandial pain.  Pain is in the epigastric region brought on by eating.  Pain is nonradiating.  No associated fever no nausea vomiting no dysuria no chest pain or shortness of breath or productive cough.  Patient was evaluated by his PCP today, labs was obtained, and he also received a abdominal ultrasound.  He was notified that his labs and imaging was abnormal and to come to the ER.  He mention his last bout of pain was 2 days ago and he is currently pain-free.  Certain foods may trigger pain but drinking protein shakes seems to be okay.  Denies alcohol abuse.  Home Medications Prior to Admission medications   Medication Sig Start Date End Date Taking? Authorizing Provider  ALPRAZolam Prudy Feeler) 0.5 MG tablet Take 1 tablet (0.5 mg total) by mouth 2 (two) times daily as needed for anxiety. 10/17/23   Ardith Dark, MD  amLODipine (NORVASC) 5 MG tablet TAKE 1 TABLET DAILY 12/12/23   Ardith Dark, MD  aspirin EC 81 MG tablet Take 1 tablet (81 mg total) by mouth daily. Swallow whole. 08/10/23   Swinyer, Zachary George, NP  atorvastatin (LIPITOR) 40 MG tablet Take 1 tablet (40 mg total) by mouth daily. 12/19/23   Ardith Dark, MD  Clobetasol Propionate 0.05 % shampoo Apply 1 Application topically every other day. 08/11/23   [provider]  doxycycline (VIBRAMYCIN) 100 MG capsule Take 100 mg by mouth 2 (two) times daily. 01/04/24   [provider]   esomeprazole (NEXIUM) 40 MG capsule Take 1 capsule (40 mg total) by mouth daily. 12/19/23   Ardith Dark, MD  metFORMIN (GLUCOPHAGE-XR) 500 MG 24 hr tablet Take 1 tablet (500 mg total) by mouth at bedtime. 12/19/23   Ardith Dark, MD  metoprolol succinate (TOPROL XL) 25 MG 24 hr tablet Take 1 tablet (25 mg total) by mouth daily. 10/04/23   Swaziland, Peter M, MD  nitroGLYCERIN (NITROSTAT) 0.4 MG SL tablet Place 1 tablet (0.4 mg total) under the tongue every 5 (five) minutes as needed for chest pain. 10/04/23 01/02/24  Swaziland, Peter M, MD  olmesartan-hydrochlorothiazide (BENICAR HCT) 40-25 MG tablet Take 1 tablet by mouth daily. 12/19/23   Ardith Dark, MD  tirzepatide [facility]) 7.5 MG/0.5ML Pen Inject 7.5 mg into the skin once a week. 12/27/23   Allwardt, Crist Infante, PA-C      Allergies    Clarithromycin    Review of Systems   Review of Systems  All other systems reviewed and are negative.   Physical Exam Updated Vital Signs BP (!) 155/77   Pulse 83   Temp 98.2 F (36.8 C) (Oral)   Resp 18   Ht 6' (1.829 m)   Wt 106.6 kg   SpO2 97%   BMI 31.87 kg/m  Physical Exam Vitals and nursing note reviewed.  Constitutional:      General: He is not  in acute distress.    Appearance: He is well-developed.  HENT:     Head: Atraumatic.  Eyes:     Conjunctiva/sclera: Conjunctivae normal.  Cardiovascular:     Rate and Rhythm: Normal rate and regular rhythm.     Pulses: Normal pulses.     Heart sounds: Normal heart sounds.  Pulmonary:     Effort: Pulmonary effort is normal.     Breath sounds: Normal breath sounds. No wheezing, rhonchi or rales.  Abdominal:     General: There is no distension.     Palpations: Abdomen is soft.     Tenderness: There is no abdominal tenderness. There is no guarding.     Hernia: No hernia is present.  Musculoskeletal:     Cervical back: Neck supple.  Skin:    Findings: No rash.  Neurological:     Mental Status: He is alert.     ED Results /  Procedures / Treatments   Labs (all labs ordered are listed, but only abnormal results are displayed) Labs Reviewed - No data to display  EKG None  Radiology US Abdomen Limited RUQ (LIVER/GB) Result Date: 01/27/2024 CLINICAL DATA:  Right upper quadrant pain EXAM: ULTRASOUND ABDOMEN LIMITED RIGHT UPPER QUADRANT COMPARISON:  None Available. FINDINGS: Gallbladder: Moderate layering sludge within the gallbladder. Gallbladder wall thickness of up to 4.3 mm. No shadowing stones or pericholecystic fluid. No sonographic Murphy sign noted by sonographer. Common bile duct: Diameter: 4 mm. Liver: No focal lesion identified. Mildly increased hepatic parenchymal echogenicity. Portal vein is patent on color Doppler imaging with normal direction of blood flow towards the liver. Other: None. IMPRESSION: 1. Moderate layering sludge within the gallbladder with mild gallbladder wall thickening. No shadowing stones or pericholecystic fluid. Negative sonographic Murphy sign. Findings are equivocal for cholecystitis. If further imaging evaluation is clinically warranted, a nuclear medicine hepatobiliary scan could be performed. 2. The echogenicity of the liver is mildly increased. This is a nonspecific finding but is most commonly seen with fatty infiltration of the liver. There are no obvious focal liver lesions. Electronically Signed   By: Duanne Guess D.O.   On: 01/27/2024 15:14   DG Chest 2 View Result Date: 01/25/2024 CLINICAL DATA:  Chest pain EXAM: CHEST - 2 VIEW COMPARISON:  10/01/2019 FINDINGS: The heart size and mediastinal contours are within normal limits. Both lungs are clear. The visualized skeletal structures are unremarkable. IMPRESSION: No active cardiopulmonary disease. Electronically Signed   By: Charlett Nose M.D.   On: 01/25/2024 21:23    Procedures Procedures    Medications Ordered in ED Medications - No data to display  ED Course/ Medical Decision Making/ A&P                                  Medical Decision Making  BP (!) 155/77   Pulse 83   Temp 98.2 F (36.8 C) (Oral)   Resp 18   Ht 6' (1.829 m)   Wt 106.6 kg   SpO2 97%   BMI 31.87 kg/m   34:14 PM  73 year old male history of obesity, diabetes, CAD, dyslipidemia, GERD who was sent here from his PCP for concerns of abnormal labs.  Patient reports within the past week he has had intermittent episode of postprandial pain.  Pain is in the epigastric region brought on by eating.  Pain is nonradiating.  No associated fever no nausea vomiting no dysuria no chest pain  or shortness of breath or productive cough.  Patient was evaluated by his PCP today, labs was obtained, and he also received a abdominal ultrasound.  He was notified that his labs and imaging was abnormal and to come to the ER.  He mention his last bout of pain was 2 days ago and he is currently pain-free.  Certain foods may trigger pain but drinking protein shakes seems to be okay.  Denies alcohol abuse.  On exam, patient is resting comfortably appears to be in no acute discomfort.  He does not have any reproducible abdominal tenderness.  Negative Murphy sign, no pain at McBurney's point.  EMR reviewed patient was seen by PCP today and a limited abdominal ultrasound was obtained showing gallbladder sludge with minimal gallbladder wall thickening but negative Murphy sign.  There was documented as equivocal for cholecystitis.  Patient does have mild transaminitis but common bile duct is within normal limit.  Since examination is fairly benign, I discussed care with attending Dr. Jearld Fenton and was felt patient is stable to follow-up outpatient with general surgery with GI for further management.  Encourage patient to avoid eating any fatty food in the meantime, recommend brat diet and gave strict return precaution.  Patient voiced understanding and agrees with plan.  He feels comfortable going home.  Additional workup including abdominal pelvis CT scan considered but not  performed as it is low yield.  Patient may benefit from HIDA scan or ERCP if indicated but this could be done outpatient as he is pain-free.        Final Clinical Impression(s) / ED Diagnoses Final diagnoses:  Calculus of gallbladder without cholecystitis without obstruction  Transaminitis    Rx / DC Orders ED Discharge Orders     None         Fayrene Helper, PA-C 01/27/24 1756    Loetta Rough, MD 01/27/24 [phone]

## 2024-01-27 NOTE — Discharge Instructions (Signed)
 You have been evaluated for your symptoms.  Your abdominal pain is likely due to gallbladder sludge causing discomfort.  Please call and follow-up closely with [facility] for further care.  You may also reach out to gastroenterologist, Dr. Dulce Sellar for management of your symptoms.  However, if you develop worsening pain, fever, persistent nausea and vomiting do not hesitate to return to the ER for reevaluation.

## 2024-01-27 NOTE — Patient Instructions (Signed)
 It was great to see you!  We are going to get blood work, urinalysis and ultrasound to further evaluate  Continue Nexium Next time you do eat (please do not eat until after you have been notified of your scheduled ultrasound as you have to have not eaten for this test), please eat very low fat foods If your ultrasound is normal, we will likely proceed with CT scan  If your ultrasound is normal, I will also likely send in carafate for you to take to treat for possible gastritis over the weekend while we wait on your CT scan results  IF ANY WORSENING SYMPTOM(S) IN THE MEANTIME -- RETURN TO THE ER   Abdominal Pain, Adult Many things can cause belly (abdominal) pain. In most cases, belly pain is not a serious problem and can be watched and treated at home. But in some cases, it can be serious. Your doctor will try to find the cause of your belly pain.  Follow these instructions at home: Medicines Take over-the-counter and prescription medicines only as told by your doctor. Do not take medicines that help you poop (laxatives) unless told by your doctor. General instructions Watch your belly pain for any changes. Tell your doctor if the pain gets worse. Drink enough fluid to keep your pee (urine) pale yellow. Contact a doctor if: Your belly pain changes or gets worse. You have very bad cramping or bloating in your belly. You vomit. Your pain gets worse with meals, after eating, or with certain foods. You have trouble pooping or have watery poop for more than 2-3 days. You are not hungry, or you lose weight without trying. You have signs of not getting enough fluid or water (dehydration). These may include: Dark pee, very little pee, or no pee. Cracked lips or dry mouth. Feeling sleepy or weak. You have pain when you pee or poop. Your belly pain wakes you up at night. You have blood in your pee. You have a fever. Get help right away if: You cannot stop vomiting. Your pain is only in  one part of your belly, like on the right side. You have bloody or black poop, or poop that looks like tar. You have trouble breathing. You have chest pain. These symptoms may be an emergency. Get help right away. Call 911. Do not wait to see if the symptoms will go away. Do not drive yourself to the hospital. This information is not intended to replace advice given to you by your health care provider. Make sure you discuss any questions you have with your health care provider. Document Revised: 09/01/2022 Document Reviewed: 09/01/2022 Elsevier Patient Education  2024 [employer].

## 2024-02-01 ENCOUNTER — Telehealth (HOSPITAL_BASED_OUTPATIENT_CLINIC_OR_DEPARTMENT_OTHER): Payer: Self-pay | Admitting: *Deleted

## 2024-02-01 LAB — HM DIABETES EYE EXAM

## 2024-02-01 NOTE — Telephone Encounter (Signed)
 S/w pt to let pt know the insurance papers for missed tripped are filled out by Lebron Conners and are ready for pick up at front desk.  Papers were given to [facility].

## 2024-03-02 ENCOUNTER — Other Ambulatory Visit: Payer: Self-pay | Admitting: Surgery

## 2024-03-02 ENCOUNTER — Telehealth: Payer: Self-pay

## 2024-03-02 NOTE — Telephone Encounter (Signed)
   Name: Roy Avila  DOB: 05/26/1951  MRN: [id number]  Primary Cardiologist: None   Preoperative team, please contact this patient and set up a phone call appointment for further preoperative risk assessment. Please obtain consent and complete medication review. Thank you for your help.  I confirm that guidance regarding antiplatelet and oral anticoagulation therapy has been completed and, if necessary, noted below.  Ideally aspirin should be continued without interruption, however if the bleeding risk is too great, aspirin may be held for 5-7 days prior to surgery. Please resume aspirin post operatively when it is felt to be safe from a bleeding standpoint.    I also confirmed the patient resides in the state of [place]. As per [facility] Medical Board telemedicine laws, the patient must reside in the state in which the provider is licensed.   Carlos Levering, NP 03/02/2024, 3:46 PM [facility]

## 2024-03-02 NOTE — Telephone Encounter (Signed)
   Pre-operative Risk Assessment    Patient Name: Roy Avila  DOB: 1951/04/21 MRN: [id number]   Date of last office visit: 10/04/23 PETER Swaziland, MD Date of next office visit: NONE   Request for Surgical Clearance    Procedure:   LAP CHOLECYSTECTOMY  Date of Surgery:  Clearance TBD                                Surgeon:  Abigail Miyamoto, MD Surgeon's Group or Practice Name:  [facility] Phone number:  [phone] Fax number:  [phone]  ATTN: Scarlett Presto, CMA   Type of Clearance Requested:   - Medical  - Pharmacy:  Hold Aspirin     Type of Anesthesia:  General    Additional requests/questions:    Signed, Marlow Baars   03/02/2024, 2:48 PM

## 2024-03-06 ENCOUNTER — Telehealth: Payer: Self-pay | Admitting: *Deleted

## 2024-03-06 NOTE — Telephone Encounter (Signed)
 Pt has been scheduled tele preop appt 03/15/24. Med rec and consent are done.   Pt tells me that surgeon states he is going to watch and wait as the pt's gallbladder has calmed down some. Surgeon still wants a clearance to be ready if gallbladder acts back up in the next 2 months.     Patient Consent for Virtual Visit        EYAD ROCHFORD has provided verbal consent on 03/06/2024 for a virtual visit (video or telephone).   CONSENT FOR VIRTUAL VISIT FOR:  Francene Boyers  By participating in this virtual visit I agree to the following:  I hereby voluntarily request, consent and authorize [facility] and its employed or contracted physicians, physician assistants, nurse practitioners or other licensed health care professionals (the Practitioner), to provide me with telemedicine health care services (the "Services") as deemed necessary by the treating Practitioner. I acknowledge and consent to receive the Services by the Practitioner via telemedicine. I understand that the telemedicine visit will involve communicating with the Practitioner through live audiovisual communication technology and the disclosure of certain medical information by electronic transmission. I acknowledge that I have been given the opportunity to request an in-person assessment or other available alternative prior to the telemedicine visit and am voluntarily participating in the telemedicine visit.  I understand that I have the right to withhold or withdraw my consent to the use of telemedicine in the course of my care at any time, without affecting my right to future care or treatment, and that the Practitioner or I may terminate the telemedicine visit at any time. I understand that I have the right to inspect all information obtained and/or recorded in the course of the telemedicine visit and may receive copies of available information for a reasonable fee.  I understand that some of the potential risks of receiving  the Services via telemedicine include:  Delay or interruption in medical evaluation due to technological equipment failure or disruption; Information transmitted may not be sufficient (e.g. poor resolution of images) to allow for appropriate medical decision making by the Practitioner; and/or  In rare instances, security protocols could fail, causing a breach of personal health information.  Furthermore, I acknowledge that it is my responsibility to provide information about my medical history, conditions and care that is complete and accurate to the best of my ability. I acknowledge that Practitioner's advice, recommendations, and/or decision may be based on factors not within their control, such as incomplete or inaccurate data provided by me or distortions of diagnostic images or specimens that may result from electronic transmissions. I understand that the practice of medicine is not an exact science and that Practitioner makes no warranties or guarantees regarding treatment outcomes. I acknowledge that a copy of this consent can be made available to me via my patient portal [facility] MyChart), or I can request a printed copy by calling the office of [facility].    I understand that my insurance will be billed for this visit.   I have read or had this consent read to me. I understand the contents of this consent, which adequately explains the benefits and risks of the Services being provided via telemedicine.  I have been provided ample opportunity to ask questions regarding this consent and the Services and have had my questions answered to my satisfaction. I give my informed consent for the services to be provided through the use of telemedicine in my medical care

## 2024-03-06 NOTE — Telephone Encounter (Signed)
 Pt has been scheduled tele preop appt 03/15/24. Med rec and consent are done.   Pt tells me that surgeon states he is going to watch and wait as the pt's gallbladder has calmed down some. Surgeon still wants a clearance to be ready if gallbladder acts back up in the next 2 months.

## 2024-03-15 ENCOUNTER — Ambulatory Visit: Attending: Nurse Practitioner

## 2024-03-15 DIAGNOSIS — Z01810 Encounter for preprocedural cardiovascular examination: Secondary | ICD-10-CM

## 2024-03-15 NOTE — Progress Notes (Signed)
 Virtual Visit via Telephone Note   Because of Roy Avila co-morbid illnesses, he is at least at moderate risk for complications without adequate follow up.  This format is felt to be most appropriate for this patient at this time.  Due to technical limitations with video connection (technology), today's appointment will be conducted as an audio only telehealth visit, and Roy Avila verbally agreed to proceed in this manner.   All issues noted in this document were discussed and addressed.  No physical exam could be performed with this format.  Evaluation Performed:  Preoperative cardiovascular risk assessment _____________   Date:  03/15/2024   Patient ID:  Roy Avila, DOB 01/04/1951, MRN [id number] Patient Location:  Home Provider location:   Office  Primary Care Provider:  Rodney Clamp, MD Primary Cardiologist:  None  Chief Complaint / Patient Profile   73 y.o. y/o male with a h/o CAD HTN, HLD, DM type II who is pending lap cholecystectomy and presents today for telephonic preoperative cardiovascular risk assessment.  History of Present Illness    Roy Avila is a 73 y.o. male who presents via audio/video conferencing for a telehealth visit today.  Pt was last seen in cardiology clinic on 10/04/2023 by Dr. Swaziland.  At that time Roy Avila was doing well complaints of chest pain or shortness of breath.  He was walking more than half a mile daily and lost 30 pounds on Mounjaro. The patient is now pending procedure as outlined above. Since his last visit, he has been doing well with no new cardiac complaints.  He has lost an additional 10 pounds due to his new diet to prevent future gallbladder attacks.  He denies chest pain, shortness of breath, lower extremity edema, fatigue, palpitations, melena, hematuria, hemoptysis, diaphoresis, weakness, presyncope, syncope, orthopnea, and PND.    Past Medical History    Past Medical History:  Diagnosis Date    Abnormal LFTs (liver function tests), monitoring, likely due to fatty liver, may need US  01/27/2017   Anxiety    Back pain    Chronic otitis media after insertion of tympanic ventilation tube, right 07/13/2018   Depression    GERD (gastroesophageal reflux disease)    High blood sugar    Hyperlipidemia    Hypertension    Obstructive sleep apnea syndrome 01/22/2017   Prediabetes    Sleep apnea    Swallowing difficulty    Past Surgical History:  Procedure Laterality Date   HERNIA REPAIR     LEFT HEART CATH AND CORONARY ANGIOGRAPHY N/A 08/24/2023   Procedure: LEFT HEART CATH AND CORONARY ANGIOGRAPHY;  Surgeon: Avila, Roy M, MD;  Location: [facility] INVASIVE CV LAB;  Service: Cardiovascular;  Laterality: N/A;    Allergies  Allergies  Allergen Reactions   Clarithromycin Rash    Home Medications    Prior to Admission medications   Medication Sig Start Date End Date Taking? Authorizing Provider  ALPRAZolam (XANAX) 0.5 MG tablet Take 1 tablet (0.5 mg total) by mouth 2 (two) times daily as needed for anxiety. Patient not taking: Reported on 03/06/2024 10/17/23   Avila, Roy M, MD  amLODipine (NORVASC) 5 MG tablet TAKE 1 TABLET DAILY 12/12/23   Avila, Roy M, MD  aspirin EC 81 MG tablet Take 1 tablet (81 mg total) by mouth daily. Swallow whole. 08/10/23   Avila, Roy Punter, NP  atorvastatin (LIPITOR) 40 MG tablet Take 1 tablet (40 mg total) by mouth daily. 12/19/23   Roy Avila,  Roy M, MD  Clobetasol Propionate 0.05 % shampoo Apply 1 Application topically every other day. 08/11/23   [provider]  doxycycline (VIBRAMYCIN) 100 MG capsule Take 100 mg by mouth 2 (two) times daily. Patient not taking: Reported on 03/06/2024 01/04/24   [provider]  esomeprazole (NEXIUM) 40 MG capsule Take 1 capsule (40 mg total) by mouth daily. 12/19/23   Roy Clamp, MD  metFORMIN (GLUCOPHAGE-XR) 500 MG 24 hr tablet Take 1 tablet (500 mg total) by mouth at bedtime. 12/19/23   Roy Clamp, MD   metoprolol succinate (TOPROL XL) 25 MG 24 hr tablet Take 1 tablet (25 mg total) by mouth daily. 10/04/23   Avila, Roy M, MD  nitroGLYCERIN (NITROSTAT) 0.4 MG SL tablet Place 1 tablet (0.4 mg total) under the tongue every 5 (five) minutes as needed for chest pain. 10/04/23 01/02/24  Avila, Roy M, MD  olmesartan-hydrochlorothiazide (BENICAR HCT) 40-25 MG tablet Take 1 tablet by mouth daily. 12/19/23   Roy Clamp, MD  tirzepatide [facility]) 7.5 MG/0.5ML Pen Inject 7.5 mg into the skin once a week. 12/27/23   Allwardt, Deleta Felix, PA-C    Physical Exam    Vital Signs:  [name] does not have vital signs available for review today.  Given telephonic nature of communication, physical exam is limited. AAOx3. NAD. Normal affect.  Speech and respirations are unlabored.  Accessory Clinical Findings    None  Assessment & Plan    1.  Preoperative Cardiovascular Risk Assessment: - Patient's RCRI score is 0.9%  The patient affirms he has been doing well without any new cardiac symptoms. They are able to achieve 7 METS without cardiac limitations. Therefore, based on ACC/AHA guidelines, the patient would be at acceptable risk for the planned procedure without further cardiovascular testing. The patient was advised that if he develops new symptoms prior to surgery to contact our office to arrange for a follow-up visit, and he verbalized understanding.   The patient was advised that if he develops new symptoms prior to surgery to contact our office to arrange for a follow-up visit, and he verbalized understanding.  Patient can hold aspirin 5-7 days prior to surgery and should restart postprocedure when surgically safe and hemostasis is achieved.  A copy of this note will be routed to requesting surgeon.  Time:   Today, I have spent 7 minutes with the patient with telehealth technology discussing medical history, symptoms, and management plan.     [name], Retha Cast, NP  03/15/2024,  7:02 AM

## 2024-04-13 ENCOUNTER — Ambulatory Visit: Payer: Self-pay

## 2024-04-13 NOTE — Telephone Encounter (Signed)
 Copied from CRM [id number]. Topic: Clinical - Red Word Triage >> Apr 13, 2024  1:24 PM Dewanda Foots wrote: Red Word that prompted transfer to Nurse Triage: Pt states he has been having numbness and heat in both feet and both sets of toes for a couple of months now. States that at first he didn't think much of it, but now it is getting progressively worse.   Chief Complaint: Foot numbness  Symptoms: Bilateral foot numbness and head  Frequency: Constant  Pertinent Negatives: Patient denies any pain Disposition: [] ED /[] Urgent Care (no appt availability in office) / [x] Appointment(In office/virtual)/ []  [facility]/ [] Home Care/ [] Refused Recommended Disposition /[] [facility]/ []  Follow-up with PCP Additional Notes: Patient reports that he has had bilateral foot numbness for a few months that has been worsening. He states that he also feels warmth in his feet but denies any redness or other visual changes. Patient states he is out of town and won't be back until the 26th and would like to make an appointment for the 28th if possible. Appointment made for the patient on 5/28 per his request.     Reason for Disposition  [1] Numbness or tingling in both feet AND [2] new or increased  Answer Assessment - Initial Assessment Questions 1. SYMPTOM: "What's the main symptom you're concerned about?" (e.g., rash, sore, callus, drainage, numbness)     Numbness 2. LOCATION: "Where is the numbness located?" (e.g., foot/toe, top/bottom, left/right)     Bottom of both feet  3. APPEARANCE: "What does the area look like?" (e.g., normal, red, swollen; size)     Normal  4. ONSET: "When did the numbness start?"     A few months  5. PAIN: "Is there any pain?" If Yes, ask: "How bad is it?" (Scale: 1-10; mild, moderate, severe)     No pain 6. CAUSE: "What do you think is causing the symptoms?"     Unsure  7. OTHER SYMPTOMS: "Do you have any other symptoms?" (e.g., fever, weakness)     Feet feel  hot  Protocols used: Diabetes - Foot Problems and Questions-A-AH

## 2024-04-16 NOTE — Telephone Encounter (Signed)
Patient out of town 

## 2024-04-16 NOTE — Telephone Encounter (Signed)
 LVM to see if he can come in today, we have a few open appt

## 2024-04-25 ENCOUNTER — Ambulatory Visit: Admitting: Physician Assistant

## 2024-04-26 ENCOUNTER — Encounter: Payer: Self-pay | Admitting: Family Medicine

## 2024-04-26 ENCOUNTER — Ambulatory Visit: Admitting: Family Medicine

## 2024-04-26 VITALS — BP 118/68 | HR 63 | Temp 98.1°F | Ht 72.0 in | Wt 226.0 lb

## 2024-04-26 DIAGNOSIS — I10 Essential (primary) hypertension: Secondary | ICD-10-CM

## 2024-04-26 DIAGNOSIS — Z7985 Long-term (current) use of injectable non-insulin antidiabetic drugs: Secondary | ICD-10-CM | POA: Diagnosis not present

## 2024-04-26 DIAGNOSIS — G6289 Other specified polyneuropathies: Secondary | ICD-10-CM

## 2024-04-26 DIAGNOSIS — E1165 Type 2 diabetes mellitus with hyperglycemia: Secondary | ICD-10-CM

## 2024-04-26 DIAGNOSIS — G629 Polyneuropathy, unspecified: Secondary | ICD-10-CM | POA: Insufficient documentation

## 2024-04-26 MED ORDER — TIRZEPATIDE 7.5 MG/0.5ML ~~LOC~~ SOAJ: 7.5000 mg | SUBCUTANEOUS | 3 refills | Status: DC

## 2024-04-26 NOTE — Assessment & Plan Note (Signed)
 Unclear etiology.  His A1c's have been relatively well-controlled-doubt that this is contributing.  No other red flag signs or symptoms.  Will check labs today including CBC, c-Met, TSH, B12, folate, thiamine, and magnesium  level.  If labs are negative would consider referral to neurology for further evaluation.

## 2024-04-26 NOTE — Assessment & Plan Note (Signed)
 Doing very well with Mounjaro  7.5 mg weekly.  Will refill today.  He is also on metformin  500 mg daily.  Check A1c today with labs.

## 2024-04-26 NOTE — Patient Instructions (Signed)
 It was very nice to see you today!  I think you probably have neuropathy.  We will check blood work to look for any potential causes.  We may need to send you to see a neurologist depending on the results.  Return if symptoms worsen or fail to improve.   Take care, Dr Daneil Dunker  PLEASE NOTE:  If you had any lab tests, please let us  know if you have not heard back within a few days. You may see your results on mychart before we have a chance to review them but we will give you a call once they are reviewed by us .   If we ordered any referrals today, please let us  know if you have not heard from their office within the next week.   If you had any urgent prescriptions sent in today, please check with the pharmacy within an hour of our visit to make sure the prescription was transmitted appropriately.   Please try these tips to maintain a healthy lifestyle:  Eat at least 3 REAL meals and 1-2 snacks per day.  Aim for no more than 5 hours between eating.  If you eat breakfast, please do so within one hour of getting up.   Each meal should contain half fruits/vegetables, one quarter protein, and one quarter carbs (no bigger than a computer mouse)  Cut down on sweet beverages. This includes juice, soda, and sweet tea.   Drink at least 1 glass of water with each meal and aim for at least 8 glasses per day  Exercise at least 150 minutes every week.

## 2024-04-26 NOTE — Progress Notes (Signed)
   Roy Avila is a 73 y.o. male who presents today for an office visit.  Assessment/Plan:  Chronic Problems Addressed Today: Peripheral neuropathy Unclear etiology.  His A1c's have been relatively well-controlled-doubt that this is contributing.  No other red flag signs or symptoms.  Will check labs today including CBC, c-Met, TSH, B12, folate, thiamine, and magnesium  level.  If labs are negative would consider referral to neurology for further evaluation.  Essential hypertension At goal today with amlodipine  5 mg daily and olmesartan  HCTZ 40-25 once daily.  Type 2 diabetes mellitus with hyperglycemia, without long-term current use of insulin  (HCC) Doing very well with Mounjaro  7.5 mg weekly.  Will refill today.  He is also on metformin  500 mg daily.  Check A1c today with labs.     Subjective:  HPI:  See Assessment / plan for status of chronic conditions. His main concern today is feet numbness. This started several months ago. It was intermittent but more persistent lately. Sensation seems to be in bottom of his feet. Some burning sensation as well. Seems to be getting worse.         Objective:  Physical Exam: BP 118/68   Pulse 63   Temp 98.1 F (36.7 C) (Temporal)   Ht 6' (1.829 m)   Wt 226 lb (102.5 kg)   SpO2 96%   BMI 30.65 kg/m   Wt Readings from Last 3 Encounters:  04/26/24 226 lb (102.5 kg)  01/27/24 235 lb 0.2 oz (106.6 kg)  01/27/24 235 lb (106.6 kg)    Gen: No acute distress, resting comfortably Neuro: Cranial nerves grossly intact.  Moves all extremities spontaneously.  Bilateral lower extremities with sensation to touch intact.  Monofilament testing intact bilaterally.  Slight decreased sensation to proprioception on right compared to left.  Cap refill intact.  Pulses intact bilaterally. Psych: Normal affect and thought content      Mearl Olver M. Daneil Dunker, MD 04/26/2024 2:47 PM

## 2024-04-26 NOTE — Assessment & Plan Note (Signed)
 At goal today with amlodipine  5 mg daily and olmesartan  HCTZ 40-25 once daily.

## 2024-04-27 ENCOUNTER — Ambulatory Visit: Payer: Self-pay | Admitting: Family Medicine

## 2024-04-27 DIAGNOSIS — D649 Anemia, unspecified: Secondary | ICD-10-CM

## 2024-04-27 LAB — COMPREHENSIVE METABOLIC PANEL WITH GFR
ALT: 22 U/L (ref 0–53)
AST: 23 U/L (ref 0–37)
Albumin: 3.9 g/dL (ref 3.5–5.2)
Alkaline Phosphatase: 107 U/L (ref 39–117)
BUN: 17 mg/dL (ref 6–23)
CO2: 28 meq/L (ref 19–32)
Calcium: 9.5 mg/dL (ref 8.4–10.5)
Chloride: 100 meq/L (ref 96–112)
Creatinine, Ser: 0.82 mg/dL (ref 0.40–1.50)
GFR: 88.08 mL/min (ref >60.00)
Glucose, Bld: 87 mg/dL (ref 70–99)
Potassium: 3.9 meq/L (ref 3.5–5.1)
Sodium: 136 meq/L (ref 135–145)
Total Bilirubin: 0.6 mg/dL (ref 0.2–1.2)
Total Protein: 7.5 g/dL (ref 6.0–8.3)

## 2024-04-27 LAB — CBC
HCT: 38.4 % — ABNORMAL LOW (ref 39.0–52.0)
Hemoglobin: 12.8 g/dL — ABNORMAL LOW (ref 13.0–17.0)
MCHC: 33.3 g/dL (ref 30.0–36.0)
MCV: 91.3 fl (ref 78.0–100.0)
Platelets: 421 10*3/uL — ABNORMAL HIGH (ref 150.0–400.0)
RBC: 4.21 Mil/uL — ABNORMAL LOW (ref 4.22–5.81)
RDW: 16.3 % — ABNORMAL HIGH (ref 11.5–15.5)
WBC: 11.7 10*3/uL — ABNORMAL HIGH (ref 4.0–10.5)

## 2024-04-27 LAB — MAGNESIUM: Magnesium: 1.9 mg/dL (ref 1.5–2.5)

## 2024-04-27 LAB — TSH: TSH: 2.42 u[IU]/mL (ref 0.35–5.50)

## 2024-04-27 LAB — VITAMIN B12: Vitamin B-12: 299 pg/mL (ref 211–911)

## 2024-04-27 LAB — FOLATE: Folate: 5.9 ng/mL — ABNORMAL LOW (ref >5.9)

## 2024-04-27 NOTE — Progress Notes (Signed)
 His white blood cell counts are still elevated however his platelets are elevated and he is slightly anemic.  I do not believe this is related to his neuropathy however I think we probably should take a closer look at this.  Recommend referral to hematology.  His folate and B12 are on the lower range of normal.  This may be causing some of his neuropathy symptoms.  We can try starting supplementation with each of these.  He can get folate over-the-counter.  We can start B12 here.  We should recheck this in 3 months.  The rest of his labs are all at goal.  I would like for him to follow-up with me in 3 months however he should follow-up with us  sooner if symptoms are not improving or if he has any development of any new symptoms.

## 2024-04-30 ENCOUNTER — Ambulatory Visit

## 2024-05-01 ENCOUNTER — Ambulatory Visit (INDEPENDENT_AMBULATORY_CARE_PROVIDER_SITE_OTHER)

## 2024-05-01 DIAGNOSIS — E538 Deficiency of other specified B group vitamins: Secondary | ICD-10-CM

## 2024-05-01 MED ORDER — CYANOCOBALAMIN 1000 MCG/ML IJ SOLN
1000.0000 ug | Freq: Once | INTRAMUSCULAR | Status: AC
  Administered 2024-05-01: 1000 ug via INTRAMUSCULAR

## 2024-05-01 NOTE — Progress Notes (Signed)
.  Patient is in office today for a nurse visit for B12 Injection per Dr. Daneil Dunker. Patient Injection was given in the  Right deltoid. Patient tolerated injection well. Pt has been advised that he will receive 3 more weekly injections and then taper off to doing them monthly. Advised pt to stop at the front and make next weekly injection. Pt understood.

## 2024-05-08 ENCOUNTER — Ambulatory Visit

## 2024-05-09 ENCOUNTER — Ambulatory Visit (INDEPENDENT_AMBULATORY_CARE_PROVIDER_SITE_OTHER): Admitting: *Deleted

## 2024-05-09 DIAGNOSIS — E538 Deficiency of other specified B group vitamins: Secondary | ICD-10-CM | POA: Diagnosis not present

## 2024-05-09 MED ORDER — CYANOCOBALAMIN 1000 MCG/ML IJ SOLN
1000.0000 ug | Freq: Once | INTRAMUSCULAR | Status: AC
  Administered 2024-05-09: 1000 ug via INTRAMUSCULAR

## 2024-05-09 NOTE — Progress Notes (Signed)
 Patient presents for B12 injection today. Patient received her B12 injection in Right Deltoid. Patient tolerated injection well.  Documentation entered in [facility] in EpicCare.

## 2024-05-18 ENCOUNTER — Other Ambulatory Visit: Payer: Self-pay | Admitting: *Deleted

## 2024-05-18 ENCOUNTER — Ambulatory Visit (INDEPENDENT_AMBULATORY_CARE_PROVIDER_SITE_OTHER): Admitting: Physician Assistant

## 2024-05-18 ENCOUNTER — Ambulatory Visit: Admitting: *Deleted

## 2024-05-18 ENCOUNTER — Encounter: Payer: Self-pay | Admitting: Physician Assistant

## 2024-05-18 VITALS — BP 128/70 | HR 65 | Temp 97.6°F | Ht 72.0 in | Wt 225.4 lb

## 2024-05-18 DIAGNOSIS — E538 Deficiency of other specified B group vitamins: Secondary | ICD-10-CM | POA: Diagnosis not present

## 2024-05-18 DIAGNOSIS — R21 Rash and other nonspecific skin eruption: Secondary | ICD-10-CM | POA: Diagnosis not present

## 2024-05-18 MED ORDER — METHYLPREDNISOLONE ACETATE 40 MG/ML IJ SUSP
40.0000 mg | Freq: Once | INTRAMUSCULAR | Status: AC
  Administered 2024-05-18: 40 mg via INTRAMUSCULAR

## 2024-05-18 MED ORDER — CYANOCOBALAMIN 1000 MCG/ML IJ SOLN
1000.0000 ug | Freq: Once | INTRAMUSCULAR | Status: AC
  Administered 2024-05-18: 1000 ug via INTRAMUSCULAR

## 2024-05-18 MED ORDER — PREDNISONE 10 MG PO TABS: ORAL_TABLET | ORAL | 0 refills | Status: DC

## 2024-05-18 MED ORDER — CYANOCOBALAMIN 1000 MCG/ML IJ SOLN: INTRAMUSCULAR | 0 refills | Status: DC

## 2024-05-18 NOTE — Patient Instructions (Signed)
 It was great to see you!  Steroid injection today Start oral prednisone  TOMORROW 20 mg daily x 1 week, then 10 mg daily x 1 week.  Consider daily antihistamine such as claritin/allegra/zyrtec to help with this as well during duration of steroid  Follow up if any concerns  Take care,  Alexander Iba PA-C

## 2024-05-18 NOTE — Progress Notes (Signed)
 Roy Avila is a 73 y.o. male here for a new problem.  History of Present Illness:   Chief Complaint  Patient presents with   Rash    Pt c/o rash on arms and left thigh area x 1.5 weeks. He was seen at [facility] and was Prednisone  pack helped some and now spreading.   Poison Ivy: Pt complains of poison ivy rash on arms and thighs starting 1 and half weeks ago while out of town.  [facility] 6-day prednisone  pack. He finished this on Wednesday and notes that his symptoms, while they initially were improving while taking steroids, have worsened He has new rash in inner arms He is also using topical triamcinolone for this area Denies fever, chills He tolerated the steroid without any issues  Past Medical History:  Diagnosis Date   Abnormal LFTs (liver function tests), monitoring, likely due to fatty liver, may need US  01/27/2017   Anxiety    Back pain    Chronic otitis media after insertion of tympanic ventilation tube, right 07/13/2018   Depression    GERD (gastroesophageal reflux disease)    High blood sugar    Hyperlipidemia    Hypertension    Obstructive sleep apnea syndrome 01/22/2017   Prediabetes    Sleep apnea    Swallowing difficulty      Social History   Tobacco Use   Smoking status: Former    Types: Cigarettes   Smokeless tobacco: Never   Tobacco comments:    Social smoker, 1 pack per week  Substance Use Topics   Alcohol use: Yes    Comment: social drink   Drug use: No    Past Surgical History:  Procedure Laterality Date   HERNIA REPAIR     LEFT HEART CATH AND CORONARY ANGIOGRAPHY N/A 08/24/2023   Procedure: LEFT HEART CATH AND CORONARY ANGIOGRAPHY;  Surgeon: [place], Peter M, MD;  Location: [facility];  Service: Cardiovascular;  Laterality: N/A;    Family History  Problem Relation Age of Onset   Breast cancer Mother    Hypertension Mother    Obesity Mother    Heart disease Father    Hypertension Father     Hyperlipidemia Father    Sudden death Father    Prostate cancer Neg Hx    Colon cancer Neg Hx     Allergies  Allergen Reactions   Clarithromycin Rash    Current Medications:   Current Outpatient Medications:    ALPRAZolam  (XANAX ) 0.5 MG tablet, Take 1 tablet (0.5 mg total) by mouth 2 (two) times daily as needed for anxiety., Disp: 20 tablet, Rfl: 0   amLODipine  (NORVASC ) 5 MG tablet, TAKE 1 TABLET DAILY, Disp: 90 tablet, Rfl: 3   aspirin  EC 81 MG tablet, Take 1 tablet (81 mg total) by mouth daily. Swallow whole., Disp: , Rfl:    atorvastatin  (LIPITOR) 40 MG tablet, Take 1 tablet (40 mg total) by mouth daily., Disp: 90 tablet, Rfl: 1   Clobetasol Propionate 0.05 % shampoo, Apply 1 Application topically every other day., Disp: , Rfl:    doxycycline  (VIBRAMYCIN ) 100 MG capsule, Take 100 mg by mouth 2 (two) times daily., Disp: , Rfl:    esomeprazole  (NEXIUM ) 40 MG capsule, Take 1 capsule (40 mg total) by mouth daily., Disp: 90 capsule, Rfl: 1   metFORMIN  (GLUCOPHAGE -XR) 500 MG 24 hr tablet, Take 1 tablet (500 mg total) by mouth at bedtime., Disp: 90 tablet,  Rfl: 1   metoprolol  succinate (TOPROL  XL) 25 MG 24 hr tablet, Take 1 tablet (25 mg total) by mouth daily., Disp: 90 tablet, Rfl: 3   nitroGLYCERIN  (NITROSTAT ) 0.4 MG SL tablet, Place 1 tablet (0.4 mg total) under the tongue every 5 (five) minutes as needed for chest pain., Disp: 90 tablet, Rfl: 3   olmesartan -hydrochlorothiazide (BENICAR  HCT) 40-25 MG tablet, Take 1 tablet by mouth daily., Disp: 90 tablet, Rfl: 1   predniSONE  (DELTASONE ) 10 MG tablet, Start 20 mg daily x 1 week, then decrease to 10 mg daily x 1 week., Disp: 21 tablet, Rfl: 0   tirzepatide  (MOUNJARO ) 7.5 MG/0.5ML Pen, Inject 7.5 mg into the skin once a week., Disp: 6 mL, Rfl: 3   triamcinolone cream (KENALOG) 0.1 %, Apply 1 Application topically daily in the afternoon., Disp: , Rfl:    Review of Systems:   Negative unless otherwise specified per HPI.  Vitals:    Vitals:   05/18/24 1116  BP: 128/70  Pulse: 65  Temp: 97.6 F (36.4 C)  TempSrc: Temporal  SpO2: 95%  Weight: 225 lb 6.1 oz (102.2 kg)  Height: 6' (1.829 m)     Body mass index is 30.57 kg/m.  Physical Exam:   Physical Exam Vitals and nursing note reviewed.  Constitutional:      Appearance: He is well-developed.  HENT:     Head: Normocephalic.   Eyes:     Conjunctiva/sclera: Conjunctivae normal.     Pupils: Pupils are equal, round, and reactive to light.   Pulmonary:     Effort: Pulmonary effort is normal.   Musculoskeletal:        General: Normal range of motion.     Cervical back: Normal range of motion.   Skin:    General: Skin is warm and dry.     Comments: Multiple areas of erythematous papules on bilateral arms with excoriation marks   Neurological:     Mental Status: He is alert and oriented to person, place, and time.   Psychiatric:        Behavior: Behavior normal.        Thought Content: Thought content normal.        Judgment: Judgment normal.     Assessment and Plan:   1. Rash (Primary) - methylPREDNISolone acetate (DEPO-MEDROL) injection 40 mg   Suspect rebound rash from early cessation of steroid 40 mg Depo-Medrol injection provided today Starting tomorrow, take 20 mg prednisone  daily x 1 week, followed by 10 mg prednisone  daily x 1 week If symptoms worsen or persist, he was advised to let us  know I also recommend that he consider taking antihistamine to help with this allergic response   I, Bernita Bristle, acting as a Neurosurgeon for [employer], [place]., have documented all relevant documentation on the behalf of Alexander Iba, [place], as directed by   while in the presence of Alexander Iba, [place].  I, Alexander Iba, [place], have reviewed all documentation for this visit. The documentation on 05/18/24 for the exam, diagnosis, procedures, and orders are all accurate and complete.  Alexander Iba, PA-C

## 2024-05-18 NOTE — Progress Notes (Signed)
 Patient presents for B12 injection today. Patient received her B12 injection in Right thigh . Patient injected him self to verified if he can continue doing injection at home  Teach patient how to do it verbalized understanding  Rx send to pharmacy  Patient tolerated injection well.  Documentation entered in [facility] in EpicCare.

## 2024-05-28 ENCOUNTER — Ambulatory Visit: Payer: Self-pay | Admitting: *Deleted

## 2024-05-28 ENCOUNTER — Inpatient Hospital Stay: Attending: Oncology | Admitting: Oncology

## 2024-05-28 ENCOUNTER — Encounter: Payer: Self-pay | Admitting: Oncology

## 2024-05-28 ENCOUNTER — Inpatient Hospital Stay

## 2024-05-28 VITALS — BP 142/62 | HR 68 | Temp 98.4°F | Resp 18 | Ht 72.0 in | Wt 226.4 lb

## 2024-05-28 DIAGNOSIS — D72829 Elevated white blood cell count, unspecified: Secondary | ICD-10-CM

## 2024-05-28 DIAGNOSIS — Z87891 Personal history of nicotine dependence: Secondary | ICD-10-CM | POA: Diagnosis not present

## 2024-05-28 DIAGNOSIS — L719 Rosacea, unspecified: Secondary | ICD-10-CM | POA: Insufficient documentation

## 2024-05-28 DIAGNOSIS — E1142 Type 2 diabetes mellitus with diabetic polyneuropathy: Secondary | ICD-10-CM | POA: Diagnosis not present

## 2024-05-28 DIAGNOSIS — D75839 Thrombocytosis, unspecified: Secondary | ICD-10-CM | POA: Diagnosis not present

## 2024-05-28 DIAGNOSIS — I10 Essential (primary) hypertension: Secondary | ICD-10-CM | POA: Diagnosis not present

## 2024-05-28 DIAGNOSIS — D649 Anemia, unspecified: Secondary | ICD-10-CM | POA: Insufficient documentation

## 2024-05-28 DIAGNOSIS — Z803 Family history of malignant neoplasm of breast: Secondary | ICD-10-CM | POA: Insufficient documentation

## 2024-05-28 DIAGNOSIS — Z79899 Other long term (current) drug therapy: Secondary | ICD-10-CM | POA: Diagnosis not present

## 2024-05-28 DIAGNOSIS — E1159 Type 2 diabetes mellitus with other circulatory complications: Secondary | ICD-10-CM | POA: Insufficient documentation

## 2024-05-28 LAB — CBC WITH DIFFERENTIAL (CANCER CENTER ONLY)
Abs Immature Granulocytes: 0.07 10*3/uL (ref 0.00–0.07)
Basophils Absolute: 0 10*3/uL (ref 0.0–0.1)
Basophils Relative: 0 %
Eosinophils Absolute: 0.3 10*3/uL (ref 0.0–0.5)
Eosinophils Relative: 2 %
HCT: 40.5 % (ref 39.0–52.0)
Hemoglobin: 13.8 g/dL (ref 13.0–17.0)
Immature Granulocytes: 1 %
Lymphocytes Relative: 20 %
Lymphs Abs: 2.5 10*3/uL (ref 0.7–4.0)
MCH: 31.4 pg (ref 26.0–34.0)
MCHC: 34.1 g/dL (ref 30.0–36.0)
MCV: 92 fL (ref 80.0–100.0)
Monocytes Absolute: 1.2 10*3/uL — ABNORMAL HIGH (ref 0.1–1.0)
Monocytes Relative: 10 %
Neutro Abs: 8.4 10*3/uL — ABNORMAL HIGH (ref 1.7–7.7)
Neutrophils Relative %: 67 %
Platelet Count: 376 10*3/uL (ref 150–400)
RBC: 4.4 MIL/uL (ref 4.22–5.81)
RDW: 15.4 % (ref 11.5–15.5)
WBC Count: 12.5 10*3/uL — ABNORMAL HIGH (ref 4.0–10.5)
nRBC: 0.2 % (ref 0.0–0.2)

## 2024-05-28 LAB — FOLATE: Folate: 6.3 ng/mL (ref >5.9)

## 2024-05-28 LAB — CMP (CANCER CENTER ONLY)
ALT: 20 U/L (ref 0–44)
AST: 18 U/L (ref 15–41)
Albumin: 4.1 g/dL (ref 3.5–5.0)
Alkaline Phosphatase: 111 U/L (ref 38–126)
Anion gap: 11 (ref 5–15)
BUN: 16 mg/dL (ref 8–23)
CO2: 27 mmol/L (ref 22–32)
Calcium: 9.6 mg/dL (ref 8.9–10.3)
Chloride: 98 mmol/L (ref 98–111)
Creatinine: 0.79 mg/dL (ref 0.61–1.24)
GFR, Estimated: >60 mL/min (ref >60)
Glucose, Bld: 102 mg/dL — ABNORMAL HIGH (ref 70–99)
Potassium: 3.5 mmol/L (ref 3.5–5.1)
Sodium: 136 mmol/L (ref 135–145)
Total Bilirubin: 0.5 mg/dL (ref 0.0–1.2)
Total Protein: 7.6 g/dL (ref 6.5–8.1)

## 2024-05-28 LAB — IRON AND TIBC
Iron: 68 ug/dL (ref 45–182)
Saturation Ratios: 16 % — ABNORMAL LOW (ref 17.9–39.5)
TIBC: 421 ug/dL (ref 250–450)
UIBC: 353 ug/dL

## 2024-05-28 LAB — SEDIMENTATION RATE: Sed Rate: 42 mm/h — ABNORMAL HIGH (ref 0–16)

## 2024-05-28 LAB — LACTATE DEHYDROGENASE: LDH: 96 U/L — ABNORMAL LOW (ref 98–192)

## 2024-05-28 LAB — C-REACTIVE PROTEIN: CRP: 0.6 mg/dL (ref <1.0)

## 2024-05-28 LAB — FERRITIN: Ferritin: 126 ng/mL (ref 24–336)

## 2024-05-28 LAB — VITAMIN B12: Vitamin B-12: 686 pg/mL (ref 180–914)

## 2024-05-28 NOTE — Assessment & Plan Note (Signed)
 Chronic leukocytosis with WBC count elevated since 2018, peaking at 14,000 in September 2024.  Recent count was 11,700. Differential includes chronic inflammation, bone marrow dysfunction, or low likelihood of leukemia due to long duration without significant change.  Labs today revealed white count of 12,500 with ANC of 8400, normal differential.  Hemoglobin normal at 13.8, platelet count normal at 376,000.  CMP unremarkable.  LDH, ferritin normal.  We did check ESR, CRP to look for any evidence of inflammation.  Will also check flow cytometry of peripheral blood, BCR/ABL 1, JAK2 mutation analysis to rule out any primary myeloproliferative neoplasm.  We will discuss results over the phone in 2 weeks.  RTC in 4 months for follow-up with repeat labs

## 2024-05-28 NOTE — Assessment & Plan Note (Signed)
 Mild anemia with hemoglobin slightly below normal range (12.8 g/dL). No previous history of anemia. Not a major concern at this time.  Repeat hemoglobin today is actually normal at 13.8, MCV normal at 92.  Ferritin normal.  B12, folic acid  are pending.  Will follow-up on results.

## 2024-05-28 NOTE — Progress Notes (Signed)
 [facility]  HEMATOLOGY CLINIC CONSULTATION NOTE   PATIENT NAME: Roy Avila   MR#: [id number] DOB: [birth year]  DATE OF SERVICE: 05/28/2024   REFERRING PROVIDER  Kennyth Worth HERO, MD   Patient Care Team: Kennyth Worth HERO, MD as PCP - General (Family Medicine) Rollin Dover, MD as Consulting Physician (Gastroenterology)   REASON FOR CONSULTATION/ CHIEF COMPLAINT:  Evaluation of mild leukocytosis, thrombocytosis and mild anemia.    ASSESSMENT & PLAN:  Roy Avila is a 73 y.o. gentleman with a past medical history of hypertension, diabetes mellitus type 2, peripheral neuropathy, 2V CAD, rosacea, was referred to our service for evaluation of mild leukocytosis, thrombocytosis and mild anemia.    Leukocytosis Chronic leukocytosis with WBC count elevated since 2018, peaking at 14,000 in September 2024.  Recent count was 11,700. Differential includes chronic inflammation, bone marrow dysfunction, or low likelihood of leukemia due to long duration without significant change.  Labs today revealed white count of 12,500 with ANC of 8400, normal differential.  Hemoglobin normal at 13.8, platelet count normal at 376,000.  CMP unremarkable.  LDH, ferritin normal.  We did check ESR, CRP to look for any evidence of inflammation.  Will also check flow cytometry of peripheral blood, BCR/ABL 1, JAK2 mutation analysis to rule out any primary myeloproliferative neoplasm.  We will discuss results over the phone in 2 weeks.  RTC in 4 months for follow-up with repeat labs  Normocytic anemia Mild anemia with hemoglobin slightly below normal range (12.8 g/dL). No previous history of anemia. Not a major concern at this time.  Repeat hemoglobin today is actually normal at 13.8, MCV normal at 92.  Ferritin normal.  B12, folic acid  are pending.  Will follow-up on results.   Rosacea Rosacea managed with intermittent doxycycline  as needed. No acute issues reported.  I reviewed  lab results and outside records for this visit and discussed relevant results with the patient. Diagnosis, plan of care and treatment options were also discussed in detail with the patient. Opportunity provided to ask questions and answers provided to his apparent satisfaction. Provided instructions to call our clinic with any problems, questions or concerns prior to return visit. I recommended to continue follow-up with PCP and sub-specialists. He verbalized understanding and agreed with the plan. No barriers to learning was detected.  Chinita Patten, MD  05/28/2024 5:25 PM  [facility] - A DEPT OF [facility] [id number] [place] [address] Dept: [phone] Dept Fax: [phone]   HISTORY OF PRESENT ILLNESS:   Discussed the use of AI scribe software for clinical note transcription with the patient, who gave verbal consent to proceed.  History of Present Illness Roy Avila is a 73 year old male who presents with mild anemia and elevated white blood cell and platelet counts. He was referred by Dr. Kennyth for evaluation of mild anemia and elevated white blood cell and platelet counts.  On 04/26/2024, labs at his PCPs office showed white count of 11,700, platelet count of 421,000, hemoglobin 12.8, MCV 91.  He was referred to us  for further evaluation of these lab abnormalities.  On review of records, he has had chronic, mild leukocytosis likely since 2018 with white count ranging anywhere between 11,000-14,000.  Hemoglobin and platelet count were previously within normal limits.  His white blood cell count has been slightly elevated since at least 2018, with the highest recorded at 14,000 in September 2024.  The most recent count in May 2025 was 11,700. His platelet count was 421,000 in May 2025, with a previous high of 445,000 in September 2024.  He experiences burning and numbness in his feet, which started this  year and has progressively worsened. He describes his feet as feeling weak rather than off balance. This has impacted his ability to walk as much as he used to, and he notes that the burning sensation occurs more frequently now than it did initially.  No recurrent infections, fevers, chills, or night sweats. His appetite is good, and he has intentionally lost 40 pounds this year using Mounjaro . No unintentional weight loss.  He has a history of social smoking but has quit. He is currently taking prednisone  for a recent severe poison ivy reaction and doxycycline  intermittently for rosacea. He has a history of heart issues, with a family history prompting a calcium  test that led to further cardiac evaluation, including catheterization, which revealed some blockages. However, he has no symptoms and was advised against surgery. He also had a recent gallbladder issue with pain that resolved after dietary changes.      MEDICAL HISTORY Past Medical History:  Diagnosis Date   Abnormal LFTs (liver function tests), monitoring, likely due to fatty liver, may need US  01/27/2017   Anxiety    Back pain    Chronic otitis media after insertion of tympanic ventilation tube, right 07/13/2018   Depression    GERD (gastroesophageal reflux disease)    High blood sugar    Hyperlipidemia    Hypertension    Obstructive sleep apnea syndrome 01/22/2017   Prediabetes    Sleep apnea    Swallowing difficulty      SURGICAL HISTORY Past Surgical History:  Procedure Laterality Date   HERNIA REPAIR     LEFT HEART CATH AND CORONARY ANGIOGRAPHY N/A 08/24/2023   Procedure: LEFT HEART CATH AND CORONARY ANGIOGRAPHY;  Surgeon: Swazil[place], Peter M, MD;  Location: [facility]AS[facility];  Service: Cardiovascular;  Laterality: N/A;     SOCIAL HISTORY: He reports that he has quit smoking. His smoking use included cigarettes. He has never used smokeless tobacco. He reports current alcohol use. He reports that he does not use  drugs. Social History   Socioeconomic History   Marital status: Married    Spouse name: Sharman   Number of children: 2   Years of education: college 4   Highest education level: Associate degree: occupational, Scientist, product/process development, or vocational program  Occupational History   Occupation: Retired from [employer]   Occupation: Sales  Tobacco Use   Smoking status: Former    Types: Cigarettes   Smokeless tobacco: Never   Tobacco comments:    Social smoker, 1 pack per week  Substance and Sexual Activity   Alcohol use: Yes    Comment: social drink   Drug use: No   Sexual activity: Yes    Partners: Female    Birth control/protection: None  Other Topics Concern   Not on file  Social History Narrative   Not on file   Social Drivers of Health   Financial Resource Strain: Low Risk  (01/02/2024)   Overall Financial Resource Strain (CARDIA)    Difficulty of Paying Living Expenses: Not hard at all  Food Insecurity: No Food Insecurity (05/28/2024)   Hunger Vital Sign    Worried About Running Out of Food in the Last Year: Never true    Ran Out of Food in the Last Year: Never true  Transportation Needs:  No Transportation Needs (05/28/2024)   PRAPARE - Administrator, Civil Service (Medical): No    Lack of Transportation (Non-Medical): No  Physical Activity: Insufficiently Active (01/02/2024)   Exercise Vital Sign    Days of Exercise per Week: 2 days    Minutes of Exercise per Session: 30 min  Stress: No Stress Concern Present (01/02/2024)   [employer] of Occupational Health - Occupational Stress Questionnaire    Feeling of Stress : Only a little  Social Connections: Moderately Isolated (01/02/2024)   Social Connection and Isolation Panel    Frequency of Communication with Friends and Family: Three times a week    Frequency of Social Gatherings with Friends and Family: Once a week    Attends Religious Services: Never    Database administrator or Organizations: No    Attends Museum/gallery exhibitions officer: Not on file    Marital Status: Married  Catering manager Violence: Not At Risk (05/28/2024)   Humiliation, Afraid, Rape, and Kick questionnaire    Fear of Current or Ex-Partner: No    Emotionally Abused: No    Physically Abused: No    Sexually Abused: No    FAMILY HISTORY: His family history includes Breast cancer in his mother; Heart disease in his father; Hyperlipidemia in his father; Hypertension in his father and mother; Obesity in his mother; Sudden death in his father.  CURRENT MEDICATIONS   Current Outpatient Medications  Medication Instructions   ALPRAZolam  (XANAX ) 0.5 mg, Oral, 2 times daily PRN   amLODipine  (NORVASC ) 5 MG tablet TAKE 1 TABLET DAILY   aspirin  EC 81 mg, Oral, Daily, Swallow whole.   atorvastatin  (LIPITOR) 40 mg, Oral, Daily   Clobetasol Propionate 0.05 % shampoo 1 Application, Every other day   cyanocobalamin  (VITAMIN B12) 1000 MCG/ML injection Inject 1ml next week then once per month for 3 month   doxycycline  (VIBRAMYCIN ) 100 mg, 2 times daily   esomeprazole  (NEXIUM ) 40 mg, Oral, Daily   metFORMIN  (GLUCOPHAGE -XR) 500 mg, Oral, Daily at bedtime   metoprolol  succinate (TOPROL  XL) 25 mg, Oral, Daily   nitroGLYCERIN  (NITROSTAT ) 0.4 mg, Sublingual, Every 5 min PRN   olmesartan -hydrochlorothiazide (BENICAR  HCT) 40-25 MG tablet 1 tablet, Oral, Daily   predniSONE  (DELTASONE ) 10 MG tablet Start 20 mg daily x 1 week, then decrease to 10 mg daily x 1 week.   tirzepatide  (MOUNJARO ) 7.5 mg, Subcutaneous, Weekly     ALLERGIES  He is allergic to clarithromycin.  REVIEW OF SYSTEMS:  Review of Systems - Oncology   Rest of the pertinent review of systems is unremarkable except as mentioned above in HPI.  PHYSICAL EXAMINATION:     Onc Performance Status - 05/28/24 1444       ECOG Perf Status   ECOG Perf Status Restricted in physically strenuous activity but ambulatory and able to carry out work of a light or sedentary nature, e.g., light  house work, office work      KPS SCALE   KPS % SCORE Normal activity with effort, some s/s of disease          Vitals:   05/28/24 1413  BP: (!) 142/62  Pulse: 68  Resp: 18  Temp: 98.4 F (36.9 C)  SpO2: 98%   Filed Weights   05/28/24 1413  Weight: 226 lb 6.4 oz (102.7 kg)    Physical Exam Constitutional:      General: He is not in acute distress.    Appearance: Normal appearance.  HENT:  Head: Normocephalic and atraumatic.   Eyes:     Conjunctiva/sclera: Conjunctivae normal.    Cardiovascular:     Rate and Rhythm: Normal rate and regular rhythm.  Pulmonary:     Effort: Pulmonary effort is normal. No respiratory distress.  Abdominal:     General: There is no distension.   Neurological:     General: No focal deficit present.     Mental Status: He is alert and oriented to person, place, and time.   Psychiatric:        Mood and Affect: Mood normal.        Behavior: Behavior normal.     LABORATORY DATA:   I have reviewed the data as listed.  Results for orders placed or performed in visit on 05/28/24  Lactate dehydrogenase  Result Value Ref Range   LDH 96 (L) 98 - 192 U/L  Ferritin  Result Value Ref Range   Ferritin 126 24 - 336 ng/mL  CMP (Cancer Center only)  Result Value Ref Range   Sodium 136 135 - 145 mmol/L   Potassium 3.5 3.5 - 5.1 mmol/L   Chloride 98 98 - 111 mmol/L   CO2 27 22 - 32 mmol/L   Glucose, Bld 102 (H) 70 - 99 mg/dL   BUN 16 8 - 23 mg/dL   Creatinine 9.20 9.38 - 1.24 mg/dL   Calcium  9.6 8.9 - 10.3 mg/dL   Total Protein 7.6 6.5 - 8.1 g/dL   Albumin 4.1 3.5 - 5.0 g/dL   AST 18 15 - 41 U/L   ALT 20 0 - 44 U/L   Alkaline Phosphatase 111 38 - 126 U/L   Total Bilirubin 0.5 0.0 - 1.2 mg/dL   GFR, Estimated >39 >39 mL/min   Anion gap 11 5 - 15  CBC with Differential (Cancer Center Only)  Result Value Ref Range   WBC Count 12.5 (H) 4.0 - 10.5 K/uL   RBC 4.40 4.22 - 5.81 MIL/uL   Hemoglobin 13.8 13.0 - 17.0 g/dL   HCT 59.4  60.9 - 47.9 %   MCV 92.0 80.0 - 100.0 fL   MCH 31.4 26.0 - 34.0 pg   MCHC 34.1 30.0 - 36.0 g/dL   RDW 84.5 88.4 - 84.4 %   Platelet Count 376 150 - 400 K/uL   nRBC 0.2 0.0 - 0.2 %   Neutrophils Relative % 67 %   Neutro Abs 8.4 (H) 1.7 - 7.7 K/uL   Lymphocytes Relative 20 %   Lymphs Abs 2.5 0.7 - 4.0 K/uL   Monocytes Relative 10 %   Monocytes Absolute 1.2 (H) 0.1 - 1.0 K/uL   Eosinophils Relative 2 %   Eosinophils Absolute 0.3 0.0 - 0.5 K/uL   Basophils Relative 0 %   Basophils Absolute 0.0 0.0 - 0.1 K/uL   Immature Granulocytes 1 %   Abs Immature Granulocytes 0.07 0.00 - 0.07 K/uL     RADIOGRAPHIC STUDIES:  No pertinent imaging studies available to review.  Orders Placed This Encounter  Procedures   CBC with Differential (Cancer Center Only)    Standing Status:   Future    Number of Occurrences:   1    Expiration Date:   05/28/2025   CMP (Cancer Center only)    Standing Status:   Future    Number of Occurrences:   1    Expiration Date:   05/28/2025   Iron and TIBC    Standing Status:   Future    Number  of Occurrences:   1    Expiration Date:   05/28/2025   Ferritin    Standing Status:   Future    Number of Occurrences:   1    Expiration Date:   05/28/2025   Folate    Standing Status:   Future    Number of Occurrences:   1    Expiration Date:   05/28/2025   Vitamin B12    Standing Status:   Future    Number of Occurrences:   1    Expiration Date:   05/28/2025   Methylmalonic acid, serum    Standing Status:   Future    Number of Occurrences:   1    Expiration Date:   05/28/2025   Lactate dehydrogenase    Standing Status:   Future    Number of Occurrences:   1    Expiration Date:   05/28/2025   Haptoglobin    Standing Status:   Future    Number of Occurrences:   1    Expiration Date:   05/28/2025   C-reactive protein    Standing Status:   Future    Number of Occurrences:   1    Expiration Date:   05/28/2025   Sedimentation rate    Standing Status:   Future     Number of Occurrences:   1    Expiration Date:   05/28/2025   Flow Cytometry, Peripheral Blood (Oncology)    Standing Status:   Future    Number of Occurrences:   1    Expiration Date:   05/28/2025   JAK2 V617F rfx CALR/MPL/E12-15    Standing Status:   Future    Number of Occurrences:   1    Expiration Date:   05/28/2025   BCR-ABL1 FISH    Standing Status:   Future    Number of Occurrences:   1    Expiration Date:   05/28/2025    Future Appointments  Date Time Provider Department Center  06/11/2024  3:45 PM Maurine Mowbray, Chinita, MD [facility] None  07/02/2024  1:00 PM Kennyth Worth HERO, MD [facility]  10/08/2024  2:00 PM [facility] PHLEBOTOMIST [facility] None  10/08/2024  2:30 PM Khalik Pewitt, Chinita, MD [facility] None    I spent a total of 47 minutes during this encounter with the patient including review of chart and various tests results, discussions about plan of care and coordination of care plan.  This document was completed utilizing speech recognition software. Grammatical errors, random word insertions, pronoun errors, and incomplete sentences are an occasional consequence of this system due to software limitations, ambient noise, and hardware issues. Any formal questions or concerns about the content, text or information contained within the body of this dictation should be directly addressed to the provider for clarification.

## 2024-05-28 NOTE — Telephone Encounter (Signed)
 Copied from CRM [id number]. Topic: Clinical - Red Word Triage >> May 28, 2024  9:07 AM Franky GRADE wrote: Red Word that prompted transfer to Nurse Triage: Patient was prescribed predniSONE  (DELTASONE ) 10 MG tablet [[id number]] and suppose to take two pills daily for the first week and one pill a day for the second week; however, he read the instructions wrong and took 1 pill the first week and now he's concerned on what he should do next. Reason for Disposition  [1] Caller has URGENT medicine question about med that PCP or specialist prescribed AND [2] triager unable to answer question    Prednisone  question  Answer Assessment - Initial Assessment Questions 1. NAME of MEDICINE: What medicine(s) are you calling about?     Prednisone  2. QUESTION: What is your question? (e.g., double dose of medicine, side effect)     See pt's message. Start 20 mg daily for a week.   I read it wrong.  I was supposed to take 2 pills the first week.  I only took one pill last week.   Now I'm not sure what I'm supposed to do.   Should I take 1 pill for the next   I had a bad rash from poison Ivy     It's going away but not fast as it should since I wasn't taking the proper dose.  3. PRESCRIBER: Who prescribed the medicine? Reason: if prescribed by specialist, call should be referred to that group.     Dr. Job 4. SYMPTOMS: Do you have any symptoms? If Yes, ask: What symptoms are you having?  How bad are the symptoms (e.g., mild, moderate, severe)     Dr. Kennyth 5. PREGNANCY:  Is there any chance that you are pregnant? When was your last menstrual period?     N/A  Protocols used: Medication Question Call-A-AH FYI Only or Action Required?: Action required by provider: clinical question for provider.  Patient was last seen in primary care on 05/18/2024 by Job Lukes, PA. Called Nurse Triage reporting Medication Problem. Symptoms began a week ago. Interventions attempted: Prescription medications:  prednisone   He took one pill a day instead of 2 pills a day all last week.  Seeking advice on how many he should take now. Symptoms are: gradually improving.  Triage Disposition: Call PCP Now  Patient/caregiver understands and will follow disposition?: YesFYI Only or Action Required?: Action required by provider: clinical question for provider.  Patient was last seen in primary care on 05/18/2024 by Job Lukes, PA. Called Nurse Triage reporting Medication Problem. Symptoms began a week ago. Interventions attempted: Prescription medications: prednisone . Symptoms are: gradually improving.  Triage Disposition: Call PCP Now  Patient/caregiver understands and will follow disposition?: Yes

## 2024-05-29 LAB — HAPTOGLOBIN: Haptoglobin: 202 mg/dL (ref 34–355)

## 2024-05-29 NOTE — Telephone Encounter (Signed)
 Spoke to pt told him per Lucie, He can complete medication by taking 20 mg daily each day until he is out of medication. Pt verbalized understanding.

## 2024-05-29 NOTE — Telephone Encounter (Signed)
 Samantha please see Triage note and advise. Pt saw you on 6/20 and took medication prescribed wrong.

## 2024-06-01 LAB — METHYLMALONIC ACID, SERUM: Methylmalonic Acid, Quantitative: 130 nmol/L (ref 0–378)

## 2024-06-05 LAB — JAK2 V617F RFX CALR/MPL/E12-15

## 2024-06-05 LAB — BCR-ABL1 FISH
Cells Analyzed: 200
Cells Counted: 200

## 2024-06-05 LAB — CALR +MPL + E12-E15  (REFLEX)

## 2024-06-11 ENCOUNTER — Inpatient Hospital Stay: Attending: Oncology | Admitting: Oncology

## 2024-06-11 ENCOUNTER — Encounter: Payer: Self-pay | Admitting: Oncology

## 2024-06-11 DIAGNOSIS — D72829 Elevated white blood cell count, unspecified: Secondary | ICD-10-CM | POA: Diagnosis not present

## 2024-06-11 DIAGNOSIS — D649 Anemia, unspecified: Secondary | ICD-10-CM | POA: Diagnosis not present

## 2024-06-11 DIAGNOSIS — L719 Rosacea, unspecified: Secondary | ICD-10-CM

## 2024-06-11 NOTE — Assessment & Plan Note (Signed)
 Current severe flare-up of rosacea ongoing since last Thursday. May contribute to systemic inflammation and elevated WBC count. - Plans to see a dermatologist for management.

## 2024-06-11 NOTE — Assessment & Plan Note (Signed)
 Chronic leukocytosis with WBC count elevated since 2018, peaking at 14,000 in September 2024.  Recent count was 11,700. Differential includes chronic inflammation, bone marrow dysfunction, or low likelihood of leukemia due to long duration without significant change.  On his consultation with us  on 05/28/2024, labs revealed white count of 12,500 with ANC of 8400, normal differential.  Hemoglobin normal at 13.8, platelet count normal at 376,000.  CMP unremarkable.  Vitamin B12, iron studies, folate, LDH, ferritin, haptoglobin were all normal.  CRP was normal at 0.6.  Sed rate was increased at 42 mm/h.  BCR/ABL 1 was negative.  JAK2 V617F mutation analysis, followed by reflex testing to include CALR, MPL, exon 12-15 mutations were all negative.  No evidence of primary myeloproliferative neoplasm.  Mild chronic leukocytosis is likely reactionary to chronic inflammation.  Will continue monitoring.  RTC in 4 months for follow-up with repeat labs

## 2024-06-11 NOTE — Assessment & Plan Note (Signed)
 Mild anemia with hemoglobin slightly below normal range (12.8 g/dL). No previous history of anemia. Not a major concern at this time.  Repeat hemoglobin on 05/28/24 was actually normal at 13.8, MCV normal at 92.  Ferritin normal.  B12, folic acid  were all normal.

## 2024-06-11 NOTE — Progress Notes (Signed)
 [facility]  HEMATOLOGY-ONCOLOGY ELECTRONIC VISIT PROGRESS NOTE  PATIENT NAME: Roy Avila   MR#: [id number] DOB: Aug 04, 1951  DATE OF SERVICE: 06/11/2024  Patient Care Team: Kennyth Worth HERO, MD as PCP - General (Family Medicine) Rollin Dover, MD as Consulting Physician (Gastroenterology)  I connected with the patient via telephone conference and verified that I am speaking with the correct person using two identifiers. The patient's location is at home and I am providing care from the [facility].  I discussed the limitations, risks, security and privacy concerns of performing an evaluation and management service by e-visits and the availability of in person appointments. I also discussed with the patient that there may be a patient responsible charge related to this service. The patient expressed understanding and agreed to proceed.   ASSESSMENT & PLAN:   Roy Avila is a 73 y.o. gentleman with a past medical history of hypertension, diabetes mellitus type 2, peripheral neuropathy, 2V CAD, rosacea, was referred to our service in June 2025 for evaluation of mild leukocytosis, thrombocytosis and mild anemia.    Leukocytosis Chronic leukocytosis with WBC count elevated since 2018, peaking at 14,000 in September 2024.  Recent count was 11,700. Differential includes chronic inflammation, bone marrow dysfunction, or low likelihood of leukemia due to long duration without significant change.  On his consultation with us  on 05/28/2024, labs revealed white count of 12,500 with ANC of 8400, normal differential.  Hemoglobin normal at 13.8, platelet count normal at 376,000.  CMP unremarkable.  Vitamin B12, iron studies, folate, LDH, ferritin, haptoglobin were all normal.  CRP was normal at 0.6.  Sed rate was increased at 42 mm/h.  BCR/ABL 1 was negative.  JAK2 V617F mutation analysis, followed by reflex testing to include CALR, MPL, exon 12-15 mutations were all negative.  No  evidence of primary myeloproliferative neoplasm.  Mild chronic leukocytosis is likely reactionary to chronic inflammation.  Will continue monitoring.  RTC in 4 months for follow-up with repeat labs  Normocytic anemia Mild anemia with hemoglobin slightly below normal range (12.8 g/dL). No previous history of anemia. Not a major concern at this time.  Repeat hemoglobin on 05/28/24 was actually normal at 13.8, MCV normal at 92.  Ferritin normal.  B12, folic acid  were all normal.   Rosacea Current severe flare-up of rosacea ongoing since last Thursday. May contribute to systemic inflammation and elevated WBC count. - Plans to see a dermatologist for management.   I discussed the assessment and treatment plan with the patient. The patient was provided an opportunity to ask questions and all were answered. The patient agreed with the plan and demonstrated an understanding of the instructions. The patient was advised to call back or seek an in-person evaluation if the symptoms worsen or if the condition fails to improve as anticipated.    I spent 11 minutes over the phone with the patient reviewing test results, discuss management and coordination/planning of care.  Chinita Patten, MD 06/11/2024 9:58 PM [facility] - A DEPT OF [facility] [address] [place] [address] Dept: [phone] Dept Fax: [phone]   INTERVAL HISTORY:  Please see above for problem oriented charting.  The purpose of today's discussion is to explain recent lab results and to formulate plan of care.  Discussed the use of AI scribe software for clinical note transcription with the patient, who gave verbal consent to proceed.  History of Present Illness Roy Petrovich  Avila is a 73 year old male with a history of elevated white blood cell count who presents for follow-up of his hematological workup.  He has had an elevated white blood cell  count ranging from 11,000 to 14,000 since at least 2018, with the most recent count being 12,500. His red blood cell and platelet counts are normal. Recent tests showed normal iron levels, vitamin B12, and folic acid . There are no signs of cell breakdown or mutations from the bone marrow, and tests for leukemia and other bone marrow disorders were negative.  He has a history of diabetes, which is well-controlled since starting Mounjaro . He states that 'everything's been better' with respect to his diabetes management.  He also has a history of rosacea, which is currently not under control. He is experiencing a significant flare-up that began last Thursday and is ongoing. He is scheduled to see a dermatologist tomorrow. He notes that the rosacea 'comes and goes' and was not bad at the time of his recent blood test.  An inflammatory marker, sed rate, was found to be elevated.   SUMMARY OF HEMATOLOGY HISTORY:  He was referred by Dr. Kennyth for evaluation of mild anemia and elevated white blood cell and platelet counts.   On 04/26/2024, labs at his PCPs office showed white count of 11,700, platelet count of 421,000, hemoglobin 12.8, MCV 91.  He was referred to us  for further evaluation of these lab abnormalities.  On review of records, he has had chronic, mild leukocytosis likely since 2018 with white count ranging anywhere between 11,000-14,000.  Hemoglobin and platelet count were previously within normal limits.   His white blood cell count has been slightly elevated since at least 2018, with the highest recorded at 14,000 in September 2024. The most recent count in May 2025 was 11,700. His platelet count was 421,000 in May 2025, with a previous high of 445,000 in September 2024.   He experiences burning and numbness in his feet, which started this year and has progressively worsened. He describes his feet as feeling weak rather than off balance. This has impacted his ability to walk as much as he used  to, and he notes that the burning sensation occurs more frequently now than it did initially.   No recurrent infections, fevers, chills, or night sweats. His appetite is good, and he has intentionally lost 40 pounds this year using Mounjaro . No unintentional weight loss.   He has a history of social smoking but has quit. He is currently taking prednisone  for a recent severe poison ivy reaction and doxycycline  intermittently for rosacea. He has a history of heart issues, with a family history prompting a calcium  test that led to further cardiac evaluation, including catheterization, which revealed some blockages. However, he has no symptoms and was advised against surgery. He also had a recent gallbladder issue with pain that resolved after dietary changes.  Chronic leukocytosis with WBC count elevated since 2018, peaking at 14,000 in September 2024.  Recent count was 11,700. Differential includes chronic inflammation, bone marrow dysfunction, or low likelihood of leukemia due to long duration without significant change.   On his consultation with us  on 05/28/2024, labs revealed white count of 12,500 with ANC of 8400, normal differential.  Hemoglobin normal at 13.8, platelet count normal at 376,000.  CMP unremarkable.  Vitamin B12, iron studies, folate, LDH, ferritin, haptoglobin were all normal.  CRP was normal at 0.6.  Sed rate was increased at 42 mm/h.  BCR/ABL 1 was negative.  JAK2 V617F mutation analysis, followed by reflex testing to include CALR, MPL, exon 12-15 mutations were all negative.  No evidence of primary myeloproliferative neoplasm.  Mild chronic leukocytosis is likely reactionary to chronic inflammation.  Will continue monitoring.  REVIEW OF SYSTEMS:    Review of Systems - Oncology  All other pertinent systems were reviewed with the patient and are negative.  I have reviewed the past medical history, past surgical history, social history and family history with the patient and they  are unchanged from previous note.  ALLERGIES:  He is allergic to clarithromycin.  MEDICATIONS:  Current Outpatient Medications  Medication Sig Dispense Refill   ALPRAZolam  (XANAX ) 0.5 MG tablet Take 1 tablet (0.5 mg total) by mouth 2 (two) times daily as needed for anxiety. 20 tablet 0   amLODipine  (NORVASC ) 5 MG tablet TAKE 1 TABLET DAILY 90 tablet 3   aspirin  EC 81 MG tablet Take 1 tablet (81 mg total) by mouth daily. Swallow whole.     atorvastatin  (LIPITOR) 40 MG tablet Take 1 tablet (40 mg total) by mouth daily. 90 tablet 1   Clobetasol Propionate 0.05 % shampoo Apply 1 Application topically every other day.     cyanocobalamin  (VITAMIN B12) 1000 MCG/ML injection Inject 1ml next week then once per month for 3 month 4 mL 0   doxycycline  (VIBRAMYCIN ) 100 MG capsule Take 100 mg by mouth 2 (two) times daily.     esomeprazole  (NEXIUM ) 40 MG capsule Take 1 capsule (40 mg total) by mouth daily. 90 capsule 1   metFORMIN  (GLUCOPHAGE -XR) 500 MG 24 hr tablet Take 1 tablet (500 mg total) by mouth at bedtime. 90 tablet 1   metoprolol  succinate (TOPROL  XL) 25 MG 24 hr tablet Take 1 tablet (25 mg total) by mouth daily. 90 tablet 3   nitroGLYCERIN  (NITROSTAT ) 0.4 MG SL tablet Place 1 tablet (0.4 mg total) under the tongue every 5 (five) minutes as needed for chest pain. 90 tablet 3   olmesartan -hydrochlorothiazide (BENICAR  HCT) 40-25 MG tablet Take 1 tablet by mouth daily. 90 tablet 1   predniSONE  (DELTASONE ) 10 MG tablet Start 20 mg daily x 1 week, then decrease to 10 mg daily x 1 week. 21 tablet 0   tirzepatide  (MOUNJARO ) 7.5 MG/0.5ML Pen Inject 7.5 mg into the skin once a week. 6 mL 3   No current facility-administered medications for this visit.    PHYSICAL EXAMINATION:    Onc Performance Status - 06/11/24 2100       ECOG Perf Status   ECOG Perf Status Restricted in physically strenuous activity but ambulatory and able to carry out work of a light or sedentary nature, e.g., light house work,  office work      KPS SCALE   KPS % SCORE Able to carry on normal activity, minor s/s of disease          LABORATORY DATA:   I have reviewed the data as listed.  Recent Results (from the past 2160 hours)  CBC     Status: Abnormal   Collection Time: 04/26/24  2:50 PM  Result Value Ref Range   WBC 11.7 (H) 4.0 - 10.5 K/uL   RBC 4.21 (L) 4.22 - 5.81 Mil/uL   Platelets 421.0 (H) 150.0 - 400.0 K/uL   Hemoglobin 12.8 (L) 13.0 - 17.0 g/dL   HCT 61.5 (L) 60.9 - 47.9 %   MCV 91.3 78.0 - 100.0 fl   MCHC 33.3 30.0 - 36.0 g/dL   RDW 83.6 (H)  11.5 - 15.5 %  Comprehensive metabolic panel with GFR     Status: None   Collection Time: 04/26/24  2:50 PM  Result Value Ref Range   Sodium 136 135 - 145 mEq/L   Potassium 3.9 3.5 - 5.1 mEq/L   Chloride 100 96 - 112 mEq/L   CO2 28 19 - 32 mEq/L   Glucose, Bld 87 70 - 99 mg/dL   BUN 17 6 - 23 mg/dL   Creatinine, Ser 9.17 0.40 - 1.50 mg/dL   Total Bilirubin 0.6 0.2 - 1.2 mg/dL   Alkaline Phosphatase 107 39 - 117 U/L   AST 23 0 - 37 U/L   ALT 22 0 - 53 U/L   Total Protein 7.5 6.0 - 8.3 g/dL   Albumin 3.9 3.5 - 5.2 g/dL   GFR 11.91 >39.99 mL/min    Comment: Calculated using the CKD-EPI Creatinine Equation (2021)   Calcium  9.5 8.4 - 10.5 mg/dL  TSH     Status: None   Collection Time: 04/26/24  2:50 PM  Result Value Ref Range   TSH 2.42 0.35 - 5.50 uIU/mL  Vitamin B12     Status: None   Collection Time: 04/26/24  2:50 PM  Result Value Ref Range   Vitamin B-12 299 211 - 911 pg/mL  Folate     Status: Abnormal   Collection Time: 04/26/24  2:50 PM  Result Value Ref Range   Folate 5.9 (L) >5.9 ng/mL  Magnesium      Status: None   Collection Time: 04/26/24  2:50 PM  Result Value Ref Range   Magnesium  1.9 1.5 - 2.5 mg/dL  Folate     Status: None   Collection Time: 05/28/24  3:10 PM  Result Value Ref Range   Folate 6.3 >5.9 ng/mL    Comment: Performed at [facility], [address], [place], [place] [address]  Sedimentation rate      Status: Abnormal   Collection Time: 05/28/24  3:25 PM  Result Value Ref Range   Sed Rate 42 (H) 0 - 16 mm/hr    Comment: Performed at [facility], [address], [place], [place] [address]  BCR-ABL1 FISH     Status: None   Collection Time: 05/28/24  3:25 PM  Result Value Ref Range   Specimen Type BLOOD    Cells Counted 200    Cells Analyzed 200    FISH Result ABL1 GENE FUSION     Comment: Comment: NO BCR    Interpretation Comment:     Comment: (NOTE) NEGATIVE             nuc ish 9q34(ASS1,ABL1)x2,22q11.2(BCRx2)[200].      The fluorescence in situ hybridization (FISH) study was normal. FISH, using unique sequence DNA probes for the ABL1 and BCR gene regions showed two ABL1 signals (red), two control ASS1 gene signals (aqua) located adjacent to the ABL1 locus at 9q34, and two BCR signals (green) at 22q11.2 in all interphase nuclei examined. There was NO evidence of CML or ALL-associated BCR::ABL1 dual fusion signals in this analysis. .      In those cases where blood is submitted for BCR::ABL1 FISH, the results should be interpreted in conjunction with PMN ratios. .      Chromosome analysis should be considered to identify clonal alterations not targeted by the FISH probes ordered. The TargetGene FISH result should be interpreted within the context of a full hematopatholgy evaluation. The DNA probe vendor for this study was Glass blower/designer (  [employer]).        This test was developed and its performance char acteristics determined by [employer] It has not been cleared or approved by the U.S. Food and Drug Administration.    Director Review: Comment:     Comment: (NOTE) Iantha Passer, PhD, [facility], Professional Component performed by, [employer], [place] [id number], [address], [place], [place] [address]. Medical Director, Aniceto Christen, MD,PhD Performed At: [facility] [place] [address] [place], [place] [id number] Christen Aniceto MDPhD Ey:[phone]   JAK2 V617F rfx CALR/MPL/E12-15     Status: None   Collection Time: 05/28/24  3:25 PM  Result Value Ref Range   Specimen Type Comment:     Comment: NOT PROVIDED CORRECTED ON 07/08 AT 1436: PREVIOUSLY REPORTED AS DRAWN BY RN    JAK2 V617F Result Comment     Comment: (NOTE) NEGATIVE The JAK2 V617F mutation is not detected in the provided specimen of this individual. Results should be interpreted in conjunction with clinical and other laboratory findings for the most accurate interpretation. This test was developed and its performance characteristics determined by [facility]. It has not been cleared or approved by the Food and Drug Administration.    Reflex Comment     Comment: (NOTE) Reflex to CALR Mutation Analysis, JAK2 Exon 12-15 Mutation Analysis, and MPL Mutation Analysis is indicated.    V617F Rfx CALR/MPL/E12-15 Bkgd Comment     Comment: (NOTE)    Molecular testing of blood or bone marrow is useful in the evaluation of suspected myeloproliferative neoplasms (MPN). Mutations in the JAK2, MPL, and CALR genes are present in virtually all MPNs and their presence help distinguish benign reactive processes from clonal neoplasms. These mutations are generally considered mutually exclusive, although concurrent clones have been reported in rare patients. This test will assess for the JAK2V617F (exon 14) mutation first and will reflex to CALR mutation analysis, MPL mutation analysis, and JAK2 exon 12 to 15 mutation analysis if the JAK2V617F mutation is negative.    The JAK2 (Janus kinase 2) gene encodes for a non-receptor protein tyrosine kinase that activates cytokine and growth factor signaling. The V617F (c.1849 G>T) mutation results in constitutive activation of JAK2 and downstream STAT5 and ERK signaling. The V617F mutation is observed in approximately 95% of polycythemia vera (PV), 60% of essential  thromboc ythemia (ET) and primary myelofibrosis (PMF). It is also infrequently present (3-5%) in myelodysplastic syndrome, chronic myelomonocytic leukemia, and other atypical chronic myeloid disorders. A small percentage of JAK2 mutation positive patients (3.3%) contain other non-V617F mutations within exons 12 to 15. In particular, mutations in exon 12 of JAK2 have been described in approximately 3% of patients with PV. JAK2 allele burden correlates with clinical phenotype, with low levels of mutant allele characterized by thrombocytosis, intermediate levels with erythrocytosis, and high mutant allele burden correlating with enhanced myelopoiesis of the BM, leukocytosis, increasing spleen size, and circulating CD34-positive cells.    The CALR (Calreticulin) gene encodes for a multifunctional calcium -binding protein involved in many cellular activities such as growth, proliferation, adhesion, and programmed cell death. Among patients with JAK2 negative MPNs, CALR are found in a pproximately 70% of patients with JAK2-negative essential thrombocythemia (ET) and 60-88% of patients with JAK2-negative primary myelofibrosis(PMF). Only a minority of patients (approximately 8%) with myelodysplasia have mutations in the CALR gene. CALR mutations are rarely detected in patients with de novo acute myeloid leukemia, chronic myelogenous leukemia, lymphoid leukemia, or solid  tumors. CALR mutations are not detected in polycythemia and generally appear to be mutually exclusive with JAK2 mutations and MPL mutations. The majority of mutational changes involve a variety of insertion deletion mutations in exon 9 of the calreticulin gene: approximately 53% of all CALR mutations are a 52 bp deletion (type-1) while the second most prevalent mutation (approximately 32%) contains a 5 bp insertion (type-2). Other mutations (non-type 1 or type 2) are seen in a small minority of cases. CALR mutations in PMF tend  to be with a favorable prognosis compared to JAK2 V617F [employer], whereas primary myelofibrosis negative for CALR, JAK2 V617F and MPL mutations (so-called triple negative) is associated with a poor prognosis and shorter survival.    The MPL (myeloproliferative leukemia virus oncogene) gene encodes the thrombopoietin receptor which regulates hematopoiesis and megakaryopoiesis. Activating MPL mutations are associated with a subset of myeloproliferative neoplasms and acute megakaryoblastic leukemia. MPL W515 mutations are present in approximately 5-8% of patients with primary myelofibrosis (PMF) and 1-4% of patients with essential thrombocythemia (ET). The S505 mutation is detected in patients with hereditary thrombocythemia.    Limitations    This assay has a sensitivity of approximately 1% VAF for JAK2 V617F, 2.5% VAF for other mutations in JAK2 exons 12 to 15, CALR mutations, and MPL mutations. Deletions in JAK2 up to 6 bp and insertions up to 34 bp have been detected in validation studies. Deletions in CALR up to 70 bp and i nsertions up to 12 bp have been detected in validation studies.    Method based next generation sequencing.     Comment: Comment [employer]    References Comment     Comment: (NOTE) Alghasham N, Alnouri Y, Abalkhail H, Collier RAMAN. Detection of mutations in JAK2 exons 12-15 by [employer] sequencing. Int J Lab Hematol. 2016 Feb;38(1):34-41. doi: 10.1111/ijlh.87574. Epub 2015 Sep 11. PMID: 73638915. Glorine ISLE, Cheryll LABOR, Hasserjian R, Omega PARAS, Borowitz MJ, Ladora Campanile MM, Chilton CD, Cazzola M, Vardiman JW. The 2016 revision to the World Health Organization classification of myeloid neoplasms and acute leukemia. Blood. 2016 May 19;127(20):2391-405. doi: 10.1182/blood-2016-03-643544. Epub 2016 Apr 11. PMID: 72930745. Honor LELON Glennie VEAR Laurita OLEGARIO [facility], Zhang ZJ, Hermann S, Albitar M. Mutation profile of JAK2 transcripts in patients with chronic myeloproliferative  neoplasias. J Mol Diagn. 2009 Jan;11(1):49-53.doi: 10.2353/jmoldx.2009.080114. Epub 2008 Dec 12. PMID: 80925404; PMCID: EFR7392434. NCCN Clinical Practice Guidelines in Oncology (NCCN Guidelines) Myeloproliferative Neoplasms Version 3.2022 - July 09, 2021. Swerdlow SH, Programmer, multimedia. WHO classif ication of Tumours of Haematopoietic and Lymphoid Tissues. 4th edn. Epifanio, [place]: Geologist, engineering for [employer] on [employer]; 2017. Tefferi A. Primary myelofibrosis: 2021 update on diagnosis, risk-stratification and management. Am J Hematol. 2021 Jan;96(1):145-162. doi: 10.1002/ajh.26050. Epub 2020 Dec 2. PMID: 66802950. Vainchenker W, Kralovics R. Genetic basis and molecular pathophysiology of classical myeloproliferative neoplasms. Blood. 2017 Feb 9;129(6):667-679. doi: 10.1182/blood-2016-10-695940. Epub 2016 Dec 27. PMID: 71971970.    Director Review Comment     Comment: (NOTE) Lucillie Paddock, PhD, [facility] Director, Molecular Oncology [facility] [address] [place], [place] [address] [phone] Performed At: [facility] [place] [address] [place], [place] [id number] Loran Gales MDPhD Ey:[phone] Performed At: [facility] [place] [address] [place], [place] [id number] Loran Gales MDPhD Ey:[phone]   C-reactive protein     Status: None   Collection Time: 05/28/24  3:25 PM  Result Value Ref Range   CRP 0.6 <1.0 mg/dL    Comment: Performed at [facility],  [address], [place], [place] [address]  Haptoglobin     Status: None   Collection Time: 05/28/24  3:25 PM  Result Value Ref Range   Haptoglobin 202 34 - 355 mg/dL    Comment: (NOTE) Performed At: [facility] [address] [place], [place] [id number] Jennette Shorter MD Ey:[phone]   Lactate dehydrogenase     Status: Abnormal   Collection Time: 05/28/24  3:25 PM  Result Value Ref Range   LDH 96 (L) 98 - 192 U/L    Comment: Performed at [facility], [address], [place], [place] [address]  Methylmalonic acid, serum     Status: None   Collection Time: 05/28/24  3:25 PM  Result Value Ref Range   Methylmalonic Acid, Quantitative 130 0 - 378 nmol/L    Comment: (NOTE) This test was developed and its performance characteristics determined by [facility]. It has not been cleared or approved by the Food and Drug Administration. Performed At: [facility] [address] [place], [place] [id number] Jennette Shorter MD Ey:[phone]   Vitamin B12     Status: None   Collection Time: 05/28/24  3:25 PM  Result Value Ref Range   Vitamin B-12 686 180 - 914 pg/mL    Comment: (NOTE) This assay is not validated for testing neonatal or myeloproliferative syndrome specimens for Vitamin B12 levels. Performed at [facility], [address], [place], [place] [address]   Ferritin     Status: None   Collection Time: 05/28/24  3:25 PM  Result Value Ref Range   Ferritin 126 24 - 336 ng/mL    Comment: Performed at [facility], [address] [place], [place] [address]  Iron and TIBC     Status: Abnormal   Collection Time: 05/28/24  3:25 PM  Result Value Ref Range   Iron 68 45 - 182 ug/dL   TIBC 578 749 - 549 ug/dL   Saturation Ratios 16 (L) 17.9 - 39.5 %   UIBC 353 ug/dL    Comment: Performed at [facility], [address] [place], [place] [address]  CMP (Cancer Center only)     Status: Abnormal   Collection Time: 05/28/24  3:25 PM  Result Value Ref Range   Sodium 136 135 - 145 mmol/L   Potassium 3.5 3.5 - 5.1 mmol/L   Chloride 98 98 - 111 mmol/L   CO2 27 22 - 32 mmol/L   Glucose, Bld 102 (H) 70 - 99 mg/dL    Comment: Glucose reference range applies only to samples taken after fasting for at least 8 hours.   BUN 16 8 - 23 mg/dL   Creatinine 9.20 9.38 - 1.24 mg/dL   Calcium  9.6 8.9 - 10.3 mg/dL   Total Protein 7.6 6.5 - 8.1 g/dL   Albumin 4.1 3.5 - 5.0 g/dL   AST 18 15 - 41 U/L   ALT 20 0 - 44 U/L    Alkaline Phosphatase 111 38 - 126 U/L   Total Bilirubin 0.5 0.0 - 1.2 mg/dL   GFR, Estimated >39 >39 mL/min    Comment: (NOTE) Calculated using the CKD-EPI Creatinine Equation (2021)    Anion gap 11 5 - 15    Comment: Performed at [facility], [address] [place], [place] [address]  CBC with Differential (Cancer Center Only)     Status: Abnormal   Collection Time: 05/28/24  3:25 PM  Result Value Ref Range   WBC Count  12.5 (H) 4.0 - 10.5 K/uL   RBC 4.40 4.22 - 5.81 MIL/uL   Hemoglobin 13.8 13.0 - 17.0 g/dL   HCT 59.4 60.9 - 47.9 %   MCV 92.0 80.0 - 100.0 fL   MCH 31.4 26.0 - 34.0 pg   MCHC 34.1 30.0 - 36.0 g/dL   RDW 84.5 88.4 - 84.4 %   Platelet Count 376 150 - 400 K/uL   nRBC 0.2 0.0 - 0.2 %   Neutrophils Relative % 67 %   Neutro Abs 8.4 (H) 1.7 - 7.7 K/uL   Lymphocytes Relative 20 %   Lymphs Abs 2.5 0.7 - 4.0 K/uL   Monocytes Relative 10 %   Monocytes Absolute 1.2 (H) 0.1 - 1.0 K/uL   Eosinophils Relative 2 %   Eosinophils Absolute 0.3 0.0 - 0.5 K/uL   Basophils Relative 0 %   Basophils Absolute 0.0 0.0 - 0.1 K/uL   Immature Granulocytes 1 %   Abs Immature Granulocytes 0.07 0.00 - 0.07 K/uL    Comment: Performed at [facility], [address] [place], [place] [address]  CALR +MPL + E12-E15 (reflexed)     Status: None   Collection Time: 05/28/24  3:25 PM  Result Value Ref Range   CALR Result Comment     Comment: (NOTE) NEGATIVE No insertions or deletions were detected within the analyzed region of the calreticulin (CALR) gene. A negative result does not entirely exclude the possibility of a clonal population carrying CALR gene mutations that are not covered by this assay. Results should be interpreted in conjunction with clinical and laboratory findings for the most accurate interpretation.    MPL Result Comment     Comment: (NOTE) NEGATIVE No MPL mutation was identified in the provided specimen of this individual.  Results should be interpreted in conjunction with clinical and other laboratory findings for the most accurate interpretation.    E12-15 Result Comment     Comment: (NOTE) NEGATIVE    JAK2 mutations were not detected in exons 12, 13, 14 and 15. The G to T nucleotide change encoding the [id number] mutation was not detected. This result does not rule out the presence of JAK2 mutation at a level below the detection sensitivity of this assay, the presence of other mutations outside the analyzed region of the JAK2 gene, or the presence of a myeloproliferative or other neoplasm. Result must be correlated with other clinical data for the most accurate diagnosis. Performed At: [facility] [address] [place], [place] [id number] Loran Gales MDPhD Ey:[phone]      RADIOGRAPHIC STUDIES:  No recent pertinent imaging studies available to review.  Orders Placed This Encounter  Procedures   CBC with Differential (Cancer Center Only)    Standing Status:   Future    Expected Date:   10/08/2024    Expiration Date:   01/06/2025   C-reactive protein    Standing Status:   Future    Expected Date:   10/08/2024    Expiration Date:   01/06/2025   Sedimentation rate    Standing Status:   Future    Expected Date:   10/08/2024    Expiration Date:   01/06/2025     Future Appointments  Date Time Provider Department Center  07/02/2024  1:00 PM Kennyth Worth HERO, MD [facility]  10/08/2024  2:00 PM [facility] PHLEBOTOMIST [facility] None  10/08/2024  2:30 PM Takiesha Mcdevitt, Chinita, MD [facility] None    This document was completed utilizing  speech recognition software. Grammatical errors, random word insertions, pronoun errors, and incomplete sentences are an occasional consequence of this system due to software limitations, ambient noise, and hardware issues. Any formal questions or concerns about the content, text or information contained within the body of this dictation should be directly addressed to the  provider for clarification.

## 2024-07-02 ENCOUNTER — Ambulatory Visit (INDEPENDENT_AMBULATORY_CARE_PROVIDER_SITE_OTHER): Payer: Medicare Other | Admitting: Family Medicine

## 2024-07-02 ENCOUNTER — Encounter: Payer: Self-pay | Admitting: Family Medicine

## 2024-07-02 VITALS — BP 105/66 | HR 66 | Temp 98.1°F | Ht 72.0 in | Wt 222.8 lb

## 2024-07-02 DIAGNOSIS — E1165 Type 2 diabetes mellitus with hyperglycemia: Secondary | ICD-10-CM

## 2024-07-02 DIAGNOSIS — E1159 Type 2 diabetes mellitus with other circulatory complications: Secondary | ICD-10-CM | POA: Diagnosis not present

## 2024-07-02 DIAGNOSIS — G6289 Other specified polyneuropathies: Secondary | ICD-10-CM

## 2024-07-02 DIAGNOSIS — Z7985 Long-term (current) use of injectable non-insulin antidiabetic drugs: Secondary | ICD-10-CM | POA: Diagnosis not present

## 2024-07-02 DIAGNOSIS — I152 Hypertension secondary to endocrine disorders: Secondary | ICD-10-CM

## 2024-07-02 LAB — MICROALBUMIN / CREATININE URINE RATIO
Creatinine,U: 174.1 mg/dL
Microalb Creat Ratio: 12.3 mg/g (ref 0.0–30.0)
Microalb, Ur: 2.1 mg/dL — ABNORMAL HIGH (ref 0.0–1.9)

## 2024-07-02 MED ORDER — KETOCONAZOLE 2 % EX CREA
1.0000 | TOPICAL_CREAM | Freq: Two times a day (BID) | CUTANEOUS | 0 refills | Status: DC
Start: 2024-07-02 — End: 2024-08-31

## 2024-07-02 NOTE — Assessment & Plan Note (Signed)
 Check urine microalbumin today.  He has on metformin  500 mg daily and Mounjaro  7.5 mg weekly.  His A1cs have been well-controlled-do not think that this is contributing to his neuropathy.

## 2024-07-02 NOTE — Progress Notes (Signed)
   Roy Avila is a 73 y.o. male who presents today for an office visit.  Assessment/Plan:  New/Acute Problems: Rash No red flags.  Has not had much response to systemic and topical steroids.  Will try topical ketoconazole .  If not improving will need to follow back up with dermatology.  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  His labs a few months ago did show low folate and low normal B12 however symptoms have persisted despite replacement.  Most recent labs showed normal B12 and folate.  Given progression of symptoms we will place referral to neurology for further evaluation.  Type 2 diabetes mellitus with hyperglycemia, without long-term current use of insulin  (HCC) Check urine microalbumin today.  He has on metformin  500 mg daily and Mounjaro  7.5 mg weekly.  His A1cs have been well-controlled-do not think that this is contributing to his neuropathy.  Hypertension associated with diabetes (HCC) At goal today on amlodipine  5 mg daily and olmesartan  HCTZ 40-25 once daily.     Subjective:  HPI:  Patient here today for follow up.  I last saw him about 3 months ago. His main concern at that point was peripheral neuropathy. We checked labs at that time which showed low B12 and folate. Also found to have slight anemia. He was referred to hematology for this as well and has seen them twice.  Per last note his leukocytosis is likely reactionary to clinic information and they are not concerned about his slight normocytic anemia.  They are continue to monitor for now.  Patient reports that his neuropathy has continued to progress since our last visit.  Still getting persistent numbness and tingling in his legs.  Sometimes feels unsteady due to lack of sensation in his feet.  Does not have any back pain.  No report of bowel or bladder incontinence.  Also has a rash on his left thigh.  This has been present for several weeks.  Recently had  contact dermatitis and was treated with a course of prednisone  however this has persisted since then.  He has also tried several topical steroid ointments without much improvement.       Objective:  Physical Exam: BP 105/66   Pulse 66   Temp 98.1 F (36.7 C) (Temporal)   Ht 6' (1.829 m)   Wt 222 lb 12.8 oz (101.1 kg)   SpO2 96%   BMI 30.22 kg/m   Gen: No acute distress, resting comfortably CV: Regular rate and rhythm with no murmurs appreciated Pulm: Normal work of breathing, clear to auscultation bilaterally with no crackles, wheezes, or rhonchi Skin: Left lower thigh with erythematous patches. Neuro: Grossly normal, moves all extremities Psych: Normal affect and thought content      Oceanna Arruda M. Kennyth, MD 07/02/2024 1:27 PM

## 2024-07-02 NOTE — Assessment & Plan Note (Signed)
 At goal today on amlodipine  5 mg daily and olmesartan  HCTZ 40-25 once daily.

## 2024-07-02 NOTE — Assessment & Plan Note (Signed)
 Symptoms have continued to worsen.  His labs a few months ago did show low folate and low normal B12 however symptoms have persisted despite replacement.  Most recent labs showed normal B12 and folate.  Given progression of symptoms we will place referral to neurology for further evaluation.

## 2024-07-02 NOTE — Patient Instructions (Addendum)
 It was very nice to see you today!  I will refer you to neurology for your neuropathy.  Try the ketoconazole  cream for your rash.  Let us  know if not improving.  Return if symptoms worsen or fail to improve.   Take care, Dr Kennyth  PLEASE NOTE:  If you had any lab tests, please let us  know if you have not heard back within a few days. You may see your results on mychart before we have a chance to review them but we will give you a call once they are reviewed by us .   If we ordered any referrals today, please let us  know if you have not heard from their office within the next week.   If you had any urgent prescriptions sent in today, please check with the pharmacy within an hour of our visit to make sure the prescription was transmitted appropriately.   Please try these tips to maintain a healthy lifestyle:  Eat at least 3 REAL meals and 1-2 snacks per day.  Aim for no more than 5 hours between eating.  If you eat breakfast, please do so within one hour of getting up.   Each meal should contain half fruits/vegetables, one quarter protein, and one quarter carbs (no bigger than a computer mouse)  Cut down on sweet beverages. This includes juice, soda, and sweet tea.   Drink at least 1 glass of water with each meal and aim for at least 8 glasses per day  Exercise at least 150 minutes every week.

## 2024-07-03 ENCOUNTER — Ambulatory Visit: Payer: Self-pay | Admitting: Family Medicine

## 2024-07-03 ENCOUNTER — Encounter: Payer: Self-pay | Admitting: Neurology

## 2024-07-03 NOTE — Progress Notes (Signed)
 Urine sample is normal. No signs of increased protein in urine.

## 2024-07-16 ENCOUNTER — Other Ambulatory Visit: Payer: Self-pay | Admitting: Family Medicine

## 2024-07-16 DIAGNOSIS — I10 Essential (primary) hypertension: Secondary | ICD-10-CM

## 2024-07-26 NOTE — Progress Notes (Signed)
 Initial neurology clinic note  Reason for Evaluation: Consultation requested by Kennyth Worth HERO, MD for an opinion regarding burning in his feet. My final recommendations will be communicated back to the requesting physician by way of shared medical record or letter to requesting physician via [place]  mail.  HPI: This is Mr. Roy Avila, a 73 y.o. right-handed male with a medical history of DM2, HTN, HLD, folate deficiency, B12 deficiency, GERD, OSA on CPAP who presents to neurology clinic with the chief complaint of burning in his feet. The patient is alone today.  Patient first noticed mild intermittent symptoms about 1 year ago with burning in the feet. It was infrequent at first. It has progressed and is daily now. He has tingling in feet and legs. He has symptoms on both sides, maybe a little more on the left side than the right. He is not very active over the last year and thinks he is weak from lack of activity. He has noticed he is off balance when walking. He has not fallen. He originally went to see chiropractor but did not pursue treatment due to high cost. He has had some episodes of back pain in the past, but nothing currently. He denies cramps or twitching.  He denies any symptoms in his hands. He has never been on medication for his symptoms.  He does report any constitutional symptoms like fever, night sweats, anorexia or unintentional weight loss.  EtOH use: Twice per week will drink (4 glasses of vodka) social Restrictive diet? No Supplements? No, used to be on B12 and folate, but not currently as levels are normal now Family history of neuropathy/myopathy/neurologic disease? no   MEDICATIONS:  Outpatient Encounter Medications as of 08/03/2024  Medication Sig Note   nitroGLYCERIN  (NITROSTAT ) 0.4 MG SL tablet Place 1 tablet (0.4 mg total) under the tongue every 5 (five) minutes as needed for chest pain.    olmesartan -hydrochlorothiazide (BENICAR  HCT) 40-25 MG tablet TAKE 1  TABLET DAILY    tirzepatide  (MOUNJARO ) 7.5 MG/0.5ML Pen Inject 7.5 mg into the skin once a week.    ALPRAZolam  (XANAX ) 0.5 MG tablet Take 1 tablet (0.5 mg total) by mouth 2 (two) times daily as needed for anxiety.    amLODipine  (NORVASC ) 5 MG tablet TAKE 1 TABLET DAILY    aspirin  EC 81 MG tablet Take 1 tablet (81 mg total) by mouth daily. Swallow whole.    atorvastatin  (LIPITOR) 40 MG tablet TAKE 1 TABLET DAILY    Clobetasol Propionate 0.05 % shampoo Apply 1 Application topically every other day. 03/06/2024: PER PT USES PRN   cyanocobalamin  (VITAMIN B12) 1000 MCG/ML injection Inject 1ml next week then once per month for 3 month    doxycycline  (VIBRAMYCIN ) 100 MG capsule Take 100 mg by mouth 2 (two) times daily. 03/06/2024: PER PT TAKES PRN   esomeprazole  (NEXIUM ) 40 MG capsule TAKE 1 CAPSULE DAILY    ketoconazole  (NIZORAL ) 2 % cream Apply 1 Application topically 2 (two) times daily.    metFORMIN  (GLUCOPHAGE -XR) 500 MG 24 hr tablet TAKE 1 TABLET AT BEDTIME    metoprolol  succinate (TOPROL  XL) 25 MG 24 hr tablet Take 1 tablet (25 mg total) by mouth daily.    No facility-administered encounter medications on file as of 08/03/2024.    PAST MEDICAL HISTORY: Past Medical History:  Diagnosis Date   Abnormal LFTs (liver function tests), monitoring, likely due to fatty liver, may need US  01/27/2017   Anxiety    Back pain  Chronic otitis media after insertion of tympanic ventilation tube, right 07/13/2018   Depression    GERD (gastroesophageal reflux disease)    High blood sugar    Hyperlipidemia    Hypertension    Obstructive sleep apnea syndrome 01/22/2017   Prediabetes    Sleep apnea    Swallowing difficulty     PAST SURGICAL HISTORY: Past Surgical History:  Procedure Laterality Date   HERNIA REPAIR     LEFT HEART CATH AND CORONARY ANGIOGRAPHY N/A 08/24/2023   Procedure: LEFT HEART CATH AND CORONARY ANGIOGRAPHY;  Surgeon: [place], Peter M, MD;  Location: [facility] INVASIVE CV LAB;  Service:  Cardiovascular;  Laterality: N/A;    ALLERGIES: Allergies  Allergen Reactions   Clarithromycin Rash    FAMILY HISTORY: Family History  Problem Relation Age of Onset   Breast cancer Mother    Hypertension Mother    Obesity Mother    Heart disease Father    Hypertension Father    Hyperlipidemia Father    Sudden death Father    Prostate cancer Neg Hx    Colon cancer Neg Hx     SOCIAL HISTORY: Social History   Tobacco Use   Smoking status: Former    Types: Cigarettes   Smokeless tobacco: Never   Tobacco comments:    Social smoker, 1 pack per week  Vaping Use   Vaping status: Never Used  Substance Use Topics   Alcohol use: Yes    Comment: social drink   Drug use: No   Social History   Social History Narrative   Are you right handed or left handed? Right   Are you currently employed ?    What is your current occupation? retired   Do you live at home alone?   Who lives with you? wife   What type of home do you live in: 1 story or 2 story? Two     Caffeine none     OBJECTIVE: PHYSICAL EXAM: BP 132/73   Pulse 68   Ht 6' (1.829 m)   Wt 226 lb (102.5 kg)   SpO2 96%   BMI 30.65 kg/m   General: General appearance: Awake and alert. No distress. Cooperative with exam.  Skin: No obvious rash or jaundice. HEENT: Atraumatic. Anicteric. Lungs: Non-labored breathing on room air  Heart: Regular Extremities: No edema. Psych: Affect appropriate.  Neurological: Mental Status: Alert. Speech fluent. No pseudobulbar affect Cranial Nerves: CNII: No RAPD. Visual fields grossly intact. CNIII, IV, VI: PERRL. No nystagmus. EOMI. CN V: Facial sensation intact bilaterally to fine touch. CN VII: Facial muscles symmetric and strong. No ptosis at rest. CN VIII: Hearing grossly intact bilaterally. CN IX: No hypophonia. CN X: Palate elevates symmetrically. CN XI: Full strength shoulder shrug bilaterally. CN XII: Tongue protrusion full and midline. No atrophy or  fasciculations. No significant dysarthria Motor: Tone is normal. Strength is 5/5 in bilateral upper and lower extremities Reflexes:  Right Left   Bicep 2+ 2+   Tricep 2+ 2+   BrRad 2+ 2+   Knee 2+ 2+   Ankle 2+ 2+    Pathological Reflexes: Babinski: flexor response bilaterally Hoffman: absent bilaterally Troemner: absent bilaterally Sensation: Pinprick: Diminished in bilateral lower extremities in length dependent fashion to mid calves Vibration: Absent in bilateral great toes, diminished in bilateral ankles, otherwise intact Proprioception: Intact in bilateral great toes Coordination: Intact finger-to- nose-finger bilaterally. Romberg with mild sway. Gait: Able to rise from chair with arms crossed unassisted. Mildly wide based gait.  Lab and Test Review: Internal labs: 05/28/24: CBC significant for WBC 12.5 (chronic), MCV 92.0 CMP unremarkable Ferritin 126 B12: 686 MMA wnl CRP wnl ESR 42 Folate wnl  04/26/24: Folate low at 5.9 B12: 299 TSH wnl  HbA1c (01/03/24): 5.6 (as high as 6.8 on 07/30/19)  ASSESSMENT: Roy Avila is a 73 y.o. male who presents for evaluation of burning, numbness, and tingling in feet and lower legs. He has a relevant medical history of DM2, HTN, HLD, folate deficiency, B12 deficiency, GERD, OSA on CPAP. His neurological examination is pertinent for length dependent sensory loss in bilateral lower limbs. Available diagnostic data is significant for low B12 and folate in 03/2024 that was then normal after supplementation in 04/2024, HbA1c 5.6. 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 His known risk factors include diabetes, EtOH use, and B12 and folate deficiency.  PLAN: -Blood work: B1, IFE -Discussed neuropathy risk factors and importance of treating DM, cutting back on EtOH, and vitamin deficiencies -Alpha lipoic acid 600 mg daily -Lidocaine  cream PRN -Start gabapentin  300 mg at bedtime  -Return to clinic  in 6 months  The impression above as well as the plan as outlined below were extensively discussed with the patient who voiced understanding. All questions were answered to their satisfaction.  The patient was counseled on pertinent fall precautions per the printed material provided today, and as noted under the Patient Instructions section below.  When available, results of the above investigations and possible further recommendations will be communicated to the patient via telephone/MyChart. Patient to call office if not contacted after expected testing turnaround time.   Total time spent reviewing records, interview, history/exam, documentation, and coordination of care on day of encounter:  50 min   Thank you for allowing me to participate in patient's care.  If I can answer any additional questions, I would be pleased to do so.  Venetia Potters, MD   CC: Kennyth Worth HERO, MD [address] [place] [address]  CC: Referring provider: Kennyth Worth HERO, MD [address] [place],  [place] [address]

## 2024-08-03 ENCOUNTER — Ambulatory Visit: Admitting: Neurology

## 2024-08-03 ENCOUNTER — Encounter: Payer: Self-pay | Admitting: Neurology

## 2024-08-03 ENCOUNTER — Other Ambulatory Visit

## 2024-08-03 VITALS — BP 132/73 | HR 68 | Ht 72.0 in | Wt 226.0 lb

## 2024-08-03 DIAGNOSIS — R2689 Other abnormalities of gait and mobility: Secondary | ICD-10-CM

## 2024-08-03 DIAGNOSIS — G629 Polyneuropathy, unspecified: Secondary | ICD-10-CM | POA: Diagnosis not present

## 2024-08-03 MED ORDER — GABAPENTIN 300 MG PO CAPS: 300.0000 mg | ORAL_CAPSULE | Freq: Every day | ORAL | 3 refills | Status: DC

## 2024-08-03 NOTE — Patient Instructions (Signed)
 I saw you today for burning, numbness, and tingling in your feet and legs. Your symptoms are consistent with neuropathy. Your known risk factors include diabetes, alcohol use, and vitamin deficiencies (folate and B12).  I would like to do more lab work today to make sure there are no other common causes we are missing and need to treat. I will be in touch when I have the results.  I am prescribing gabapentin  300 mg at bedtime for burning and tingling. This is a very low dose that has a lot of room to increase should we need to in the future.  Alpha lipoic acid 600mg  daily has some research data suggesting it helps with nerve health. No major side effects other than <1% of people report upset stomach. This can be taken twice per day (1200mg  daily) if no relief obtained. You can buy this over the counter or online.  You can also try Lidocaine  cream as needed. Apply wear you have pain, tingling, or burning. Wear gloves to prevent your hands being numb. This can be bought over the counter at any drug store or online.  I will see you back in clinic in 6 months. Please let me know if you have any questions or concerns in the meantime.   The physicians and staff at [facility] are committed to providing excellent care. You may receive a survey requesting feedback about your experience at our office. We strive to receive very good responses to the survey questions. If you feel that your experience would prevent you from giving the office a very good  response, please contact our office to try to remedy the situation. We may be reached at [phone]. Thank you for taking the time out of your busy day to complete the survey.  Venetia Potters, MD [facility]

## 2024-08-08 ENCOUNTER — Other Ambulatory Visit: Payer: Self-pay | Admitting: Family Medicine

## 2024-08-08 LAB — IMMUNOFIXATION ELECTROPHORESIS
IgG (Immunoglobin G), Serum: 1326 mg/dL (ref 600–1540)
IgM, Serum: 107 mg/dL (ref 50–300)
Immunoglobulin A: 376 mg/dL — ABNORMAL HIGH (ref 70–320)

## 2024-08-08 LAB — VITAMIN B1: Vitamin B1 (Thiamine): 19 nmol/L (ref 8–30)

## 2024-08-12 ENCOUNTER — Ambulatory Visit: Payer: Self-pay | Admitting: Neurology

## 2024-08-27 ENCOUNTER — Telehealth: Payer: Self-pay | Admitting: Family Medicine

## 2024-08-27 NOTE — Telephone Encounter (Unsigned)
 Copied from CRM #[id number]. Topic: Clinical - Medication Refill >> Aug 27, 2024  9:35 AM [place] A wrote: Medication: tirzepatide  (MOUNJARO ) 7.5 MG/0.5ML Pen  Has the patient contacted their pharmacy? No (Agent: If no, request that the patient contact the pharmacy for the refill. If patient does not wish to contact the pharmacy document the reason why and proceed with request.) (Agent: If yes, when and what did the pharmacy advise?)  This is the patient's preferred pharmacy:  [id number] - [place], [place] - [address] [place] [address] Phone: [phone] Fax: [phone]   Is this the correct pharmacy for this prescription? Yes If no, delete pharmacy and type the correct one.   Has the prescription been filled recently? No  Is the patient out of the medication? No  Has the patient been seen for an appointment in the last year OR does the patient have an upcoming appointment? Yes  Can we respond through MyChart? Yes  Agent: Please be advised that Rx refills may take up to 3 business days. We ask that you follow-up with your pharmacy.

## 2024-08-31 ENCOUNTER — Telehealth: Payer: Self-pay | Admitting: Family Medicine

## 2024-08-31 ENCOUNTER — Other Ambulatory Visit: Payer: Self-pay | Admitting: Family Medicine

## 2024-08-31 DIAGNOSIS — R21 Rash and other nonspecific skin eruption: Secondary | ICD-10-CM

## 2024-08-31 NOTE — Telephone Encounter (Signed)
 Pt is asking about his refill for his tirzepatide  (MOUNJARO ) 7.5 MG/0.5ML Pen [[id number]] he is taking his last injection.

## 2024-09-03 ENCOUNTER — Other Ambulatory Visit: Payer: Self-pay | Admitting: *Deleted

## 2024-09-03 MED ORDER — TIRZEPATIDE 7.5 MG/0.5ML ~~LOC~~ SOAJ: 7.5000 mg | SUBCUTANEOUS | 3 refills | Status: AC

## 2024-09-03 NOTE — Telephone Encounter (Signed)
 Rx send to [facility]

## 2024-10-03 ENCOUNTER — Ambulatory Visit: Admitting: Neurology

## 2024-10-08 ENCOUNTER — Inpatient Hospital Stay: Admitting: Oncology

## 2024-10-08 ENCOUNTER — Encounter: Payer: Self-pay | Admitting: Oncology

## 2024-10-08 ENCOUNTER — Inpatient Hospital Stay: Attending: Oncology

## 2024-10-08 VITALS — BP 130/60 | HR 64 | Temp 98.0°F | Resp 18 | Ht 72.0 in | Wt 229.0 lb

## 2024-10-08 DIAGNOSIS — E1142 Type 2 diabetes mellitus with diabetic polyneuropathy: Secondary | ICD-10-CM | POA: Insufficient documentation

## 2024-10-08 DIAGNOSIS — Z79899 Other long term (current) drug therapy: Secondary | ICD-10-CM | POA: Insufficient documentation

## 2024-10-08 DIAGNOSIS — D649 Anemia, unspecified: Secondary | ICD-10-CM | POA: Diagnosis not present

## 2024-10-08 DIAGNOSIS — Z87891 Personal history of nicotine dependence: Secondary | ICD-10-CM | POA: Insufficient documentation

## 2024-10-08 DIAGNOSIS — D72829 Elevated white blood cell count, unspecified: Secondary | ICD-10-CM | POA: Insufficient documentation

## 2024-10-08 DIAGNOSIS — I10 Essential (primary) hypertension: Secondary | ICD-10-CM | POA: Insufficient documentation

## 2024-10-08 DIAGNOSIS — L719 Rosacea, unspecified: Secondary | ICD-10-CM | POA: Insufficient documentation

## 2024-10-08 LAB — CBC WITH DIFFERENTIAL (CANCER CENTER ONLY)
Abs Immature Granulocytes: 0.03 K/uL (ref 0.00–0.07)
Basophils Absolute: 0.1 K/uL (ref 0.0–0.1)
Basophils Relative: 1 %
Eosinophils Absolute: 1.1 K/uL — ABNORMAL HIGH (ref 0.0–0.5)
Eosinophils Relative: 12 %
HCT: 38.7 % — ABNORMAL LOW (ref 39.0–52.0)
Hemoglobin: 13.2 g/dL (ref 13.0–17.0)
Immature Granulocytes: 0 %
Lymphocytes Relative: 28 %
Lymphs Abs: 2.5 K/uL (ref 0.7–4.0)
MCH: 31.7 pg (ref 26.0–34.0)
MCHC: 34.1 g/dL (ref 30.0–36.0)
MCV: 93 fL (ref 80.0–100.0)
Monocytes Absolute: 0.7 K/uL (ref 0.1–1.0)
Monocytes Relative: 8 %
Neutro Abs: 4.8 K/uL (ref 1.7–7.7)
Neutrophils Relative %: 51 %
Platelet Count: 370 K/uL (ref 150–400)
RBC: 4.16 MIL/uL — ABNORMAL LOW (ref 4.22–5.81)
RDW: 14.3 % (ref 11.5–15.5)
WBC Count: 9.2 K/uL (ref 4.0–10.5)
nRBC: 0 % (ref 0.0–0.2)

## 2024-10-08 LAB — C-REACTIVE PROTEIN: CRP: 0.9 mg/dL (ref <1.0)

## 2024-10-08 LAB — SEDIMENTATION RATE: Sed Rate: 41 mm/h — ABNORMAL HIGH (ref 0–16)

## 2024-10-08 NOTE — Assessment & Plan Note (Signed)
 Previously mild anemia with hemoglobin slightly below normal range (12.8 g/dL). No previous history of anemia. Not a major concern at this time.  Repeat hemoglobin on 05/28/24 was actually normal at 13.8, MCV normal at 92.  Ferritin normal.  B12, folic acid  were all normal.  Hemoglobin normal again today at 13.2

## 2024-10-08 NOTE — Progress Notes (Signed)
 [facility]  HEMATOLOGY CLINIC PROGRESS NOTE  PATIENT NAME: Roy Avila   MR#: [id number] DOB: 1951-05-05  Patient Care Team: Kennyth Worth HERO, MD as PCP - General (Family Medicine) Rollin Dover, MD as Consulting Physician (Gastroenterology)  Date of visit: 10/08/2024   ASSESSMENT & PLAN:   Roy Avila is a 73 y.o. gentleman with a past medical history of hypertension, diabetes mellitus type 2, peripheral neuropathy, 2V CAD, rosacea, was referred to our service in June 2025 for evaluation of mild leukocytosis, thrombocytosis and mild anemia.  Grossly negative hematological workup.  Chronic, mild, intermittent leukocytosis is felt to be reactionary.  Leukocytosis Chronic leukocytosis with WBC count elevated since 2018, peaking at 14,000 in September 2024.  Recent count was 11,700. Differential includes chronic inflammation, bone marrow dysfunction, or low likelihood of leukemia due to long duration without significant change.  On his consultation with us  on 05/28/2024, labs revealed white count of 12,500 with ANC of 8400, normal differential.  Hemoglobin normal at 13.8, platelet count normal at 376,000.  CMP unremarkable.  Vitamin B12, iron studies, folate, LDH, ferritin, haptoglobin were all normal.  CRP was normal at 0.6.  Sed rate was increased at 42 mm/h.  BCR/ABL 1 was negative.  JAK2 V617F mutation analysis, followed by reflex testing to include CALR, MPL, exon 12-15 mutations were all negative.  No evidence of primary myeloproliferative neoplasm.  Labs today actually showed normal white count of 9200 with normal differential.  Hemoglobin normal at 13.2, platelet count normal at 370,000.  Mild chronic, intermittent leukocytosis is likely reactionary to chronic inflammation.   No additional hematological workup or intervention is planned at this time, as long as white count remains below 15,000.  He was advised to continue to follow-up with his PCP for continued  lab monitoring.  Please reconsult us  as necessary.  Thank you for giving us  an opportunity to participate in his care.  Normocytic anemia Previously mild anemia with hemoglobin slightly below normal range (12.8 g/dL). No previous history of anemia. Not a major concern at this time.  Repeat hemoglobin on 05/28/24 was actually normal at 13.8, MCV normal at 92.  Ferritin normal.  B12, folic acid  were all normal.  Hemoglobin normal again today at 13.2   Type 2 diabetes mellitus Well-controlled with improved blood glucose levels.  Rosacea Managed with intermittent doxycycline  use. He is currently running low on medication and plans to refill it. - Continue doxycycline  as needed for rosacea management.  I spent a total of 20 minutes during this encounter with the patient including review of chart and various tests results, discussions about plan of care and coordination of care plan.  I reviewed lab results and outside records for this visit and discussed relevant results with the patient. Diagnosis, plan of care and treatment options were also discussed in detail with the patient. Opportunity provided to ask questions and answers provided to his apparent satisfaction. Provided instructions to call our clinic with any problems, questions or concerns prior to return visit. I recommended to continue follow-up with PCP and sub-specialists. He verbalized understanding and agreed with the plan. No barriers to learning was detected.  Chinita Patten, MD  10/08/2024 5:00 PM  [facility] - A DEPT OF [facility] [address] [place] [address] Dept: [phone] Dept Fax: [phone]   CHIEF COMPLAINT/ REASON FOR VISIT:  Follow-up for chronic, mild leukocytosis.  Also has intermittent thrombocytosis and  mild anemia.  Grossly negative hematological workup.  INTERVAL HISTORY:  Discussed the use of AI scribe software for  clinical note transcription with the patient, who gave verbal consent to proceed.  History of Present Illness Roy Avila is a 73 year old male who presents for follow-up of blood work results.  His white blood cell count has been slightly elevated since 2018, fluctuating between 11,000 and 14,000, but is currently at 9,200. Platelet counts have also been slightly elevated in the past but are currently normal. Previous workups, including checks for marrow mutations, have been normal. An inflammation marker, ESR, was elevated at 42 during his last visit, but today's result is pending.  He recalls a previous episode of poison ivy reaction for which he was treated with prednisone . This condition has since resolved.  He takes doxycycline  intermittently for rosacea and is planning to pick up a new prescription today as he has run out.  He has diabetes, but his numbers are currently down and stable.  He experiences neuropathy, which developed this year and is bothersome. He is currently on gabapentin  as prescribed by his neurologist, with a follow-up scheduled in six months.  During the review of symptoms, he notes generalized aches and pains, which he attributes to aging, but does not report any specific debilitating joint issues.    SUMMARY OF HEMATOLOGIC HISTORY:  He was referred by Dr. Kennyth for evaluation of mild anemia and elevated white blood cell and platelet counts.   On 04/26/2024, labs at his PCPs office showed white count of 11,700, platelet count of 421,000, hemoglobin 12.8, MCV 91.  He was referred to us  for further evaluation of these lab abnormalities.  On review of records, he has had chronic, mild leukocytosis likely since 2018 with white count ranging anywhere between 11,000-14,000.  Hemoglobin and platelet count were previously within normal limits.   His white blood cell count has been slightly elevated since at least 2018, with the highest recorded at 14,000 in September  2024. The most recent count in May 2025 was 11,700. His platelet count was 421,000 in May 2025, with a previous high of 445,000 in September 2024.   He experiences burning and numbness in his feet, which started this year and has progressively worsened. He describes his feet as feeling weak rather than off balance. This has impacted his ability to walk as much as he used to, and he notes that the burning sensation occurs more frequently now than it did initially.   No recurrent infections, fevers, chills, or night sweats. His appetite is good, and he has intentionally lost 40 pounds this year using Mounjaro . No unintentional weight loss.   He has a history of social smoking but has quit.  In May 2025, he was taking prednisone  for a severe poison ivy reaction.  He does take doxycycline  intermittently for rosacea. He has a history of heart issues, with a family history prompting a calcium  test that led to further cardiac evaluation, including catheterization, which revealed some blockages. However, he has no symptoms and was advised against surgery. He also had a recent gallbladder issue with pain that resolved after dietary changes.   Chronic leukocytosis with WBC count elevated since 2018, peaking at 14,000 in September 2024.  Recent count was 11,700. Differential includes chronic inflammation, bone marrow dysfunction, or low likelihood of leukemia due to long duration without significant change.   On his consultation with us  on 05/28/2024, labs revealed white count of 12,500 with ANC of  8400, normal differential.  Hemoglobin normal at 13.8, platelet count normal at 376,000.  CMP unremarkable.  Vitamin B12, iron studies, folate, LDH, ferritin, haptoglobin were all normal.  CRP was normal at 0.6.  Sed rate was increased at 42 mm/h.  BCR/ABL 1 was negative.  JAK2 V617F mutation analysis, followed by reflex testing to include CALR, MPL, exon 12-15 mutations were all negative.  No evidence of primary  myeloproliferative neoplasm.   Mild chronic, intermittent leukocytosis is likely reactionary to chronic inflammation.  Will continue monitoring.  I have reviewed the past medical history, past surgical history, social history and family history with the patient and they are unchanged from previous note.  ALLERGIES: He is allergic to clarithromycin.  MEDICATIONS:  Current Outpatient Medications  Medication Sig Dispense Refill   ALPRAZolam  (XANAX ) 0.5 MG tablet Take 1 tablet (0.5 mg total) by mouth 2 (two) times daily as needed for anxiety. 20 tablet 0   amLODipine  (NORVASC ) 5 MG tablet TAKE 1 TABLET DAILY 90 tablet 3   aspirin  EC 81 MG tablet Take 1 tablet (81 mg total) by mouth daily. Swallow whole.     atorvastatin  (LIPITOR) 40 MG tablet TAKE 1 TABLET DAILY 90 tablet 1   Clobetasol Propionate 0.05 % shampoo Apply 1 Application topically every other day.     doxycycline  (VIBRAMYCIN ) 100 MG capsule Take 100 mg by mouth 2 (two) times daily.     esomeprazole  (NEXIUM ) 40 MG capsule TAKE 1 CAPSULE DAILY 90 capsule 1   gabapentin  (NEURONTIN ) 300 MG capsule Take 1 capsule (300 mg total) by mouth at bedtime. 90 capsule 3   ketoconazole  (NIZORAL ) 2 % cream APPLY TO AFFECTED AREA TWICE A DAY 60 g 0   metFORMIN  (GLUCOPHAGE -XR) 500 MG 24 hr tablet TAKE 1 TABLET AT BEDTIME 90 tablet 1   metoprolol  succinate (TOPROL  XL) 25 MG 24 hr tablet Take 1 tablet (25 mg total) by mouth daily. 90 tablet 3   olmesartan -hydrochlorothiazide (BENICAR  HCT) 40-25 MG tablet TAKE 1 TABLET DAILY 90 tablet 1   tirzepatide  (MOUNJARO ) 7.5 MG/0.5ML Pen Inject 7.5 mg into the skin once a week. 6 mL 3   nitroGLYCERIN  (NITROSTAT ) 0.4 MG SL tablet Place 1 tablet (0.4 mg total) under the tongue every 5 (five) minutes as needed for chest pain. (Patient not taking: Reported on 10/08/2024) 90 tablet 3   No current facility-administered medications for this visit.     REVIEW OF SYSTEMS:    Review of Systems - Oncology  All other  pertinent systems were reviewed with the patient and are negative.  PHYSICAL EXAMINATION:    Onc Performance Status - 10/08/24 1444       ECOG Perf Status   ECOG Perf Status Restricted in physically strenuous activity but ambulatory and able to carry out work of a light or sedentary nature, e.g., light house work, office work      KPS SCALE   KPS % SCORE Able to carry on normal activity, minor s/s of disease          Vitals:   10/08/24 1429 10/08/24 1433  BP: (!) 145/68 130/60  Pulse: 64   Resp: 18   Temp: 98 F (36.7 C)   SpO2: 100%    Filed Weights   10/08/24 1429  Weight: 229 lb (103.9 kg)    Physical Exam Constitutional:      General: He is not in acute distress.    Appearance: Normal appearance.  HENT:     Head: Normocephalic and  atraumatic.  Eyes:     Conjunctiva/sclera: Conjunctivae normal.  Cardiovascular:     Rate and Rhythm: Normal rate and regular rhythm.  Pulmonary:     Effort: Pulmonary effort is normal. No respiratory distress.  Abdominal:     General: There is no distension.  Neurological:     General: No focal deficit present.     Mental Status: He is alert and oriented to person, place, and time.  Psychiatric:        Mood and Affect: Mood normal.        Behavior: Behavior normal.     LABORATORY DATA:   I have reviewed the data as listed.  Results for orders placed or performed in visit on 10/08/24  Sedimentation rate  Result Value Ref Range   Sed Rate 41 (H) 0 - 16 mm/hr  CBC with Differential (Cancer Center Only)  Result Value Ref Range   WBC Count 9.2 4.0 - 10.5 K/uL   RBC 4.16 (L) 4.22 - 5.81 MIL/uL   Hemoglobin 13.2 13.0 - 17.0 g/dL   HCT 61.2 (L) 60.9 - 47.9 %   MCV 93.0 80.0 - 100.0 fL   MCH 31.7 26.0 - 34.0 pg   MCHC 34.1 30.0 - 36.0 g/dL   RDW 85.6 88.4 - 84.4 %   Platelet Count 370 150 - 400 K/uL   nRBC 0.0 0.0 - 0.2 %   Neutrophils Relative % 51 %   Neutro Abs 4.8 1.7 - 7.7 K/uL   Lymphocytes Relative 28 %    Lymphs Abs 2.5 0.7 - 4.0 K/uL   Monocytes Relative 8 %   Monocytes Absolute 0.7 0.1 - 1.0 K/uL   Eosinophils Relative 12 %   Eosinophils Absolute 1.1 (H) 0.0 - 0.5 K/uL   Basophils Relative 1 %   Basophils Absolute 0.1 0.0 - 0.1 K/uL   Immature Granulocytes 0 %   Abs Immature Granulocytes 0.03 0.00 - 0.07 K/uL    RADIOGRAPHIC STUDIES:  No recent pertinent imaging studies available to review.  No orders of the defined types were placed in this encounter.    Future Appointments  Date Time Provider Department Center  01/31/2025  2:00 PM Leigh Venetia CROME, MD [id number] None    This document was completed utilizing speech recognition software. Grammatical errors, random word insertions, pronoun errors, and incomplete sentences are an occasional consequence of this system due to software limitations, ambient noise, and hardware issues. Any formal questions or concerns about the content, text or information contained within the body of this dictation should be directly addressed to the provider for clarification.

## 2024-10-08 NOTE — Assessment & Plan Note (Signed)
 Chronic leukocytosis with WBC count elevated since 2018, peaking at 14,000 in September 2024.  Recent count was 11,700. Differential includes chronic inflammation, bone marrow dysfunction, or low likelihood of leukemia due to long duration without significant change.  On his consultation with us  on 05/28/2024, labs revealed white count of 12,500 with ANC of 8400, normal differential.  Hemoglobin normal at 13.8, platelet count normal at 376,000.  CMP unremarkable.  Vitamin B12, iron studies, folate, LDH, ferritin, haptoglobin were all normal.  CRP was normal at 0.6.  Sed rate was increased at 42 mm/h.  BCR/ABL 1 was negative.  JAK2 V617F mutation analysis, followed by reflex testing to include CALR, MPL, exon 12-15 mutations were all negative.  No evidence of primary myeloproliferative neoplasm.  Labs today actually showed normal white count of 9200 with normal differential.  Hemoglobin normal at 13.2, platelet count normal at 370,000.  Mild chronic, intermittent leukocytosis is likely reactionary to chronic inflammation.   No additional hematological workup or intervention is planned at this time, as long as white count remains below 15,000.  He was advised to continue to follow-up with his PCP for continued lab monitoring.  Please reconsult us  as necessary.  Thank you for giving us  an opportunity to participate in his care.

## 2024-10-31 ENCOUNTER — Other Ambulatory Visit: Payer: Self-pay | Admitting: Cardiology

## 2024-12-11 ENCOUNTER — Other Ambulatory Visit: Payer: Self-pay | Admitting: Cardiology

## 2024-12-20 NOTE — Progress Notes (Unsigned)
 "  I saw AMAN BONET in neurology clinic on 08/03/24 in follow up for burning in his feet.  HPI: GIANMARCO ROYE is a 74 y.o. year old male with a history of DM2, HTN, HLD, folate deficiency, B12 deficiency, GERD, OSA on CPAP who we last saw on 08/03/24.  To briefly review: 08/03/24: Patient first noticed mild intermittent symptoms about 1 year ago with burning in the feet. It was infrequent at first. It has progressed and is daily now. He has tingling in feet and legs. He has symptoms on both sides, maybe a little more on the left side than the right. He is not very active over the last year and thinks he is weak from lack of activity. He has noticed he is off balance when walking. He has not fallen. He originally went to see chiropractor but did not pursue treatment due to high cost. He has had some episodes of back pain in the past, but nothing currently. He denies cramps or twitching.   He denies any symptoms in his hands. He has never been on medication for his symptoms.   He does report any constitutional symptoms like fever, night sweats, anorexia or unintentional weight loss.   EtOH use: Twice per week will drink (4 glasses of vodka) social Restrictive diet? No Supplements? No, used to be on B12 and folate, but not currently as levels are normal now Family history of neuropathy/myopathy/neurologic disease? no  Most recent Assessment and Plan (08/03/24): MIRKO TAILOR is a 74 y.o. male who presents for evaluation of burning, numbness, and tingling in feet and lower legs. He has a relevant medical history of DM2, HTN, HLD, folate deficiency, B12 deficiency, GERD, OSA on CPAP. His neurological examination is pertinent for length dependent sensory loss in bilateral lower limbs. Available diagnostic data is significant for low B12 and folate in 03/2024 that was then normal after supplementation in 04/2024, HbA1c 5.6. 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  His known risk factors include diabetes, EtOH use, and B12 and folate deficiency.   PLAN: -Blood work: B1, IFE -Discussed neuropathy risk factors and importance of treating DM, cutting back on EtOH, and vitamin deficiencies -Alpha lipoic acid 600 mg daily -Lidocaine  cream PRN -Start gabapentin  300 mg at bedtime  Since their last visit: Labs were normal.   Symptoms?***   MEDICATIONS:  Outpatient Encounter Medications as of 12/21/2024  Medication Sig Note   ALPRAZolam  (XANAX ) 0.5 MG tablet Take 1 tablet (0.5 mg total) by mouth 2 (two) times daily as needed for anxiety.    amLODipine  (NORVASC ) 5 MG tablet TAKE 1 TABLET DAILY    aspirin  EC 81 MG tablet Take 1 tablet (81 mg total) by mouth daily. Swallow whole.    atorvastatin  (LIPITOR) 40 MG tablet TAKE 1 TABLET DAILY    Clobetasol Propionate 0.05 % shampoo Apply 1 Application topically every other day. 03/06/2024: PER PT USES PRN   doxycycline  (VIBRAMYCIN ) 100 MG capsule Take 100 mg by mouth 2 (two) times daily. 03/06/2024: PER PT TAKES PRN   esomeprazole  (NEXIUM ) 40 MG capsule TAKE 1 CAPSULE DAILY    gabapentin  (NEURONTIN ) 300 MG capsule Take 1 capsule (300 mg total) by mouth at bedtime.    ketoconazole  (NIZORAL ) 2 % cream APPLY TO AFFECTED AREA TWICE A DAY    metFORMIN  (GLUCOPHAGE -XR) 500 MG 24 hr tablet TAKE 1 TABLET AT BEDTIME    metoprolol  succinate (TOPROL -XL) 25 MG 24 hr tablet TAKE 1 TABLET (25  MG TOTAL) BY MOUTH DAILY.    nitroGLYCERIN  (NITROSTAT ) 0.4 MG SL tablet Place 1 tablet (0.4 mg total) under the tongue every 5 (five) minutes as needed for chest pain. (Patient not taking: Reported on 10/08/2024)    olmesartan -hydrochlorothiazide (BENICAR  HCT) 40-25 MG tablet TAKE 1 TABLET DAILY    tirzepatide  (MOUNJARO ) 7.5 MG/0.5ML Pen Inject 7.5 mg into the skin once a week.    No facility-administered encounter medications on file as of 12/21/2024.    PAST MEDICAL HISTORY: Past Medical History:  Diagnosis Date   Abnormal LFTs (liver  function tests), monitoring, likely due to fatty liver, may need US  01/27/2017   Anxiety    Back pain    Chronic otitis media after insertion of tympanic ventilation tube, right 07/13/2018   Depression    GERD (gastroesophageal reflux disease)    High blood sugar    Hyperlipidemia    Hypertension    Obstructive sleep apnea syndrome 01/22/2017   Prediabetes    Sleep apnea    Swallowing difficulty     PAST SURGICAL HISTORY: Past Surgical History:  Procedure Laterality Date   HERNIA REPAIR     LEFT HEART CATH AND CORONARY ANGIOGRAPHY N/A 08/24/2023   Procedure: LEFT HEART CATH AND CORONARY ANGIOGRAPHY;  Surgeon: Jordan, Peter M, MD;  Location: [facility];  Service: Cardiovascular;  Laterality: N/A;    ALLERGIES: Allergies[1]  FAMILY HISTORY: Family History  Problem Relation Age of Onset   Breast cancer Mother    Hypertension Mother    Obesity Mother    Heart disease Father    Hypertension Father    Hyperlipidemia Father    Sudden death Father    Prostate cancer Neg Hx    Colon cancer Neg Hx     SOCIAL HISTORY: Social History[2] Social History   Social History Narrative   Are you right handed or left handed? Right   Are you currently employed ?    What is your current occupation? retired   Do you live at home alone?   Who lives with you? wife   What type of home do you live in: 1 story or 2 story? Two     Caffeine none    Objective:  Vital Signs:  There were no vitals taken for this visit.  General:*** General appearance: Awake and alert. No distress. Cooperative with exam.  Skin: No obvious rash or jaundice. HEENT: Atraumatic. Anicteric. Lungs: Non-labored breathing on room air  Heart: Regular Abdomen: Soft, non tender. Extremities: No edema. No obvious deformity.  Musculoskeletal: No obvious joint swelling.  Neurological: Mental Status: Alert. Speech fluent. No pseudobulbar affect Cranial Nerves: CNII: No RAPD. Visual fields intact. CNIII,  IV, VI: PERRL. No nystagmus. EOMI. CN V: Facial sensation intact bilaterally to fine touch. [place] clench strong. Jaw jerk***. CN VII: Facial muscles symmetric and strong. No ptosis at rest or after sustained upgaze***. CN VIII: Hears finger rub well bilaterally. CN IX: No hypophonia. CN X: Palate elevates symmetrically. CN XI: Full strength shoulder shrug bilaterally. CN XII: Tongue protrusion full and midline. No atrophy or fasciculations. No significant dysarthria*** Motor: Tone is ***. *** fasciculations in *** extremities. *** atrophy. No grip or percussive myotonia.  Individual muscle group testing (MRC grade out of 5):  Movement     Neck flexion ***    Neck extension ***     Right Left   Shoulder abduction *** ***   Shoulder adduction *** ***   Shoulder ext rotation *** ***  Shoulder int rotation *** ***   Elbow flexion *** ***   Elbow extension *** ***   Wrist extension *** ***   Wrist flexion *** ***   Finger abduction - FDI *** ***   Finger abduction - ADM *** ***   Finger extension *** ***   Finger distal flexion - 2/3 *** ***   Finger distal flexion - 4/5 *** ***   Thumb flexion - FPL *** ***   Thumb abduction - APB *** ***    Hip flexion *** ***   Hip extension *** ***   Hip adduction *** ***   Hip abduction *** ***   Knee extension *** ***   Knee flexion *** ***   Dorsiflexion *** ***   Plantarflexion *** ***   Inversion *** ***   Eversion *** ***   Great toe extension *** ***   Great toe flexion *** ***     Reflexes:  Right Left  Bicep *** ***  Tricep *** ***  BrRad *** ***  Knee *** ***  Ankle *** ***   Pathological Reflexes: Babinski: *** response bilaterally*** Hoffman: *** Troemner: *** Pectoral: *** Palmomental: *** Facial: *** Midline tap: *** Sensation: Pinprick: *** Vibration: *** Temperature: *** Proprioception: *** Coordination: Intact finger-to- nose-finger and heel-to-shin bilaterally. Romberg negative.*** Gait:  Able to rise from chair with arms crossed unassisted. Normal, narrow-based gait. Able to tandem walk. Able to walk on toes and heels.***   Lab and Test Review: New results: 10/08/24: CBC w/ diff unremarkable CRP wnl ESR 41  08/03/24: B1 wnl IFE: no M protein  Previously reviewed results: 05/28/24: CBC significant for WBC 12.5 (chronic), MCV 92.0 CMP unremarkable Ferritin 126 B12: 686 MMA wnl CRP wnl ESR 42 Folate wnl   04/26/24: Folate low at 5.9 B12: 299 TSH wnl   HbA1c (01/03/24): 5.6 (as high as 6.8 on 07/30/19)  ASSESSMENT: This is Reyes KANDICE Sharps, a 74 y.o. male with: ***   Plan: ***  Return to clinic in ***  Total time spent reviewing records, interview, history/exam, documentation, and coordination of care on day of encounter:  *** min  Venetia Potters, MD    [1]  Allergies Allergen Reactions   Clarithromycin Rash  [2]  Social History Tobacco Use   Smoking status: Former    Types: Cigarettes   Smokeless tobacco: Never   Tobacco comments:    Social smoker, 1 pack per week  Vaping Use   Vaping status: Never Used  Substance Use Topics   Alcohol use: Yes    Comment: social drink   Drug use: No   "

## 2024-12-21 ENCOUNTER — Encounter: Payer: Self-pay | Admitting: Neurology

## 2024-12-21 ENCOUNTER — Ambulatory Visit: Admitting: Neurology

## 2024-12-21 DIAGNOSIS — G629 Polyneuropathy, unspecified: Secondary | ICD-10-CM

## 2024-12-21 MED ORDER — GABAPENTIN 300 MG PO CAPS: 300.0000 mg | ORAL_CAPSULE | Freq: Two times a day (BID) | ORAL | 3 refills | Status: AC

## 2024-12-21 NOTE — Patient Instructions (Addendum)
 Alpha lipoic acid 600mg  daily has some research data suggesting it helps with nerve health. No major side effects other than <1% of people report upset stomach. This can be taken twice per day (1200mg  daily) if no relief obtained. You can buy this over the counter or online.   You can also try Lidocaine  cream as needed. Apply where you have pain, tingling, or burning. Wear gloves to prevent your hands being numb. This can be bought over the counter at any drug store or online.   I will increase your gabapentin  to 300 mg twice daily.  I will see you again in 6 months or sooner if needed.  Please let me know if you have any questions or concerns in the meantime.  The physicians and staff at [facility] are committed to providing excellent care. You may receive a survey requesting feedback about your experience at our office. We strive to receive very good responses to the survey questions. If you feel that your experience would prevent you from giving the office a very good  response, please contact our office to try to remedy the situation. We may be reached at [phone]. Thank you for taking the time out of your busy day to complete the survey.  Venetia Potters, MD [facility]  Preventing Falls at [facility] are common, often dreaded events in the lives of older people. Aside from the obvious injuries and even death that may result, fall can cause wide-ranging consequences including loss of independence, mental decline, decreased activity and mobility. Younger people are also at risk of falling, especially those with chronic illnesses and fatigue.  Ways to reduce risk for falling Examine diet and medications. Warm foods and alcohol dilate blood vessels, which can lead to dizziness when standing. Sleep aids, antidepressants and pain medications can also increase the likelihood of a fall.  Get a vision exam. Poor vision, cataracts and glaucoma increase the chances of  falling.  Check foot gear. Shoes should fit snugly and have a sturdy, nonskid sole and a broad, low heel  Participate in a physician-approved exercise program to build and maintain muscle strength and improve balance and coordination. Programs that use ankle weights or stretch bands are excellent for muscle-strengthening. Water aerobics programs and low-impact Tai Chi programs have also been shown to improve balance and coordination.  Increase vitamin D  intake. Vitamin D  improves muscle strength and increases the amount of calcium  the body is able to absorb and deposit in bones.  How to prevent falls from common hazards Floors - Remove all loose wires, cords, and throw rugs. Minimize clutter. Make sure rugs are anchored and smooth. Keep furniture in its usual place.  Chairs -- Use chairs with straight backs, armrests and firm seats. Add firm cushions to existing pieces to add height.  Bathroom - Install grab bars and non-skid tape in the tub or shower. Use a bathtub transfer bench or a shower chair with a back support Use an elevated toilet seat and/or safety rails to assist standing from a low surface. Do not use towel racks or bathroom tissue holders to help you stand.  Lighting - Make sure halls, stairways, and entrances are well-lit. Install a night light in your bathroom or hallway. Make sure there is a light switch at the top and bottom of the staircase. Turn lights on if you get up in the middle of the night. Make sure lamps or light switches are within reach of the bed if you have to  get up during the night.  Kitchen - Install non-skid rubber mats near the sink and stove. Clean spills immediately. Store frequently used utensils, pots, pans between waist and eye level. This helps prevent reaching and bending. Sit when getting things out of lower cupboards.  Living room/ Bedrooms - Place furniture with wide spaces in between, giving enough room to move around. Establish a route through the  living room that gives you something to hold onto as you walk.  Stairs - Make sure treads, rails, and rugs are secure. Install a rail on both sides of the stairs. If stairs are a threat, it might be helpful to arrange most of your activities on the lower level to reduce the number of times you must climb the stairs.  Entrances and doorways - Install metal handles on the walls adjacent to the doorknobs of all doors to make it more secure as you travel through the doorway.  Tips for maintaining balance Keep at least one hand free at all times. Try using a backpack or fanny pack to hold things rather than carrying them in your hands. Never carry objects in both hands when walking as this interferes with keeping your balance.  Attempt to swing both arms from front to back while walking. This might require a conscious effort if Parkinson's disease has diminished your movement. It will, however, help you to maintain balance and posture, and reduce fatigue.  Consciously lift your feet off of the ground when walking. Shuffling and dragging of the feet is a common culprit in losing your balance.  When trying to navigate turns, use a U technique of facing forward and making a wide turn, rather than pivoting sharply.  Try to stand with your feet shoulder-length apart. When your feet are close together for any length of time, you increase your risk of losing your balance and falling.  Do one thing at a time. Don't try to walk and accomplish another task, such as reading or looking around. The decrease in your automatic reflexes complicates motor function, so the less distraction, the better.  Do not wear rubber or gripping soled shoes, they might catch on the floor and cause tripping.  Move slowly when changing positions. Use deliberate, concentrated movements and, if needed, use a grab bar or walking aid. Count 15 seconds between each movement. For example, when rising from a seated position, wait 15  seconds after standing to begin walking.  If balance is a continuous problem, you might want to consider a walking aid such as a cane, walking stick, or walker. Once you've mastered walking with help, you might be ready to try it on your own again.

## 2025-01-31 ENCOUNTER — Ambulatory Visit: Admitting: Neurology

## 2025-07-05 ENCOUNTER — Ambulatory Visit: Payer: Self-pay | Admitting: Neurology
